# Patient Record
Sex: Female | Born: 1945
Health system: Southern US, Community
[De-identification: ages and names within clinical notes are randomized; demographics above are authoritative.]

## PROBLEM LIST (undated history)

## (undated) DIAGNOSIS — I10 Essential (primary) hypertension: Secondary | ICD-10-CM

## (undated) DIAGNOSIS — E785 Hyperlipidemia, unspecified: Secondary | ICD-10-CM

## (undated) DIAGNOSIS — Z923 Personal history of irradiation: Secondary | ICD-10-CM

## (undated) DIAGNOSIS — T4145XA Adverse effect of unspecified anesthetic, initial encounter: Secondary | ICD-10-CM

## (undated) DIAGNOSIS — T8859XA Other complications of anesthesia, initial encounter: Secondary | ICD-10-CM

## (undated) DIAGNOSIS — R112 Nausea with vomiting, unspecified: Secondary | ICD-10-CM

## (undated) DIAGNOSIS — C419 Malignant neoplasm of bone and articular cartilage, unspecified: Secondary | ICD-10-CM

## (undated) DIAGNOSIS — Z9889 Other specified postprocedural states: Secondary | ICD-10-CM

## (undated) DIAGNOSIS — E039 Hypothyroidism, unspecified: Secondary | ICD-10-CM

## (undated) DIAGNOSIS — C50919 Malignant neoplasm of unspecified site of unspecified female breast: Secondary | ICD-10-CM

## (undated) HISTORY — PX: BREAST BIOPSY: SHX20

## (undated) HISTORY — DX: Malignant neoplasm of bone and articular cartilage, unspecified: C41.9

## (undated) HISTORY — DX: Malignant neoplasm of unspecified site of unspecified female breast: C50.919

## (undated) HISTORY — PX: FOOT SURGERY: SHX648

## (undated) HISTORY — DX: Hyperlipidemia, unspecified: E78.5

## (undated) HISTORY — PX: BACK SURGERY: SHX140

---

## 2004-05-02 ENCOUNTER — Ambulatory Visit: Payer: Self-pay | Admitting: Unknown Physician Specialty

## 2004-05-23 ENCOUNTER — Ambulatory Visit: Payer: Self-pay | Admitting: Unknown Physician Specialty

## 2005-07-11 ENCOUNTER — Ambulatory Visit: Payer: Self-pay | Admitting: Unknown Physician Specialty

## 2005-08-07 ENCOUNTER — Ambulatory Visit: Payer: Self-pay | Admitting: Podiatry

## 2006-01-17 ENCOUNTER — Ambulatory Visit: Payer: Self-pay | Admitting: Podiatry

## 2006-07-15 ENCOUNTER — Ambulatory Visit: Payer: Self-pay | Admitting: Unknown Physician Specialty

## 2007-07-22 ENCOUNTER — Ambulatory Visit: Payer: Self-pay | Admitting: Unknown Physician Specialty

## 2008-08-02 ENCOUNTER — Ambulatory Visit: Payer: Self-pay | Admitting: Unknown Physician Specialty

## 2009-08-23 ENCOUNTER — Ambulatory Visit: Payer: Self-pay | Admitting: Unknown Physician Specialty

## 2010-08-06 ENCOUNTER — Ambulatory Visit: Payer: Self-pay | Admitting: Unknown Physician Specialty

## 2010-08-28 ENCOUNTER — Ambulatory Visit: Payer: Self-pay | Admitting: Unknown Physician Specialty

## 2011-08-29 ENCOUNTER — Ambulatory Visit: Payer: Self-pay | Admitting: Internal Medicine

## 2011-09-18 ENCOUNTER — Ambulatory Visit: Payer: Self-pay | Admitting: Unknown Physician Specialty

## 2011-09-20 LAB — PATHOLOGY REPORT

## 2012-08-31 ENCOUNTER — Ambulatory Visit: Payer: Self-pay | Admitting: Internal Medicine

## 2013-09-01 ENCOUNTER — Ambulatory Visit: Payer: Self-pay | Admitting: Internal Medicine

## 2013-10-06 ENCOUNTER — Ambulatory Visit: Payer: Self-pay | Admitting: Unknown Physician Specialty

## 2013-10-08 LAB — PATHOLOGY REPORT

## 2014-01-24 DIAGNOSIS — Z8739 Personal history of other diseases of the musculoskeletal system and connective tissue: Secondary | ICD-10-CM | POA: Insufficient documentation

## 2014-01-24 DIAGNOSIS — R1013 Epigastric pain: Secondary | ICD-10-CM | POA: Insufficient documentation

## 2014-07-29 ENCOUNTER — Other Ambulatory Visit: Payer: Self-pay | Admitting: Internal Medicine

## 2014-07-29 DIAGNOSIS — Z1231 Encounter for screening mammogram for malignant neoplasm of breast: Secondary | ICD-10-CM

## 2014-09-05 ENCOUNTER — Ambulatory Visit
Admission: RE | Admit: 2014-09-05 | Discharge: 2014-09-05 | Disposition: A | Discharge status: Home / Self Care | Payer: PPO | Source: Ambulatory Visit | Attending: Internal Medicine | Admitting: Internal Medicine

## 2014-09-05 DIAGNOSIS — Z1231 Encounter for screening mammogram for malignant neoplasm of breast: Secondary | ICD-10-CM | POA: Insufficient documentation

## 2015-07-27 ENCOUNTER — Other Ambulatory Visit: Payer: Self-pay | Admitting: Internal Medicine

## 2015-07-27 DIAGNOSIS — Z1231 Encounter for screening mammogram for malignant neoplasm of breast: Secondary | ICD-10-CM

## 2015-07-31 DIAGNOSIS — Z7982 Long term (current) use of aspirin: Secondary | ICD-10-CM | POA: Diagnosis not present

## 2015-07-31 DIAGNOSIS — E559 Vitamin D deficiency, unspecified: Secondary | ICD-10-CM | POA: Diagnosis not present

## 2015-07-31 DIAGNOSIS — R1013 Epigastric pain: Secondary | ICD-10-CM | POA: Diagnosis not present

## 2015-07-31 DIAGNOSIS — I10 Essential (primary) hypertension: Secondary | ICD-10-CM | POA: Diagnosis not present

## 2015-07-31 DIAGNOSIS — Z8739 Personal history of other diseases of the musculoskeletal system and connective tissue: Secondary | ICD-10-CM | POA: Diagnosis not present

## 2015-08-07 DIAGNOSIS — Z780 Asymptomatic menopausal state: Secondary | ICD-10-CM | POA: Diagnosis not present

## 2015-08-07 DIAGNOSIS — R946 Abnormal results of thyroid function studies: Secondary | ICD-10-CM | POA: Diagnosis not present

## 2015-08-07 DIAGNOSIS — E784 Other hyperlipidemia: Secondary | ICD-10-CM | POA: Diagnosis not present

## 2015-08-07 DIAGNOSIS — E559 Vitamin D deficiency, unspecified: Secondary | ICD-10-CM | POA: Diagnosis not present

## 2015-08-07 DIAGNOSIS — J3089 Other allergic rhinitis: Secondary | ICD-10-CM | POA: Diagnosis not present

## 2015-08-07 DIAGNOSIS — R1013 Epigastric pain: Secondary | ICD-10-CM | POA: Diagnosis not present

## 2015-08-07 DIAGNOSIS — Z7982 Long term (current) use of aspirin: Secondary | ICD-10-CM | POA: Diagnosis not present

## 2015-08-07 DIAGNOSIS — I10 Essential (primary) hypertension: Secondary | ICD-10-CM | POA: Diagnosis not present

## 2015-08-28 DIAGNOSIS — R946 Abnormal results of thyroid function studies: Secondary | ICD-10-CM | POA: Diagnosis not present

## 2015-08-28 DIAGNOSIS — Z780 Asymptomatic menopausal state: Secondary | ICD-10-CM | POA: Diagnosis not present

## 2015-08-28 DIAGNOSIS — I10 Essential (primary) hypertension: Secondary | ICD-10-CM | POA: Diagnosis not present

## 2015-09-11 ENCOUNTER — Other Ambulatory Visit: Payer: Self-pay | Admitting: Internal Medicine

## 2015-09-11 ENCOUNTER — Ambulatory Visit
Admission: RE | Admit: 2015-09-11 | Discharge: 2015-09-11 | Disposition: A | Discharge status: Home / Self Care | Payer: PPO | Source: Ambulatory Visit | Attending: Internal Medicine | Admitting: Internal Medicine

## 2015-09-11 DIAGNOSIS — Z1231 Encounter for screening mammogram for malignant neoplasm of breast: Secondary | ICD-10-CM

## 2015-09-26 DIAGNOSIS — H40153 Residual stage of open-angle glaucoma, bilateral: Secondary | ICD-10-CM | POA: Diagnosis not present

## 2016-01-26 DIAGNOSIS — D2271 Melanocytic nevi of right lower limb, including hip: Secondary | ICD-10-CM | POA: Diagnosis not present

## 2016-01-29 DIAGNOSIS — I10 Essential (primary) hypertension: Secondary | ICD-10-CM | POA: Diagnosis not present

## 2016-01-29 DIAGNOSIS — Z7982 Long term (current) use of aspirin: Secondary | ICD-10-CM | POA: Diagnosis not present

## 2016-02-05 DIAGNOSIS — E559 Vitamin D deficiency, unspecified: Secondary | ICD-10-CM | POA: Diagnosis not present

## 2016-02-05 DIAGNOSIS — Z0000 Encounter for general adult medical examination without abnormal findings: Secondary | ICD-10-CM | POA: Diagnosis not present

## 2016-02-05 DIAGNOSIS — R946 Abnormal results of thyroid function studies: Secondary | ICD-10-CM | POA: Diagnosis not present

## 2016-02-05 DIAGNOSIS — R1013 Epigastric pain: Secondary | ICD-10-CM | POA: Diagnosis not present

## 2016-02-05 DIAGNOSIS — E784 Other hyperlipidemia: Secondary | ICD-10-CM | POA: Diagnosis not present

## 2016-02-05 DIAGNOSIS — Z7982 Long term (current) use of aspirin: Secondary | ICD-10-CM | POA: Diagnosis not present

## 2016-02-05 DIAGNOSIS — I10 Essential (primary) hypertension: Secondary | ICD-10-CM | POA: Diagnosis not present

## 2016-02-12 DIAGNOSIS — H40153 Residual stage of open-angle glaucoma, bilateral: Secondary | ICD-10-CM | POA: Diagnosis not present

## 2016-03-05 DIAGNOSIS — R946 Abnormal results of thyroid function studies: Secondary | ICD-10-CM | POA: Diagnosis not present

## 2016-03-06 DIAGNOSIS — E034 Atrophy of thyroid (acquired): Secondary | ICD-10-CM | POA: Insufficient documentation

## 2016-06-10 DIAGNOSIS — H40153 Residual stage of open-angle glaucoma, bilateral: Secondary | ICD-10-CM | POA: Diagnosis not present

## 2016-06-10 DIAGNOSIS — H25813 Combined forms of age-related cataract, bilateral: Secondary | ICD-10-CM | POA: Diagnosis not present

## 2016-08-05 DIAGNOSIS — R1013 Epigastric pain: Secondary | ICD-10-CM | POA: Diagnosis not present

## 2016-08-05 DIAGNOSIS — Z7982 Long term (current) use of aspirin: Secondary | ICD-10-CM | POA: Diagnosis not present

## 2016-08-05 DIAGNOSIS — I10 Essential (primary) hypertension: Secondary | ICD-10-CM | POA: Diagnosis not present

## 2016-08-05 DIAGNOSIS — E559 Vitamin D deficiency, unspecified: Secondary | ICD-10-CM | POA: Diagnosis not present

## 2016-08-12 ENCOUNTER — Other Ambulatory Visit: Payer: Self-pay | Admitting: Internal Medicine

## 2016-08-12 DIAGNOSIS — Z23 Encounter for immunization: Secondary | ICD-10-CM | POA: Diagnosis not present

## 2016-08-12 DIAGNOSIS — E559 Vitamin D deficiency, unspecified: Secondary | ICD-10-CM | POA: Diagnosis not present

## 2016-08-12 DIAGNOSIS — E784 Other hyperlipidemia: Secondary | ICD-10-CM | POA: Diagnosis not present

## 2016-08-12 DIAGNOSIS — Z0000 Encounter for general adult medical examination without abnormal findings: Secondary | ICD-10-CM | POA: Diagnosis not present

## 2016-08-12 DIAGNOSIS — E034 Atrophy of thyroid (acquired): Secondary | ICD-10-CM | POA: Diagnosis not present

## 2016-08-12 DIAGNOSIS — I10 Essential (primary) hypertension: Secondary | ICD-10-CM | POA: Diagnosis not present

## 2016-08-12 DIAGNOSIS — Z7982 Long term (current) use of aspirin: Secondary | ICD-10-CM | POA: Diagnosis not present

## 2016-08-12 DIAGNOSIS — R1013 Epigastric pain: Secondary | ICD-10-CM | POA: Diagnosis not present

## 2016-08-12 DIAGNOSIS — Z1231 Encounter for screening mammogram for malignant neoplasm of breast: Secondary | ICD-10-CM | POA: Diagnosis not present

## 2016-09-11 ENCOUNTER — Ambulatory Visit
Admission: RE | Admit: 2016-09-11 | Discharge: 2016-09-11 | Disposition: A | Discharge status: Home / Self Care | Payer: PPO | Source: Ambulatory Visit | Attending: Internal Medicine | Admitting: Internal Medicine

## 2016-09-11 DIAGNOSIS — Z1231 Encounter for screening mammogram for malignant neoplasm of breast: Secondary | ICD-10-CM | POA: Diagnosis not present

## 2016-09-12 ENCOUNTER — Ambulatory Visit: Payer: PPO

## 2016-10-21 DIAGNOSIS — H40153 Residual stage of open-angle glaucoma, bilateral: Secondary | ICD-10-CM | POA: Diagnosis not present

## 2017-01-31 DIAGNOSIS — D2271 Melanocytic nevi of right lower limb, including hip: Secondary | ICD-10-CM | POA: Diagnosis not present

## 2017-01-31 DIAGNOSIS — D225 Melanocytic nevi of trunk: Secondary | ICD-10-CM | POA: Diagnosis not present

## 2017-01-31 DIAGNOSIS — D2261 Melanocytic nevi of right upper limb, including shoulder: Secondary | ICD-10-CM | POA: Diagnosis not present

## 2017-01-31 DIAGNOSIS — L821 Other seborrheic keratosis: Secondary | ICD-10-CM | POA: Diagnosis not present

## 2017-02-11 DIAGNOSIS — I10 Essential (primary) hypertension: Secondary | ICD-10-CM | POA: Diagnosis not present

## 2017-02-11 DIAGNOSIS — E034 Atrophy of thyroid (acquired): Secondary | ICD-10-CM | POA: Diagnosis not present

## 2017-02-11 DIAGNOSIS — E7849 Other hyperlipidemia: Secondary | ICD-10-CM | POA: Diagnosis not present

## 2017-02-11 DIAGNOSIS — Z0000 Encounter for general adult medical examination without abnormal findings: Secondary | ICD-10-CM | POA: Diagnosis not present

## 2017-02-11 DIAGNOSIS — R1013 Epigastric pain: Secondary | ICD-10-CM | POA: Diagnosis not present

## 2017-02-11 DIAGNOSIS — Z7982 Long term (current) use of aspirin: Secondary | ICD-10-CM | POA: Diagnosis not present

## 2017-02-18 DIAGNOSIS — E034 Atrophy of thyroid (acquired): Secondary | ICD-10-CM | POA: Diagnosis not present

## 2017-02-18 DIAGNOSIS — Z8739 Personal history of other diseases of the musculoskeletal system and connective tissue: Secondary | ICD-10-CM | POA: Diagnosis not present

## 2017-02-18 DIAGNOSIS — E7849 Other hyperlipidemia: Secondary | ICD-10-CM | POA: Diagnosis not present

## 2017-02-18 DIAGNOSIS — E559 Vitamin D deficiency, unspecified: Secondary | ICD-10-CM | POA: Diagnosis not present

## 2017-02-18 DIAGNOSIS — R1013 Epigastric pain: Secondary | ICD-10-CM | POA: Diagnosis not present

## 2017-02-18 DIAGNOSIS — I10 Essential (primary) hypertension: Secondary | ICD-10-CM | POA: Diagnosis not present

## 2017-04-15 DIAGNOSIS — H40153 Residual stage of open-angle glaucoma, bilateral: Secondary | ICD-10-CM | POA: Diagnosis not present

## 2017-07-22 DIAGNOSIS — H40153 Residual stage of open-angle glaucoma, bilateral: Secondary | ICD-10-CM | POA: Diagnosis not present

## 2017-08-19 ENCOUNTER — Other Ambulatory Visit: Payer: Self-pay | Admitting: Internal Medicine

## 2017-08-19 DIAGNOSIS — I10 Essential (primary) hypertension: Secondary | ICD-10-CM | POA: Diagnosis not present

## 2017-08-19 DIAGNOSIS — E7849 Other hyperlipidemia: Secondary | ICD-10-CM | POA: Diagnosis not present

## 2017-08-19 DIAGNOSIS — Z1231 Encounter for screening mammogram for malignant neoplasm of breast: Secondary | ICD-10-CM

## 2017-08-19 DIAGNOSIS — E034 Atrophy of thyroid (acquired): Secondary | ICD-10-CM | POA: Diagnosis not present

## 2017-08-19 DIAGNOSIS — Z8739 Personal history of other diseases of the musculoskeletal system and connective tissue: Secondary | ICD-10-CM | POA: Diagnosis not present

## 2017-08-19 DIAGNOSIS — R1013 Epigastric pain: Secondary | ICD-10-CM | POA: Diagnosis not present

## 2017-08-19 DIAGNOSIS — E559 Vitamin D deficiency, unspecified: Secondary | ICD-10-CM | POA: Diagnosis not present

## 2017-08-26 DIAGNOSIS — I10 Essential (primary) hypertension: Secondary | ICD-10-CM | POA: Diagnosis not present

## 2017-08-26 DIAGNOSIS — Z8739 Personal history of other diseases of the musculoskeletal system and connective tissue: Secondary | ICD-10-CM | POA: Diagnosis not present

## 2017-08-26 DIAGNOSIS — E7849 Other hyperlipidemia: Secondary | ICD-10-CM | POA: Diagnosis not present

## 2017-08-26 DIAGNOSIS — E034 Atrophy of thyroid (acquired): Secondary | ICD-10-CM | POA: Diagnosis not present

## 2017-08-26 DIAGNOSIS — F3342 Major depressive disorder, recurrent, in full remission: Secondary | ICD-10-CM | POA: Insufficient documentation

## 2017-08-26 DIAGNOSIS — Z0000 Encounter for general adult medical examination without abnormal findings: Secondary | ICD-10-CM | POA: Diagnosis not present

## 2017-08-26 DIAGNOSIS — Z780 Asymptomatic menopausal state: Secondary | ICD-10-CM | POA: Diagnosis not present

## 2017-08-26 DIAGNOSIS — R1013 Epigastric pain: Secondary | ICD-10-CM | POA: Diagnosis not present

## 2017-08-26 DIAGNOSIS — R748 Abnormal levels of other serum enzymes: Secondary | ICD-10-CM | POA: Diagnosis not present

## 2017-08-26 DIAGNOSIS — E559 Vitamin D deficiency, unspecified: Secondary | ICD-10-CM | POA: Diagnosis not present

## 2017-09-05 DIAGNOSIS — E559 Vitamin D deficiency, unspecified: Secondary | ICD-10-CM | POA: Diagnosis not present

## 2017-09-23 DIAGNOSIS — R748 Abnormal levels of other serum enzymes: Secondary | ICD-10-CM | POA: Diagnosis not present

## 2017-09-24 ENCOUNTER — Other Ambulatory Visit: Payer: Self-pay | Admitting: Internal Medicine

## 2017-09-24 DIAGNOSIS — R748 Abnormal levels of other serum enzymes: Secondary | ICD-10-CM

## 2017-10-03 ENCOUNTER — Ambulatory Visit: Payer: PPO

## 2017-10-06 ENCOUNTER — Ambulatory Visit: Payer: PPO

## 2017-10-06 ENCOUNTER — Ambulatory Visit
Admission: RE | Admit: 2017-10-06 | Discharge: 2017-10-06 | Disposition: A | Discharge status: Home / Self Care | Payer: PPO | Source: Ambulatory Visit | Attending: Internal Medicine | Admitting: Internal Medicine

## 2017-10-06 DIAGNOSIS — R748 Abnormal levels of other serum enzymes: Secondary | ICD-10-CM | POA: Diagnosis not present

## 2017-10-06 DIAGNOSIS — K760 Fatty (change of) liver, not elsewhere classified: Secondary | ICD-10-CM | POA: Insufficient documentation

## 2017-10-07 DIAGNOSIS — K760 Fatty (change of) liver, not elsewhere classified: Secondary | ICD-10-CM | POA: Insufficient documentation

## 2017-10-28 DIAGNOSIS — R748 Abnormal levels of other serum enzymes: Secondary | ICD-10-CM | POA: Diagnosis not present

## 2017-11-03 ENCOUNTER — Ambulatory Visit
Admission: RE | Admit: 2017-11-03 | Discharge: 2017-11-03 | Disposition: A | Discharge status: Home / Self Care | Payer: PPO | Source: Ambulatory Visit | Attending: Internal Medicine | Admitting: Internal Medicine

## 2017-11-03 DIAGNOSIS — Z1231 Encounter for screening mammogram for malignant neoplasm of breast: Secondary | ICD-10-CM | POA: Diagnosis not present

## 2017-11-06 ENCOUNTER — Other Ambulatory Visit: Payer: Self-pay | Admitting: Internal Medicine

## 2017-11-06 DIAGNOSIS — N6320 Unspecified lump in the left breast, unspecified quadrant: Secondary | ICD-10-CM

## 2017-11-06 DIAGNOSIS — R928 Other abnormal and inconclusive findings on diagnostic imaging of breast: Secondary | ICD-10-CM

## 2017-11-17 ENCOUNTER — Ambulatory Visit
Admission: RE | Admit: 2017-11-17 | Discharge: 2017-11-17 | Disposition: A | Discharge status: Home / Self Care | Payer: PPO | Source: Ambulatory Visit | Attending: Internal Medicine | Admitting: Internal Medicine

## 2017-11-17 DIAGNOSIS — R928 Other abnormal and inconclusive findings on diagnostic imaging of breast: Secondary | ICD-10-CM | POA: Insufficient documentation

## 2017-11-17 DIAGNOSIS — N6489 Other specified disorders of breast: Secondary | ICD-10-CM | POA: Diagnosis not present

## 2017-11-17 DIAGNOSIS — N6320 Unspecified lump in the left breast, unspecified quadrant: Secondary | ICD-10-CM | POA: Diagnosis not present

## 2017-11-19 ENCOUNTER — Other Ambulatory Visit: Payer: Self-pay | Admitting: Internal Medicine

## 2017-11-19 DIAGNOSIS — N6320 Unspecified lump in the left breast, unspecified quadrant: Secondary | ICD-10-CM

## 2017-11-19 DIAGNOSIS — R928 Other abnormal and inconclusive findings on diagnostic imaging of breast: Secondary | ICD-10-CM

## 2017-11-25 DIAGNOSIS — H40153 Residual stage of open-angle glaucoma, bilateral: Secondary | ICD-10-CM | POA: Diagnosis not present

## 2017-11-27 ENCOUNTER — Ambulatory Visit
Admission: RE | Admit: 2017-11-27 | Discharge: 2017-11-27 | Disposition: A | Discharge status: Home / Self Care | Payer: PPO | Source: Ambulatory Visit | Attending: Internal Medicine | Admitting: Internal Medicine

## 2017-11-27 DIAGNOSIS — N6321 Unspecified lump in the left breast, upper outer quadrant: Secondary | ICD-10-CM | POA: Diagnosis not present

## 2017-11-27 DIAGNOSIS — R928 Other abnormal and inconclusive findings on diagnostic imaging of breast: Secondary | ICD-10-CM

## 2017-11-27 DIAGNOSIS — N6320 Unspecified lump in the left breast, unspecified quadrant: Secondary | ICD-10-CM | POA: Diagnosis not present

## 2017-11-27 DIAGNOSIS — C50412 Malignant neoplasm of upper-outer quadrant of left female breast: Secondary | ICD-10-CM | POA: Diagnosis not present

## 2017-12-02 ENCOUNTER — Other Ambulatory Visit: Payer: Self-pay

## 2017-12-02 ENCOUNTER — Other Ambulatory Visit: Payer: Self-pay | Admitting: Anatomic Pathology & Clinical Pathology

## 2017-12-02 DIAGNOSIS — C50412 Malignant neoplasm of upper-outer quadrant of left female breast: Secondary | ICD-10-CM | POA: Diagnosis not present

## 2017-12-02 DIAGNOSIS — C50919 Malignant neoplasm of unspecified site of unspecified female breast: Secondary | ICD-10-CM

## 2017-12-02 LAB — SURGICAL PATHOLOGY

## 2017-12-02 NOTE — Progress Notes (Signed)
  Oncology Nurse Navigator Documentation  Navigator Location: [facility] (12/02/17 1100)   )Navigator Encounter Type: Introductory phone call (12/02/17 1100)   Abnormal Finding Date: 11/17/17 (12/02/17 1100) Confirmed Diagnosis Date: 11/27/17 (12/02/17 1100)               Patient Visit Type: Initial (12/02/17 1100) Treatment Phase: Pre-Tx/Tx Discussion (12/02/17 1100) Barriers/Navigation Needs: Coordination of Care;Education (12/02/17 1100) Education: Accessing Care/ Finding Providers;Coping with Diagnosis/ Prognosis;Newly Diagnosed Cancer Education (12/02/17 1100) Interventions: Coordination of Care;Education (12/02/17 1100)   Coordination of Care: Appts (12/02/17 1100) Education Method: Written;Verbal;Teach-back (12/02/17 1100)                Time Spent with Patient: 60 (12/02/17 1100)  Phoned patient to introduce Navigation Service.  Patient states she has appointment this afternoon with Dr. [name].  Took Breast Cancer Treatment Handbook/folder with hospital services to [facility].  Scheduled with Dr. Janese Banks on 12/05/17 at 1:00.  PAtient ,and Dr. Caryl Comes notified.

## 2017-12-03 ENCOUNTER — Other Ambulatory Visit: Payer: Self-pay | Admitting: Surgery

## 2017-12-03 ENCOUNTER — Ambulatory Visit: Payer: Self-pay | Admitting: Surgery

## 2017-12-03 DIAGNOSIS — C50412 Malignant neoplasm of upper-outer quadrant of left female breast: Secondary | ICD-10-CM

## 2017-12-03 NOTE — H&P (Signed)
Subjective:   CC: Malignant neoplasm of upper-outer quadrant of left female breast, unspecified estrogen receptor status (CMS-HCC) [C50.412] HPI:  Christy Braun is a 72 y.o. female who was referred by Cheryll Cockayne, MD for evaluation of breast mass. Change was noted on previous screening mammogram. Patient does routinely do self breast exams.  Patient has not noted a change on breast exam. Age of menarche was 70. Age of menopause was year ago. Patient reports 2-58motrial of hormonal therapy. Patient is G2P2. Patient did not breast feed. Patient denies nipple discharge. Patient denies previous breast biopsy. Patient denies a personal history of breast cancer.   Past Medical History:  has a past medical history of Adenomatous colon polyp, unspecified (2013), Allergic rhinitis, Dyspepsia (01/24/2014), Elevated liver enzymes (01/24/2014), Fibrocystic breast disease, H/O mammogram (08/28/2010), Hepatic steatosis (10/07/2017), History of bone density study (07/17/2011), Hyperlipidemia, Hypertension, Hypothyroidism due to acquired atrophy of thyroid (03/06/2016), and Vitamin D deficiency.  Past Surgical History:  has a past surgical history that includes Colonoscopy (05/23/2004); Colonoscopy (09/18/2011); egd (09/18/2011); Colonoscopy (10/06/2013); Appendectomy; Tubal ligation; Lumbar laminectomy and right radial keratotomy; and Multiple foot surgeries.  Family History: family history includes Coronary Artery Disease (Blocked arteries around heart) in her father; Hyperlipidemia (Elevated cholesterol) in her mother. PRIMARY OVARIAN CANCER-MOTHER  Social History:  reports that she has quit smoking. She has never used smokeless tobacco. She reports that she drinks alcohol. She reports that she does not use drugs.  Current Medications: has a current medication list which includes the following prescription(s): aspirin, atorvastatin, calcium carbonate-vitamin d3, cholecalciferol, levothyroxine,  losartan-hydrochlorothiazide, azithromycin, citalopram, and latanoprost.  Allergies:       Allergies as of 12/02/2017 - Reviewed 12/02/2017  Allergen Reaction Noted  . Penicillins Swelling and Other (See Comments) 01/18/2014  . Percocet [oxycodone-acetaminophen] Other (See Comments) 01/18/2014    ROS:  A 15 point review of systems was performed and was negative except as noted in HPI   Objective:   BP 134/81   Pulse 104   Temp 37.1 C (98.8 F) (Oral)   Ht 162.6 cm (5' 4")   Wt 63.5 kg (140 lb)   BMI 24.03 kg/m   Constitutional :  alert, appears stated age, cooperative and no distress  Lymphatics/Throat:  no asymmetry, masses, or scars  Respiratory:  clear to auscultation bilaterally  Cardiovascular:  regular rate and rhythm  Gastrointestinal: soft, non-tender; bowel sounds normal; no masses,  no organomegaly.   Musculoskeletal: Steady gait and movement  Skin: Cool and moist, no surgical scars.  Small non-tender nodule noted on back of left thigh, slightly proximal to knee joint, smooth and firm, superficial.  Similar less, firm noudles noted along varicose veins in the lower extremity.  Psychiatric: Normal affect, non-agitated, not confused  Breast:  Chaperone present for exam.  breasts appear normal, no suspicious masses, no skin or nipple changes or axillary nodes.    LABS:  Surgical Pathology CASE: [id number]: MDomenic SchwabSurgical Pathology Report     SPECIMEN SUBMITTED: A. Breast, left, 2 o'clock; biopsy  CLINICAL HISTORY: Screening detected mass left breast at 2 o'clock, 4 mm by ultrasound  PRE-OPERATIVE DIAGNOSIS: None provided  POST-OPERATIVE DIAGNOSIS: None provided.     DIAGNOSIS: A. BREAST, LEFT, 2 O'CLOCK 8 CM FROM NIPPLE; ULTRASOUND-GUIDED CORE BIOPSY: - INVASIVE MAMMARY CARCINOMA WITH FEATURES OF MUCINOUS CARCINOMA.  Size of invasive carcinoma: 4 mm in this sample Histologic grade of invasive carcinoma: Grade  1  Glandular/tubular differentiation score: 3  Nuclear pleomorphism  score: 1  Mitotic rate score: 1  Total score: 5 Ductal carcinoma in situ: Present, low-grade without necrosis Lymphovascular invasion: Not identified  ER/PR/HER2: Immunohistochemistry will be performed on block A1, with reflex to FISH for HER2 2+. The results will be reported in an addendum.  Comment: The definitive tumor classification and grade will be assigned on the excisional specimen. These findings were communicated to Christy Braun in Dr. Dimitrova's office on 11/28/2017 at 1:28 PM. Read back procedure was performed.   GROSS DESCRIPTION: A. Labeled: Left breast 2:00, 8 cm from nipple Received: In formalin Time/date in fixative: 8:58 AM on 11/27/2017 Cold ischemic time: Less than 1 minute Total fixation time: 8 hours Core pieces: Multiple Size: Aggregate, 1.1 x 0.6 x 0.1 cm Description: Yellow to tan fibrous and fatty fragments Ink color: Black Entirely submitted in one cassette.     Final Diagnosis performed by Liannah Olney, MD. Electronically signed 11/28/2017 3:23:29PM The electronic signature indicates that the named Attending Pathologist has evaluated the specimen  Technical component performed at [facility], [address], [place], [place] [address] Lab: [phone] Dir: Sanjai Nagendra, MD, MMMProfessional component performed at [facility], [facility], [address], [place], [place] [address] Lab: [phone] Dir: Tara C. Rubinas, MD  RADS: CLINICAL DATA:Recall from screening mammography with  tomosynthesis, possible mass involving the OUTER LEFT breast at  POSTERIOR depth.    EXAM:  DIGITAL DIAGNOSTIC LEFT MAMMOGRAM WITH TOMO    ULTRASOUND LEFT BREAST    COMPARISON:Mammography 11/03/2017, 09/11/2016 and earlier. No  prior ultrasound.    ACR Breast Density Category b:  There are scattered areas of  fibroglandular density.    FINDINGS:  Tomosynthesis and synthesized spot-compression CC and MLO views of  the area of concern in the LEFT breast were obtained.    Spot compression images confirm a mass measuring approximately 5 mm  in the OUTER breast at POSTERIOR depth, associated with vague  spiculation/architectural distortion. There are no associated  suspicious calcifications.    On physical exam, there is no palpable abnormality in the UPPER  OUTER LEFT breast.    Targeted LEFT breast ultrasound is performed, showing a hypoechoic  mass with irregular margins the 2 o'clock position approximately 8  cm nipple at POSTERIOR depth measuring approximately 3 x 4 x 3 mm,  demonstrating no posterior characteristics and no internal power  Doppler flow, corresponding to the screening mammographic finding.    Sonographic evaluation of the LEFT axilla demonstrates no pathologic  lymphadenopathy.    IMPRESSION:  1. Suspicious 4 mm mass involving the UPPER OUTER QUADRANT of the  LEFT breast at POSTERIOR depth.  2. No pathologic LEFT axillary lymphadenopathy.    RECOMMENDATION:  Ultrasound-guided core needle biopsy of the suspicious LEFT breast  mass.    The ultrasound biopsy procedure was discussed with the patient and  her questions were answered. She has agreed to proceed and the  biopsy will be scheduled at her convenience.    I have discussed the findings and recommendations with the patient.  Results were also provided in writing at the conclusion of the  visit.    BI-RADS CATEGORY4: Suspicious.      Electronically Signed  By: ThomasLawrence M.D.  On: 11/17/2017 16:10   Assessment:   Malignant neoplasm of upper-outer quadrant of left female breast, unspecified estrogen receptor status (CMS-HCC) [C50.412]  Plan:   1. Malignant neoplasm of upper-outer quadrant of left female  breast, unspecified estrogen receptor status (CMS-HCC) [C50.412]  Discussed the risk of   surgery including recurrence, chronic pain, post-op infxn, poor/delayed wound healing, poor cosmesis, seroma, hematoma formation, and possible re-operation to address said risks. The risks of general anesthetic, if used, includes MI, CVA, sudden death or even reaction to anesthetic medications also discussed.  Typical post-op recovery time and possbility of activity restrictions were also discussed.  Alternatives include continued observation.  Benefits include possible symptom relief, pathologic evaluation, and/or curative excision.   The patient verbalized understanding and all questions were answered to the patient's satisfaction.  2. Patient has elected to proceed with surgical treatment. Procedure will be scheduled.  Will await oncology recs for any additional testing, needed prior to surgery.  May need to consider genetic testing due mother's history of ovarian cancer.  Written consent was obtained.     Electronically signed by Benjamine Sprague, DO on 12/02/2017 5:17 PM

## 2017-12-03 NOTE — H&P (View-Only) (Signed)
Subjective:   CC: Malignant neoplasm of upper-outer quadrant of left female breast, unspecified estrogen receptor status (CMS-HCC) [C50.412] HPI:  Christy Braun is a 72 y.o. female who was referred by Christy Cockayne, MD for evaluation of breast mass. Change was noted on previous screening mammogram. Patient does routinely do self breast exams.  Patient has not noted a change on breast exam. Age of menarche was 18. Age of menopause was year ago. Patient reports 2-51motrial of hormonal therapy. Patient is G2P2. Patient did not breast feed. Patient denies nipple discharge. Patient denies previous breast biopsy. Patient denies a personal history of breast cancer.   Past Medical History:  has a past medical history of Adenomatous colon polyp, unspecified (2013), Allergic rhinitis, Dyspepsia (01/24/2014), Elevated liver enzymes (01/24/2014), Fibrocystic breast disease, H/O mammogram (08/28/2010), Hepatic steatosis (10/07/2017), History of bone density study (07/17/2011), Hyperlipidemia, Hypertension, Hypothyroidism due to acquired atrophy of thyroid (03/06/2016), and Vitamin D deficiency.  Past Surgical History:  has a past surgical history that includes Colonoscopy (05/23/2004); Colonoscopy (09/18/2011); egd (09/18/2011); Colonoscopy (10/06/2013); Appendectomy; Tubal ligation; Lumbar laminectomy and right radial keratotomy; and Multiple foot surgeries.  Family History: family history includes Coronary Artery Disease (Blocked arteries around heart) in her father; Hyperlipidemia (Elevated cholesterol) in her mother. PRIMARY OVARIAN CANCER-MOTHER  Social History:  reports that she has quit smoking. She has never used smokeless tobacco. She reports that she drinks alcohol. She reports that she does not use drugs.  Current Medications: has a current medication list which includes the following prescription(s): aspirin, atorvastatin, calcium carbonate-vitamin d3, cholecalciferol, levothyroxine,  losartan-hydrochlorothiazide, azithromycin, citalopram, and latanoprost.  Allergies:       Allergies as of 12/02/2017 - Reviewed 12/02/2017  Allergen Reaction Noted  . Penicillins Swelling and Other (See Comments) 01/18/2014  . Percocet [oxycodone-acetaminophen] Other (See Comments) 01/18/2014    ROS:  A 15 point review of systems was performed and was negative except as noted in HPI   Objective:   BP 134/81   Pulse 104   Temp 37.1 C (98.8 F) (Oral)   Ht 162.6 cm (5' 4")   Wt 63.5 kg (140 lb)   BMI 24.03 kg/m   Constitutional :  alert, appears stated age, cooperative and no distress  Lymphatics/Throat:  no asymmetry, masses, or scars  Respiratory:  clear to auscultation bilaterally  Cardiovascular:  regular rate and rhythm  Gastrointestinal: soft, non-tender; bowel sounds normal; no masses,  no organomegaly.   Musculoskeletal: Steady gait and movement  Skin: Cool and moist, no surgical scars.  Small non-tender nodule noted on back of left thigh, slightly proximal to knee joint, smooth and firm, superficial.  Similar less, firm noudles noted along varicose veins in the lower extremity.  Psychiatric: Normal affect, non-agitated, not confused  Breast:  Chaperone present for exam.  breasts appear normal, no suspicious masses, no skin or nipple changes or axillary nodes.    LABS:  Surgical Pathology CASE: [id number]: Christy SchwabSurgical Pathology Report     SPECIMEN SUBMITTED: A. Breast, left, 2 o'clock; biopsy  CLINICAL HISTORY: Screening detected mass left breast at 2 o'clock, 4 mm by ultrasound  PRE-OPERATIVE DIAGNOSIS: None provided  POST-OPERATIVE DIAGNOSIS: None provided.     DIAGNOSIS: A. BREAST, LEFT, 2 O'CLOCK 8 CM FROM NIPPLE; ULTRASOUND-GUIDED CORE BIOPSY: - INVASIVE MAMMARY CARCINOMA WITH FEATURES OF MUCINOUS CARCINOMA.  Size of invasive carcinoma: 4 mm in this sample Histologic grade of invasive carcinoma: Grade  1  Glandular/tubular differentiation score: 3  Nuclear pleomorphism  score: 1  Mitotic rate score: 1  Total score: 5 Ductal carcinoma in situ: Present, low-grade without necrosis Lymphovascular invasion: Not identified  ER/PR/Braun: Immunohistochemistry will be performed on block A1, with reflex to Christy Stanwood for Braun 2+. The results will be reported in an addendum.  Comment: The definitive tumor classification and grade will be assigned on the excisional specimen. These findings were communicated to Christy Braun in Christy Braun office on 11/28/2017 at 1:28 PM. Read back procedure was performed.   GROSS DESCRIPTION: A. Labeled: Left breast 2:00, 8 cm from nipple Received: In formalin Time/date in fixative: 8:58 AM on 11/27/2017 Cold ischemic time: Less than 1 minute Total fixation time: 8 hours Core pieces: Multiple Size: Aggregate, 1.1 x 0.6 x 0.1 cm Description: Yellow to tan fibrous and fatty fragments Ink color: Black Entirely submitted in one cassette.     Final Diagnosis performed by Christy Lemma, MD. Electronically signed 11/28/2017 3:23:29PM The electronic signature indicates that the named Christy Braun has evaluated the specimen  Technical component performed at [facility], [address] [place], [place] [address] Lab: [phone] Dir: Rush Farmer, MD, MMMProfessional component performed at [facility], [facility], [place], [place], [place] [address] Lab: [phone] Dir: Dellia Nims. Rubinas, MD  RADS: CLINICAL DATA:Recall from screening mammography with  tomosynthesis, possible mass involving the OUTER LEFT breast at  POSTERIOR depth.    EXAM:  DIGITAL DIAGNOSTIC LEFT MAMMOGRAM WITH TOMO    ULTRASOUND LEFT BREAST    COMPARISON:Mammography 11/03/2017, 09/11/2016 and earlier. No  prior ultrasound.    ACR Breast Density Category b:  There are scattered areas of  fibroglandular density.    FINDINGS:  Tomosynthesis and synthesized spot-compression CC and MLO views of  the area of concern in the LEFT breast were obtained.    Spot compression images confirm a mass measuring approximately 5 mm  in the OUTER breast at POSTERIOR depth, associated with vague  spiculation/architectural distortion. There are no associated  suspicious calcifications.    On physical exam, there is no palpable abnormality in the UPPER  OUTER LEFT breast.    Targeted LEFT breast ultrasound is performed, showing a hypoechoic  mass with irregular margins the 2 o'clock position approximately 8  cm nipple at POSTERIOR depth measuring approximately 3 x 4 x 3 mm,  demonstrating no posterior characteristics and no internal power  Doppler flow, corresponding to the screening mammographic finding.    Sonographic evaluation of the LEFT axilla demonstrates no pathologic  lymphadenopathy.    IMPRESSION:  1. Suspicious 4 mm mass involving the UPPER OUTER QUADRANT of the  LEFT breast at POSTERIOR depth.  2. No pathologic LEFT axillary lymphadenopathy.    RECOMMENDATION:  Ultrasound-guided core needle biopsy of the suspicious LEFT breast  mass.    The ultrasound biopsy procedure was discussed with the patient and  her questions were answered. She has agreed to proceed and the  biopsy will be scheduled at her convenience.    I have discussed the findings and recommendations with the patient.  Results were also provided in writing at the conclusion of the  visit.    BI-RADS CATEGORY4: Suspicious.      Electronically Signed  ByHaydee Monica M.D.  On: 11/17/2017 16:10   Assessment:   Malignant neoplasm of upper-outer quadrant of left female breast, unspecified estrogen receptor status (CMS-HCC) [C50.412]  Plan:   1. Malignant neoplasm of upper-outer quadrant of left female  breast, unspecified estrogen receptor status (CMS-HCC) [C50.412]  Discussed the risk of  surgery including recurrence, chronic pain, post-op infxn, poor/delayed wound healing, poor cosmesis, seroma, hematoma formation, and possible re-operation to address said risks. The risks of general anesthetic, if used, includes MI, CVA, sudden death or even reaction to anesthetic medications also discussed.  Typical post-op recovery time and possbility of activity restrictions were also discussed.  Alternatives include continued observation.  Benefits include possible symptom relief, pathologic evaluation, and/or curative excision.   The patient verbalized understanding and all questions were answered to the patient's satisfaction.  2. Patient has elected to proceed with surgical treatment. Procedure will be scheduled.  Will await oncology recs for any additional testing, needed prior to surgery.  May need to consider genetic testing due mother's history of ovarian cancer.  Written consent was obtained.     Electronically signed by Benjamine Sprague, DO on 12/02/2017 5:17 PM

## 2017-12-04 ENCOUNTER — Other Ambulatory Visit: Payer: Self-pay | Admitting: Surgery

## 2017-12-04 DIAGNOSIS — C50412 Malignant neoplasm of upper-outer quadrant of left female breast: Secondary | ICD-10-CM

## 2017-12-05 ENCOUNTER — Encounter: Payer: Self-pay | Admitting: Oncology

## 2017-12-05 ENCOUNTER — Inpatient Hospital Stay: Payer: PPO | Attending: Oncology | Admitting: Oncology

## 2017-12-05 ENCOUNTER — Encounter (INDEPENDENT_AMBULATORY_CARE_PROVIDER_SITE_OTHER): Payer: Self-pay

## 2017-12-05 VITALS — BP 92/48 | HR 101 | Temp 100.3°F | Resp 18 | Ht 65.0 in | Wt 140.5 lb

## 2017-12-05 DIAGNOSIS — Z7189 Other specified counseling: Secondary | ICD-10-CM

## 2017-12-05 DIAGNOSIS — E785 Hyperlipidemia, unspecified: Secondary | ICD-10-CM | POA: Insufficient documentation

## 2017-12-05 DIAGNOSIS — J309 Allergic rhinitis, unspecified: Secondary | ICD-10-CM | POA: Insufficient documentation

## 2017-12-05 DIAGNOSIS — C50919 Malignant neoplasm of unspecified site of unspecified female breast: Secondary | ICD-10-CM

## 2017-12-05 DIAGNOSIS — Z170 Estrogen receptor positive status [ER+]: Secondary | ICD-10-CM | POA: Diagnosis not present

## 2017-12-05 DIAGNOSIS — Z7982 Long term (current) use of aspirin: Secondary | ICD-10-CM

## 2017-12-05 DIAGNOSIS — N6019 Diffuse cystic mastopathy of unspecified breast: Secondary | ICD-10-CM | POA: Insufficient documentation

## 2017-12-05 DIAGNOSIS — C50911 Malignant neoplasm of unspecified site of right female breast: Secondary | ICD-10-CM | POA: Diagnosis not present

## 2017-12-05 DIAGNOSIS — I10 Essential (primary) hypertension: Secondary | ICD-10-CM | POA: Insufficient documentation

## 2017-12-05 DIAGNOSIS — E559 Vitamin D deficiency, unspecified: Secondary | ICD-10-CM | POA: Insufficient documentation

## 2017-12-05 NOTE — Progress Notes (Signed)
Hematology/Oncology Consult note [facility] [phone] [phone]  Patient Care Team: Adin Hector, MD as PCP - General (Internal Medicine)   Name of the patient: Christy Braun  [id number]  [birth year]    Reason for referral- new diagnosis of breast cancer   Referring physician- Dr. Caryl Comes  Date of visit: 12/05/17   History of presenting illness-patient is a 72 year old female who underwent Bilateral screening mammogram in August 2019 which showed a possible mass in the left breast.  Diagnostic mammogram and ultrasound of the left breast showed 3 x 4 x 3 mm hypoechoic mass at the 2 o'clock position.  No pathologic left axillary adenopathy.  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  Patient has seen Dr. [name] and will be undergoing lumpectomy soon.  No prior history of breast cancer or breast biopsies.  Menarche at the age of 64.  Menopause was close to 10 years ago.  She is G2, P2.  She did not breast-feed.  No family history of breast, colon, pancreatic cancer.  States that her mother had some cancer and she is not sure if it was ovarian cancer   ECOG PS- 1  Pain scale- 0   Review of systems- Review of Systems  Constitutional: Negative for chills, fever, malaise/fatigue and weight loss.  HENT: Negative for congestion, ear discharge and nosebleeds.   Eyes: Negative for blurred vision.  Respiratory: Negative for cough, hemoptysis, sputum production, shortness of breath and wheezing.   Cardiovascular: Negative for chest pain, palpitations, orthopnea and claudication.  Gastrointestinal: Negative for abdominal pain, blood in stool, constipation, diarrhea, heartburn, melena, nausea and vomiting.  Genitourinary: Negative for dysuria, flank pain, frequency, hematuria and urgency.  Musculoskeletal: Negative for back pain, joint  pain and myalgias.  Skin: Negative for rash.  Neurological: Negative for dizziness, tingling, focal weakness, seizures, weakness and headaches.  Endo/Heme/Allergies: Does not bruise/bleed easily.  Psychiatric/Behavioral: Negative for depression and suicidal ideas. The patient does not have insomnia.     Allergies  Allergen Reactions  . Penicillins Hives, Swelling and Other (See Comments)    "knots" on legs Has patient had a PCN reaction causing immediate rash, facial/tongue/throat swelling, SOB or lightheadedness with hypotension: No Has patient had a PCN reaction causing severe rash involving mucus membranes or skin necrosis: No Has patient had a PCN reaction that required hospitalization: No Has patient had a PCN reaction occurring within the last 10 years: No If all of the above answers are "NO", then may proceed with Cephalosporin use.    [name] Oxycodone-Acetaminophen Anxiety and Other (See Comments)    confusion    There are no active problems to display for this patient.    No past medical history on file.   Past Surgical History:  Procedure Laterality Date  . BREAST BIOPSY Left 9/5/20019   u/s core bc path pending    Social History   Socioeconomic History  . Marital status: Married    Spouse name: Not on file  . Number of children: Not on file  . Years of education: Not on file  . Highest education level: Not on file  Occupational History  . Not on file  Social Needs  . Financial resource strain: Not on file  . Food insecurity:    Worry: Not on file    Inability: Not on file  . Transportation needs:    Medical:  Not on file    Non-medical: Not on file  Tobacco Use  . Smoking status: Not on file  Substance and Sexual Activity  . Alcohol use: Not on file  . Drug use: Not on file  . Sexual activity: Not on file  Lifestyle  . Physical activity:    Days per week: Not on file    Minutes per session: Not on file  . Stress: Not on file  Relationships  .  Social connections:    Talks on phone: Not on file    Gets together: Not on file    Attends religious service: Not on file    Active member of club or organization: Not on file    Attends meetings of clubs or organizations: Not on file    Relationship status: Not on file  . Intimate partner violence:    Fear of current or ex partner: Not on file    Emotionally abused: Not on file    Physically abused: Not on file    Forced sexual activity: Not on file  Other Topics Concern  . Not on file  Social History Narrative  . Not on file     Family History  Problem Relation Age of Onset  . Breast cancer Neg Hx      Current Outpatient Medications:  .  aspirin EC 81 MG tablet, Take 81 mg by mouth daily., Disp: , Rfl:  .  atorvastatin (LIPITOR) 80 MG tablet, Take 80 mg by mouth daily., Disp: , Rfl:  .  Cholecalciferol (VITAMIN D3) 1000 units CAPS, Take 1,000 Units by mouth 2 (two) times daily., Disp: , Rfl:  .  citalopram (CELEXA) 20 MG tablet, Take 20 mg by mouth daily., Disp: , Rfl:  .  latanoprost (XALATAN) 0.005 % ophthalmic solution, Place 1 drop into both eyes at bedtime., Disp: , Rfl:  .  levothyroxine (SYNTHROID, LEVOTHROID) 50 MCG tablet, Take 50 mcg by mouth daily before breakfast., Disp: , Rfl:  .  losartan-hydrochlorothiazide (HYZAAR) 50-12.5 MG tablet, Take 1 tablet by mouth daily., Disp: , Rfl:  .  Multiple Vitamin (MULTIVITAMIN WITH MINERALS) TABS tablet, Take 1 tablet by mouth daily., Disp: , Rfl:    Physical exam:  Vitals:   12/05/17 1316  BP: (!) 92/48  Pulse: (!) 101  Resp: 18  Temp: 100.3 F (37.9 C)  TempSrc: Tympanic  Weight: 140 lb 8 oz (63.7 kg)  Height: 5' 5"  (1.651 m)   Physical Exam  Constitutional: She is oriented to person, place, and time. She appears well-developed and well-nourished.  HENT:  Head: Normocephalic and atraumatic.  Eyes: Pupils are equal, round, and reactive to light. EOM are normal.  Neck: Normal range of motion.  Cardiovascular:  Normal rate, regular rhythm and normal heart sounds.  Pulmonary/Chest: Effort normal and breath sounds normal.  Abdominal: Soft. Bowel sounds are normal.  Neurological: She is alert and oriented to person, place, and time.  Skin: Skin is warm and dry.  No palpable breast masses in either breast.  No palpable bilateral axillary adenopathy     No flowsheet data found. No flowsheet data found.  No images are attached to the encounter.  US Breast Ltd Uni Left Inc Axilla  Result Date: 11/17/2017 CLINICAL DATA:  Recall from screening mammography with tomosynthesis, possible mass involving the OUTER LEFT breast at POSTERIOR depth. EXAM: DIGITAL DIAGNOSTIC LEFT MAMMOGRAM WITH TOMO ULTRASOUND LEFT BREAST COMPARISON:  Mammography 11/03/2017, 09/11/2016 and earlier. No prior ultrasound. ACR Breast Density Category b:  There are scattered areas of fibroglandular density. FINDINGS: Tomosynthesis and synthesized spot-compression CC and MLO views of the area of concern in the LEFT breast were obtained. Spot compression images confirm a mass measuring approximately 5 mm in the OUTER breast at POSTERIOR depth, associated with vague spiculation/architectural distortion. There are no associated suspicious calcifications. On physical exam, there is no palpable abnormality in the UPPER OUTER LEFT breast. Targeted LEFT breast ultrasound is performed, showing a hypoechoic mass with irregular margins the 2 o'clock position approximately 8 cm nipple at POSTERIOR depth measuring approximately 3 x 4 x 3 mm, demonstrating no posterior characteristics and no internal power Doppler flow, corresponding to the screening mammographic finding. Sonographic evaluation of the LEFT axilla demonstrates no pathologic lymphadenopathy. IMPRESSION: 1. Suspicious 4 mm mass involving the UPPER OUTER QUADRANT of the LEFT breast at POSTERIOR depth. 2. No pathologic LEFT axillary lymphadenopathy. RECOMMENDATION: Ultrasound-guided core needle biopsy  of the suspicious LEFT breast mass. The ultrasound biopsy procedure was discussed with the patient and her questions were answered. She has agreed to proceed and the biopsy will be scheduled at her convenience. I have discussed the findings and recommendations with the patient. Results were also provided in writing at the conclusion of the visit. BI-RADS CATEGORY  4: Suspicious. Electronically Signed   By: Evangeline Dakin M.D.   On: 11/17/2017 16:10   Mm Diag Breast Tomo Uni Left  Result Date: 11/17/2017 CLINICAL DATA:  Recall from screening mammography with tomosynthesis, possible mass involving the OUTER LEFT breast at POSTERIOR depth. EXAM: DIGITAL DIAGNOSTIC LEFT MAMMOGRAM WITH TOMO ULTRASOUND LEFT BREAST COMPARISON:  Mammography 11/03/2017, 09/11/2016 and earlier. No prior ultrasound. ACR Breast Density Category b: There are scattered areas of fibroglandular density. FINDINGS: Tomosynthesis and synthesized spot-compression CC and MLO views of the area of concern in the LEFT breast were obtained. Spot compression images confirm a mass measuring approximately 5 mm in the OUTER breast at POSTERIOR depth, associated with vague spiculation/architectural distortion. There are no associated suspicious calcifications. On physical exam, there is no palpable abnormality in the UPPER OUTER LEFT breast. Targeted LEFT breast ultrasound is performed, showing a hypoechoic mass with irregular margins the 2 o'clock position approximately 8 cm nipple at POSTERIOR depth measuring approximately 3 x 4 x 3 mm, demonstrating no posterior characteristics and no internal power Doppler flow, corresponding to the screening mammographic finding. Sonographic evaluation of the LEFT axilla demonstrates no pathologic lymphadenopathy. IMPRESSION: 1. Suspicious 4 mm mass involving the UPPER OUTER QUADRANT of the LEFT breast at POSTERIOR depth. 2. No pathologic LEFT axillary lymphadenopathy. RECOMMENDATION: Ultrasound-guided core needle  biopsy of the suspicious LEFT breast mass. The ultrasound biopsy procedure was discussed with the patient and her questions were answered. She has agreed to proceed and the biopsy will be scheduled at her convenience. I have discussed the findings and recommendations with the patient. Results were also provided in writing at the conclusion of the visit. BI-RADS CATEGORY  4: Suspicious. Electronically Signed   By: Evangeline Dakin M.D.   On: 11/17/2017 16:10   Mm Clip Placement Left  Result Date: 11/27/2017 CLINICAL DATA:  Post ultrasound-guided core needle biopsy of left breast 2 o'clock nodule. EXAM: DIAGNOSTIC LEFT MAMMOGRAM POST ULTRASOUND BIOPSY COMPARISON:  Previous exam(s). FINDINGS: Mammographic images were obtained following ultrasound guided biopsy of left breast 2 o'clock nodule. Two-view mammography demonstrates presence of ribbon shaped marker within the biopsy site in appropriate mammographic position. IMPRESSION: Successful placement of ribbon shaped marker post ultrasound-guided core needle biopsy of left  breast 2 o'clock nodule. Final Assessment: Post Procedure Mammograms for Marker Placement Electronically Signed   By: Fidela Salisbury M.D.   On: 11/27/2017 12:21   [place] Lt Breast Bx W Loc Dev 1st Lesion Img Bx Spec US Guide  Addendum Date: 12/01/2017   ADDENDUM REPORT: 12/01/2017 14:51 ADDENDUM: Pathology of the left breast biopsy revealed A. BREAST, LEFT, 2 O'CLOCK 8 CM FROM NIPPLE; ULTRASOUND-GUIDED CORE BIOPSY: INVASIVE MAMMARY CARCINOMA WITH FEATURES OF MUCINOUS CARCINOMA. Size of invasive carcinoma: 4 mm in this sample. Histologic grade of invasive carcinoma: Grade 1. Ductal carcinoma in situ: Present, low-grade without necrosis. Lymphovascular invasion: Not identified. ER/PR/HER2: Immunohistochemistry will be performed on block A1, with reflex to Pratt for HER2 2+. The results will be reported in an addendum. Comment: The definitive tumor classification and grade will be assigned on the  excisional specimen. These findings were communicated to [facility] in Dr. Kateri Plummer office on 11/28/2017 at 1:28 PM. Read back procedure was performed. This was found to be concordant with Dr. Kateri Plummer impression and notes and confirmed by Dr. Jeanmarie Plant. Recommendation: Surgical and oncology referrals. At the patient's request, results and recommendations were relayed to the patient by phone by Dr. Jeanmarie Plant on 11/28/17. The patient stated she did well following the biopsy. Post biopsy instructions were reviewed with the patient and all of her questions were answered. She will be contacted by the nurse navigators for [facility] to arrange referrals. She was encouraged to contact the [facility] with any further questions or concerns. A request for referrals was relayed to the nurse navigators for [facility] by [place], [place] on 11/28/17. Addendum by Jetta Lout, RRA on 12/01/17. Electronically Signed   By: Fidela Salisbury M.D.   On: 12/01/2017 14:51   Result Date: 12/01/2017 CLINICAL DATA:  Left breast 2 o'clock 4 mm nodule. EXAM: ULTRASOUND GUIDED LEFT BREAST CORE NEEDLE BIOPSY COMPARISON:  Previous exam(s). FINDINGS: I met with the patient and we discussed the procedure of ultrasound-guided biopsy, including benefits and alternatives. We discussed the high likelihood of a successful procedure. We discussed the risks of the procedure, including infection, bleeding, tissue injury, clip migration, and inadequate sampling. Informed written consent was given. The usual time-out protocol was performed immediately prior to the procedure. Lesion quadrant: Upper outer quadrant Using sterile technique and 1% Lidocaine as local anesthetic, under direct ultrasound visualization, a 14 gauge spring-loaded device was used to perform biopsy of left breast 2 o'clock nodule using a lateral approach. At the conclusion of the procedure a ribbon shaped tissue marker clip was  deployed into the biopsy cavity. Follow up 2 view mammogram was performed and dictated separately. IMPRESSION: Ultrasound guided biopsy of left breast.  No apparent complications. Electronically Signed: By: Fidela Salisbury M.D. On: 11/27/2017 12:20    Assessment and plan- Patient is a 72 y.o. female with newly diagnosed invasive mammary carcinoma of the right breast stage Ia cT1 acN0 cM0 ER greater than 90% positive PR greater than 90% positive and HER-2/neu negative  Patient is already seen Dr. [name] and will be undergoing lumpectomy soon.  I will tentatively see her1 week after her surgery to discuss adjuvant treatment options.  Given the small size of the tumor on mammogram as well as grade 1 histology I do not think that patient will need adjuvant chemotherapy.  Patient will also see Dr. Donella Stade the same day she sees me to discuss adjuvant radiation treatment  Given that patient's tumor was ER PR  positive there would be a role for hormone therapy for 5 years which I will discuss with her in greater detail at next visit.  Treatment will be given with a curative intent.  Patient did have a bone density scan in June 2019 which showed a score of -1.6 at the left hip.  2.1% chance of 10-year risk of hip fracture and 11% 10-year risk of a major fracture.  I will factor this into decision making for hormone therapy at next visit.  Patient is unsure about her mother's history and thinks it may have been ovarian cancer.  I will discuss genetic counseling at next visit  Cancer Staging Invasive carcinoma of breast [facility] [address] [address]) Staging form: Breast, AJCC 8th Edition - Clinical stage from 12/05/2017: Stage IA (cT1a, cN0, cM0, G1, ER+, PR+, HER2-) - Signed by Sindy Guadeloupe, MD on 12/08/2017   Thank you for this kind referral and the opportunity to participate in the care of this patient   Visit Diagnosis 1. Invasive carcinoma of breast ([place])   2. Goals of care, counseling/discussion     Dr. Randa Evens,  MD, MPH [facility] at [facility] [phone] 12/05/2017  12:01 PM

## 2017-12-08 ENCOUNTER — Encounter
Admission: RE | Admit: 2017-12-08 | Discharge: 2017-12-08 | Disposition: A | Discharge status: Home / Self Care | Payer: PPO | Source: Ambulatory Visit | Attending: Surgery | Admitting: Surgery

## 2017-12-08 ENCOUNTER — Other Ambulatory Visit: Payer: Self-pay

## 2017-12-08 ENCOUNTER — Encounter: Payer: Self-pay | Admitting: Oncology

## 2017-12-08 ENCOUNTER — Telehealth: Payer: Self-pay | Admitting: Surgery

## 2017-12-08 DIAGNOSIS — Z87891 Personal history of nicotine dependence: Secondary | ICD-10-CM | POA: Diagnosis not present

## 2017-12-08 DIAGNOSIS — Z7982 Long term (current) use of aspirin: Secondary | ICD-10-CM | POA: Diagnosis not present

## 2017-12-08 DIAGNOSIS — C50919 Malignant neoplasm of unspecified site of unspecified female breast: Secondary | ICD-10-CM | POA: Insufficient documentation

## 2017-12-08 DIAGNOSIS — Z880 Allergy status to penicillin: Secondary | ICD-10-CM | POA: Diagnosis not present

## 2017-12-08 DIAGNOSIS — C50412 Malignant neoplasm of upper-outer quadrant of left female breast: Secondary | ICD-10-CM | POA: Diagnosis not present

## 2017-12-08 DIAGNOSIS — E785 Hyperlipidemia, unspecified: Secondary | ICD-10-CM | POA: Diagnosis not present

## 2017-12-08 DIAGNOSIS — Z170 Estrogen receptor positive status [ER+]: Secondary | ICD-10-CM | POA: Diagnosis not present

## 2017-12-08 DIAGNOSIS — E559 Vitamin D deficiency, unspecified: Secondary | ICD-10-CM | POA: Diagnosis not present

## 2017-12-08 DIAGNOSIS — Z86010 Personal history of colonic polyps: Secondary | ICD-10-CM | POA: Diagnosis not present

## 2017-12-08 DIAGNOSIS — Z79899 Other long term (current) drug therapy: Secondary | ICD-10-CM | POA: Diagnosis not present

## 2017-12-08 DIAGNOSIS — Z885 Allergy status to narcotic agent status: Secondary | ICD-10-CM | POA: Diagnosis not present

## 2017-12-08 DIAGNOSIS — Z8041 Family history of malignant neoplasm of ovary: Secondary | ICD-10-CM | POA: Diagnosis not present

## 2017-12-08 DIAGNOSIS — I10 Essential (primary) hypertension: Secondary | ICD-10-CM | POA: Diagnosis not present

## 2017-12-08 DIAGNOSIS — Z8249 Family history of ischemic heart disease and other diseases of the circulatory system: Secondary | ICD-10-CM | POA: Diagnosis not present

## 2017-12-08 HISTORY — DX: Hypothyroidism, unspecified: E03.9

## 2017-12-08 HISTORY — DX: Essential (primary) hypertension: I10

## 2017-12-08 HISTORY — DX: Nausea with vomiting, unspecified: R11.2

## 2017-12-08 HISTORY — DX: Other specified postprocedural states: Z98.890

## 2017-12-08 HISTORY — DX: Adverse effect of unspecified anesthetic, initial encounter: T41.45XA

## 2017-12-08 HISTORY — DX: Other complications of anesthesia, initial encounter: T88.59XA

## 2017-12-08 LAB — CBC
HEMATOCRIT: 35.9 % (ref 35.0–47.0)
Hemoglobin: 12.5 g/dL (ref 12.0–16.0)
MCH: 32.6 pg (ref 26.0–34.0)
MCHC: 34.9 g/dL (ref 32.0–36.0)
MCV: 93.6 fL (ref 80.0–100.0)
PLATELETS: 348 10*3/uL (ref 150–440)
RBC: 3.83 MIL/uL (ref 3.80–5.20)
RDW: 13.2 % (ref 11.5–14.5)
WBC: 8.4 10*3/uL (ref 3.6–11.0)

## 2017-12-08 LAB — COMPREHENSIVE METABOLIC PANEL
ALT: 29 U/L (ref 0–44)
AST: 27 U/L (ref 15–41)
Albumin: 4.3 g/dL (ref 3.5–5.0)
Alkaline Phosphatase: 88 U/L (ref 38–126)
Anion gap: 9 (ref 5–15)
BILIRUBIN TOTAL: 0.5 mg/dL (ref 0.3–1.2)
BUN: 8 mg/dL (ref 8–23)
CO2: 27 mmol/L (ref 22–32)
Calcium: 9.4 mg/dL (ref 8.9–10.3)
Chloride: 96 mmol/L — ABNORMAL LOW (ref 98–111)
Creatinine, Ser: 0.55 mg/dL (ref 0.44–1.00)
GFR calc Af Amer: >60 mL/min (ref >60)
GFR calc non Af Amer: >60 mL/min (ref >60)
Glucose, Bld: 103 mg/dL — ABNORMAL HIGH (ref 70–99)
POTASSIUM: 2.5 mmol/L — AB (ref 3.5–5.1)
Sodium: 132 mmol/L — ABNORMAL LOW (ref 135–145)
TOTAL PROTEIN: 7.2 g/dL (ref 6.5–8.1)

## 2017-12-08 MED ORDER — POTASSIUM CHLORIDE ER 10 MEQ PO TBCR: 40.0000 meq | EXTENDED_RELEASE_TABLET | ORAL | 0 refills | Status: DC

## 2017-12-08 MED ORDER — VANCOMYCIN HCL IN DEXTROSE 1-5 GM/200ML-% IV SOLN
1000.0000 mg | INTRAVENOUS | Status: AC
  Administered 2017-12-09: 1000 mg via INTRAVENOUS

## 2017-12-08 NOTE — Addendum Note (Signed)
Addended by: Riki Sheer on: 12/08/2017 06:23 PM   Modules accepted: Orders

## 2017-12-08 NOTE — Telephone Encounter (Addendum)
12/08/17  Received call from [facility] lab regarding pre-op labwork that had been done today in preparation for surgery tomorrow 9/17 with Dr. [name].  Had a critical low K of 2.5.  Discussed with Dr. [name] and tried calling patient to inform her of the lab result and to give her a prescription for K supplement.  There was no answer, but was able to leave voicemail to call me back.  Will try calling back later as well.  Olean Ree, MD  After calling back a second time, there was no answer.  Left another voicemail and have also sent a prescription for K-Dur 40 mEq x 2 doses for her low potassium to her pharmacy listed in the chart.  Olean Ree, MD

## 2017-12-08 NOTE — Patient Instructions (Signed)
Your procedure is scheduled on: 12/09/17 Report to Mammography. [place] AT 0820   Remember: Instructions that are not followed completely may result in serious medical risk,  up to and including death, or upon the discretion of your surgeon and anesthesiologist your  surgery may need to be rescheduled.     _X__ 1. Do not eat food after midnight the night before your procedure.                 No gum chewing or hard candies. You may drink clear liquids up to 2 hours                 before you are scheduled to arrive for your surgery- DO not drink clear                 liquids within 2 hours of the start of your surgery.                 Clear Liquids include:  water, apple juice without pulp, clear carbohydrate                 drink such as Clearfast of Gatorade, Black Coffee or Tea (Do not add                 anything to coffee or tea).  __X__2.  On the morning of surgery brush your teeth with toothpaste and water, you                may rinse your mouth with mouthwash if you wish.  Do not swallow any toothpaste of mouthwash.     _X__ 3.  No Alcohol for 24 hours before or after surgery.   _X__ 4.  Do Not Smoke or use e-cigarettes For 24 Hours Prior to Your Surgery.                 Do not use any chewable tobacco products for at least 6 hours prior to                 surgery.  ____  5.  Bring all medications with you on the day of surgery if instructed.   _X___  6.  Notify your doctor if there is any change in your medical condition      (cold, fever, infections).     Do not wear jewelry, make-up, hairpins, clips or nail polish. Do not wear lotions, powders, or perfumes. You may wear deodorant. Do not shave 48 hours prior to surgery. Men may shave face and neck. Do not bring valuables to the hospital.    [facility] is not responsible for any belongings or valuables.  Contacts, dentures or bridgework may not be worn into surgery. Leave your suitcase in the  car. After surgery it may be brought to your room. For patients admitted to the hospital, discharge time is determined by your treatment team.   Patients discharged the day of surgery will not be allowed to drive home.   Please read over the following fact sheets that you were given:   Surgical Site Infection Prevention    __X__ Take these medicines the morning of surgery with A SIP OF WATER:    1.CITALOPRAM  2.LEVOTHYROXINE  3.   4.  5.  6.  ____ Fleet Enema (as directed)   __X__ Use CHG Soap as directed  ____ Use inhalers on the day of surgery  ____ Stop metformin 2 days prior to surgery  ____ Take 1/2 of usual insulin dose the night before surgery. No insulin the morning          of surgery.   _X___ Stop Coumadin/Plavix/aspirin on     ALREADY STOPPED ____ Stop Anti-inflammatories on    ____ Stop supplements until after surgery.    ____ Bring C-Pap to the hospital.

## 2017-12-08 NOTE — Pre-Procedure Instructions (Signed)
EKG OK BY DR Ola Spurr TO PROCEED WITH SURGERY 12/09/17. PATIENT KNOWS DR PARASCHOS AND IS TEXTING HIM. SPOKE WITH BETSY AND EXPLAINED EKG FAXED FOR DR PARASCHOS TO REVIEW.

## 2017-12-09 ENCOUNTER — Encounter: Payer: Self-pay | Admitting: *Deleted

## 2017-12-09 ENCOUNTER — Encounter
Admission: RE | Admit: 2017-12-09 | Discharge: 2017-12-09 | Disposition: A | Discharge status: Home / Self Care | Payer: PPO | Source: Ambulatory Visit | Attending: Surgery | Admitting: Surgery

## 2017-12-09 ENCOUNTER — Ambulatory Visit
Admission: RE | Admit: 2017-12-09 | Discharge: 2017-12-09 | Disposition: A | Discharge status: Home / Self Care | Payer: PPO | Source: Ambulatory Visit | Attending: Surgery | Admitting: Surgery

## 2017-12-09 ENCOUNTER — Other Ambulatory Visit: Payer: Self-pay

## 2017-12-09 ENCOUNTER — Encounter
Admission: RE | Disposition: A | Discharge status: Home / Self Care | Payer: Self-pay | Source: Ambulatory Visit | Attending: Surgery

## 2017-12-09 ENCOUNTER — Ambulatory Visit: Payer: PPO | Admitting: Anesthesiology

## 2017-12-09 DIAGNOSIS — I10 Essential (primary) hypertension: Secondary | ICD-10-CM | POA: Insufficient documentation

## 2017-12-09 DIAGNOSIS — E785 Hyperlipidemia, unspecified: Secondary | ICD-10-CM | POA: Insufficient documentation

## 2017-12-09 DIAGNOSIS — Z8041 Family history of malignant neoplasm of ovary: Secondary | ICD-10-CM | POA: Insufficient documentation

## 2017-12-09 DIAGNOSIS — C50412 Malignant neoplasm of upper-outer quadrant of left female breast: Secondary | ICD-10-CM | POA: Insufficient documentation

## 2017-12-09 DIAGNOSIS — Z7982 Long term (current) use of aspirin: Secondary | ICD-10-CM | POA: Insufficient documentation

## 2017-12-09 DIAGNOSIS — Z79899 Other long term (current) drug therapy: Secondary | ICD-10-CM | POA: Insufficient documentation

## 2017-12-09 DIAGNOSIS — Z880 Allergy status to penicillin: Secondary | ICD-10-CM | POA: Insufficient documentation

## 2017-12-09 DIAGNOSIS — Z87891 Personal history of nicotine dependence: Secondary | ICD-10-CM | POA: Insufficient documentation

## 2017-12-09 DIAGNOSIS — Z8249 Family history of ischemic heart disease and other diseases of the circulatory system: Secondary | ICD-10-CM | POA: Insufficient documentation

## 2017-12-09 DIAGNOSIS — C50919 Malignant neoplasm of unspecified site of unspecified female breast: Secondary | ICD-10-CM

## 2017-12-09 DIAGNOSIS — E559 Vitamin D deficiency, unspecified: Secondary | ICD-10-CM | POA: Insufficient documentation

## 2017-12-09 DIAGNOSIS — Z86010 Personal history of colonic polyps: Secondary | ICD-10-CM | POA: Insufficient documentation

## 2017-12-09 DIAGNOSIS — Z170 Estrogen receptor positive status [ER+]: Secondary | ICD-10-CM | POA: Insufficient documentation

## 2017-12-09 DIAGNOSIS — Z885 Allergy status to narcotic agent status: Secondary | ICD-10-CM | POA: Insufficient documentation

## 2017-12-09 DIAGNOSIS — C50912 Malignant neoplasm of unspecified site of left female breast: Secondary | ICD-10-CM | POA: Diagnosis not present

## 2017-12-09 HISTORY — PX: BREAST LUMPECTOMY: SHX2

## 2017-12-09 HISTORY — PX: PARTIAL MASTECTOMY WITH NEEDLE LOCALIZATION AND AXILLARY SENTINEL LYMPH NODE BX: SHX6009

## 2017-12-09 LAB — POCT I-STAT 4, (NA,K, GLUC, HGB,HCT)
Glucose, Bld: 103 mg/dL — ABNORMAL HIGH (ref 70–99)
HCT: 35 % — ABNORMAL LOW (ref 36.0–46.0)
Hemoglobin: 11.9 g/dL — ABNORMAL LOW (ref 12.0–15.0)
Potassium: 3.6 mmol/L (ref 3.5–5.1)
Sodium: 140 mmol/L (ref 135–145)

## 2017-12-09 SURGERY — PARTIAL MASTECTOMY WITH NEEDLE LOCALIZATION AND AXILLARY SENTINEL LYMPH NODE BX
Anesthesia: General | Site: Breast | Laterality: Left

## 2017-12-09 MED ORDER — FENTANYL CITRATE (PF) 100 MCG/2ML IJ SOLN: 25.0000 ug | INTRAMUSCULAR | Status: DC | PRN

## 2017-12-09 MED ORDER — GABAPENTIN 300 MG PO CAPS
300.0000 mg | ORAL_CAPSULE | ORAL | Status: AC
  Administered 2017-12-09: 300 mg via ORAL

## 2017-12-09 MED ORDER — MIDAZOLAM HCL 5 MG/5ML IJ SOLN
INTRAMUSCULAR | Status: DC | PRN
  Administered 2017-12-09 (×2): 1 mg via INTRAVENOUS

## 2017-12-09 MED ORDER — CHLORHEXIDINE GLUCONATE CLOTH 2 % EX PADS
6.0000 | MEDICATED_PAD | Freq: Once | CUTANEOUS | Status: AC
  Administered 2017-12-09: 6 via TOPICAL

## 2017-12-09 MED ORDER — MIDAZOLAM HCL 2 MG/2ML IJ SOLN
INTRAMUSCULAR | Status: AC
  Filled 2017-12-09: qty 2

## 2017-12-09 MED ORDER — LIDOCAINE HCL (CARDIAC) PF 100 MG/5ML IV SOSY
PREFILLED_SYRINGE | INTRAVENOUS | Status: DC | PRN
  Administered 2017-12-09: 40 mg via INTRAVENOUS
  Administered 2017-12-09: 60 mg via INTRAVENOUS

## 2017-12-09 MED ORDER — PHENYLEPHRINE HCL 10 MG/ML IJ SOLN
INTRAMUSCULAR | Status: DC | PRN
  Administered 2017-12-09 (×2): 100 ug via INTRAVENOUS

## 2017-12-09 MED ORDER — LIDOCAINE HCL 1 % IJ SOLN
INTRAMUSCULAR | Status: DC | PRN
  Administered 2017-12-09: 4 mL

## 2017-12-09 MED ORDER — LIDOCAINE HCL (PF) 1 % IJ SOLN
INTRAMUSCULAR | Status: AC
  Filled 2017-12-09: qty 30

## 2017-12-09 MED ORDER — PROPOFOL 10 MG/ML IV BOLUS
INTRAVENOUS | Status: DC | PRN
  Administered 2017-12-09: 170 mg via INTRAVENOUS

## 2017-12-09 MED ORDER — SCOPOLAMINE 1 MG/3DAYS TD PT72
1.0000 | MEDICATED_PATCH | Freq: Once | TRANSDERMAL | Status: DC
  Administered 2017-12-09: 1.5 mg via TRANSDERMAL

## 2017-12-09 MED ORDER — TECHNETIUM TC 99M SULFUR COLLOID FILTERED
0.7400 | Freq: Once | INTRAVENOUS | Status: AC | PRN
  Administered 2017-12-09: 0.74 via INTRADERMAL

## 2017-12-09 MED ORDER — ACETAMINOPHEN 325 MG PO TABS: 650.0000 mg | ORAL_TABLET | Freq: Three times a day (TID) | ORAL | 0 refills | Status: AC | PRN

## 2017-12-09 MED ORDER — ACETAMINOPHEN 500 MG PO TABS
ORAL_TABLET | ORAL | Status: AC
  Filled 2017-12-09: qty 2

## 2017-12-09 MED ORDER — EPHEDRINE SULFATE 50 MG/ML IJ SOLN
INTRAMUSCULAR | Status: DC | PRN
  Administered 2017-12-09: 5 mg via INTRAVENOUS
  Administered 2017-12-09: 10 mg via INTRAVENOUS

## 2017-12-09 MED ORDER — KETOROLAC TROMETHAMINE 30 MG/ML IJ SOLN
INTRAMUSCULAR | Status: DC | PRN
  Administered 2017-12-09: 30 mg via INTRAVENOUS

## 2017-12-09 MED ORDER — ACETAMINOPHEN 500 MG PO TABS
1000.0000 mg | ORAL_TABLET | ORAL | Status: AC
  Administered 2017-12-09: 1000 mg via ORAL

## 2017-12-09 MED ORDER — SCOPOLAMINE 1 MG/3DAYS TD PT72
MEDICATED_PATCH | TRANSDERMAL | Status: AC
  Filled 2017-12-09: qty 1

## 2017-12-09 MED ORDER — TRAMADOL HCL 50 MG PO TABS: 50.0000 mg | ORAL_TABLET | Freq: Four times a day (QID) | ORAL | 0 refills | Status: DC | PRN

## 2017-12-09 MED ORDER — ONDANSETRON HCL 4 MG/2ML IJ SOLN
INTRAMUSCULAR | Status: DC | PRN
  Administered 2017-12-09: 4 mg via INTRAVENOUS

## 2017-12-09 MED ORDER — GABAPENTIN 300 MG PO CAPS
ORAL_CAPSULE | ORAL | Status: AC
  Filled 2017-12-09: qty 1

## 2017-12-09 MED ORDER — FENTANYL CITRATE (PF) 100 MCG/2ML IJ SOLN
INTRAMUSCULAR | Status: DC | PRN
  Administered 2017-12-09 (×3): 25 ug via INTRAVENOUS

## 2017-12-09 MED ORDER — CELECOXIB 200 MG PO CAPS
ORAL_CAPSULE | ORAL | Status: AC
  Filled 2017-12-09: qty 1

## 2017-12-09 MED ORDER — ONDANSETRON HCL 4 MG/2ML IJ SOLN: 4.0000 mg | Freq: Once | INTRAMUSCULAR | Status: DC | PRN

## 2017-12-09 MED ORDER — BUPIVACAINE HCL (PF) 0.5 % IJ SOLN
INTRAMUSCULAR | Status: AC
  Filled 2017-12-09: qty 30

## 2017-12-09 MED ORDER — CELECOXIB 200 MG PO CAPS
200.0000 mg | ORAL_CAPSULE | ORAL | Status: AC
  Administered 2017-12-09: 200 mg via ORAL

## 2017-12-09 MED ORDER — FAMOTIDINE 20 MG PO TABS
ORAL_TABLET | ORAL | Status: AC
  Filled 2017-12-09: qty 1

## 2017-12-09 MED ORDER — DEXAMETHASONE SODIUM PHOSPHATE 10 MG/ML IJ SOLN
INTRAMUSCULAR | Status: DC | PRN
  Administered 2017-12-09: 6 mg via INTRAVENOUS

## 2017-12-09 MED ORDER — FENTANYL CITRATE (PF) 100 MCG/2ML IJ SOLN
INTRAMUSCULAR | Status: AC
  Filled 2017-12-09: qty 2

## 2017-12-09 MED ORDER — SEVOFLURANE IN SOLN
RESPIRATORY_TRACT | Status: AC
  Filled 2017-12-09: qty 250

## 2017-12-09 MED ORDER — IBUPROFEN 800 MG PO TABS: 800.0000 mg | ORAL_TABLET | Freq: Three times a day (TID) | ORAL | 0 refills | Status: AC | PRN

## 2017-12-09 MED ORDER — DOCUSATE SODIUM 100 MG PO CAPS: 100.0000 mg | ORAL_CAPSULE | Freq: Two times a day (BID) | ORAL | 0 refills | Status: DC | PRN

## 2017-12-09 MED ORDER — VANCOMYCIN HCL IN DEXTROSE 1-5 GM/200ML-% IV SOLN
INTRAVENOUS | Status: AC
  Filled 2017-12-09: qty 200

## 2017-12-09 MED ORDER — ONDANSETRON HCL 4 MG/2ML IJ SOLN
INTRAMUSCULAR | Status: AC
  Filled 2017-12-09: qty 2

## 2017-12-09 MED ORDER — FAMOTIDINE 20 MG PO TABS
20.0000 mg | ORAL_TABLET | Freq: Once | ORAL | Status: AC
  Administered 2017-12-09: 20 mg via ORAL

## 2017-12-09 MED ORDER — DEXAMETHASONE SODIUM PHOSPHATE 10 MG/ML IJ SOLN
INTRAMUSCULAR | Status: AC
  Filled 2017-12-09: qty 1

## 2017-12-09 MED ORDER — LACTATED RINGERS IV SOLN
INTRAVENOUS | Status: DC
  Administered 2017-12-09: 11:00:00 via INTRAVENOUS

## 2017-12-09 MED ORDER — PROPOFOL 10 MG/ML IV BOLUS
INTRAVENOUS | Status: AC
  Filled 2017-12-09: qty 20

## 2017-12-09 SURGICAL SUPPLY — 44 items
APPLIER CLIP 11 MED OPEN (CLIP)
BLADE SURG 15 STRL LF DISP TIS (BLADE) ×1 IMPLANT
BLADE SURG 15 STRL SS (BLADE) ×2
CANISTER SUCT 1200ML W/VALVE (MISCELLANEOUS) ×3 IMPLANT
CHLORAPREP W/TINT 26ML (MISCELLANEOUS) ×3 IMPLANT
CLIP APPLIE 11 MED OPEN (CLIP) IMPLANT
CNTNR SPEC 2.5X3XGRAD LEK (MISCELLANEOUS) ×1
CONT SPEC 4OZ STER OR WHT (MISCELLANEOUS) ×2
CONTAINER SPEC 2.5X3XGRAD LEK (MISCELLANEOUS) ×1 IMPLANT
COVER PROBE FLX POLY STRL (MISCELLANEOUS) ×3 IMPLANT
DERMABOND ADVANCED (GAUZE/BANDAGES/DRESSINGS) ×2
DERMABOND ADVANCED .7 DNX12 (GAUZE/BANDAGES/DRESSINGS) ×1 IMPLANT
DEVICE DUBIN SPECIMEN MAMMOGRA (MISCELLANEOUS) ×3 IMPLANT
DRAPE LAPAROTOMY TRNSV 106X77 (MISCELLANEOUS) ×3 IMPLANT
DRAPE SHEET LG 3/4 BI-LAMINATE (DRAPES) ×3 IMPLANT
ELECT CAUTERY BLADE TIP 2.5 (TIP) ×3
ELECT REM PT RETURN 9FT ADLT (ELECTROSURGICAL) ×3
ELECTRODE CAUTERY BLDE TIP 2.5 (TIP) ×1 IMPLANT
ELECTRODE REM PT RTRN 9FT ADLT (ELECTROSURGICAL) ×1 IMPLANT
GAUZE SPONGE 4X4 12PLY STRL (GAUZE/BANDAGES/DRESSINGS) ×3 IMPLANT
GLOVE BIOGEL PI IND STRL 7.0 (GLOVE) ×1 IMPLANT
GLOVE BIOGEL PI INDICATOR 7.0 (GLOVE) ×2
GLOVE SURG SYN 6.5 ES PF (GLOVE) ×6 IMPLANT
GOWN STRL REUS W/ TWL LRG LVL3 (GOWN DISPOSABLE) ×3 IMPLANT
GOWN STRL REUS W/TWL LRG LVL3 (GOWN DISPOSABLE) ×6
JACKSON PRATT 10 (INSTRUMENTS) IMPLANT
KIT TURNOVER KIT A (KITS) ×3 IMPLANT
LABEL OR SOLS (LABEL) ×3 IMPLANT
LIGHT WAVEGUIDE WIDE FLAT (MISCELLANEOUS) ×3 IMPLANT
NEEDLE HYPO 25X1 1.5 SAFETY (NEEDLE) ×6 IMPLANT
PACK BASIN MINOR ARMC (MISCELLANEOUS) ×3 IMPLANT
SUT MNCRL 4-0 (SUTURE) ×4
SUT MNCRL 4-0 27XMFL (SUTURE) ×2
SUT SILK 2 0 (SUTURE)
SUT SILK 2 0 SH (SUTURE) ×3 IMPLANT
SUT SILK 2-0 30XBRD TIE 12 (SUTURE) IMPLANT
SUT SILK 3 0 12 30 (SUTURE) IMPLANT
SUT VIC AB 3-0 SH 27 (SUTURE) ×4
SUT VIC AB 3-0 SH 27X BRD (SUTURE) ×2 IMPLANT
SUTURE MNCRL 4-0 27XMF (SUTURE) ×2 IMPLANT
SYR 10ML LL (SYRINGE) ×3 IMPLANT
SYR 50ML LL SCALE MARK (SYRINGE) IMPLANT
SYR BULB IRRIG 60ML STRL (SYRINGE) ×3 IMPLANT
WATER STERILE IRR 1000ML POUR (IV SOLUTION) ×3 IMPLANT

## 2017-12-09 NOTE — Anesthesia Post-op Follow-up Note (Signed)
Anesthesia QCDR form completed.        

## 2017-12-09 NOTE — Anesthesia Procedure Notes (Addendum)
Procedure Name: LMA Insertion Date/Time: 12/09/2017 12:00 PM Performed by: Dionne Bucy, CRNA Pre-anesthesia Checklist: Patient identified, Patient being monitored, Timeout performed, Emergency Drugs available and Suction available Patient Re-evaluated:Patient Re-evaluated prior to induction Oxygen Delivery Method: Circle system utilized Preoxygenation: Pre-oxygenation with 100% oxygen Induction Type: IV induction Ventilation: Mask ventilation without difficulty LMA: LMA inserted LMA Size: 4.0 Tube type: Oral Number of attempts: 1 Placement Confirmation: positive ETCO2 and breath sounds checked- equal and bilateral Tube secured with: Tape Dental Injury: Teeth and Oropharynx as per pre-operative assessment

## 2017-12-09 NOTE — Progress Notes (Signed)
Ice pack to left breast

## 2017-12-09 NOTE — Interval H&P Note (Signed)
History and Physical Interval Note:  12/09/2017 11:38 AM  Christy Braun  has presented today for surgery, with the diagnosis of MALIGNANT NEOPLASM LEFT BREAST  The various methods of treatment have been discussed with the patient and family. After consideration of risks, benefits and other options for treatment, the patient has consented to  Procedure(s): PARTIAL MASTECTOMY WITH NEEDLE LOCALIZATION AND AXILLARY SENTINEL LYMPH NODE BX (Left) as a surgical intervention .  The patient's history has been reviewed, patient examined, no change in status, stable for surgery.  I have reviewed the patient's chart and labs.  Questions were answered to the patient's satisfaction.     Christy Braun [name]

## 2017-12-09 NOTE — Op Note (Signed)
Preoperative diagnosis:  left breast carcinoma.  Postoperative diagnosis: same.   Procedure: needle-localized breast lumpectomy.                      left  Axillary Sentinel Lymph node biopsy  Anesthesia: GETA  Surgeon: Dr. Benjamine Sprague  Wound Classification: Clean  Indications: Patient is a 72 y.o. female with a nonpalpable left  breast mass noted on mammography with core biopsy demonstrating ductal CA.   requires needle-localized lumpectomy for treatment with sentinel lymph node biopsy.   Specimen: left  Breast mass, Sentinel Lymph nodes x 1  Complications: None  Estimated Blood Loss: 43mL  Findings: 1. Specimen mammography shows marker and wire on specimen 2. Pathology call refers gross examination of margins was <66mm on anterior margin, obtained additional anterior margin which had negative margins 3. No other palpable mass or lymph node identified.   Description of procedure: Preoperative needle localization was performed by radiology. In the nuclear medicine suite, the subareolar region was injected with Tc-99 sulfur colloid. Localization studies were reviewed. The patient was taken to the operating room and placed supine on the operating table, and after general anesthesia the left breast and axilla were prepped and draped in the usual sterile fashion. A time-out was completed verifying correct patient, procedure, site, positioning, and implant(s) and/or special equipment prior to beginning this procedure.  By comparing the localization studies with the direction and skin entry site of the needle, the probable trajectory and location of the mass was visualized. A horitzontal skin incision right above the wire insertion site was planned in such a way as to minimize the amount of dissection to reach the mass.  The skin incision was made after infusion of local. Flaps were raised and the location of the wire confirmed. The wire was delivered into the wound. Sharp and blunt dissection  was then taken down to the mass, measuring 3cm x 2cm x 3cm, taking care to include the entire localizing needle and a margin of grossly normal tissue. The specimen and entire localizing wire were removed. The specimen was oriented with long lateral, short superior, deep double sutures and sent to radiology with the localization studies. Confirmation was received that the clip was within specimen.  Path called with <48mm margin at anterior portion, so that margin within the breast was excised and marked with short superior, long lateral, and double old margin.  Confirmed the margin specimen was clear of the tumor. A hand-held gamma probe was used to identify the location of the hottest spot in the axilla from the same incision made for mass. Sharp and blunt dissection was carried down to subdermal facias. The probe was placed within wound and again, the point of maximal count was found. Dissection continue until nodule was identified. The probe was placed in contact with the node and 3980 sec counts were recorded. The node was excised in its entirety. Ex vivo, the node measured 38540/10sec counts when placed on the probe. The bed of the node on average measured <150 counts.  No additional hot spots were identified. No clinically abnormal nodes were palpated. Wound irrigated, hemostasis was achieved and the wound closed in layers with  interrupted sutures of 3-0 Vicryl in deep dermal layer and a running subcuticular suture of Monocryl 4-0, then dressed with dermabond. The patient tolerated the procedure well and was taken to the postanesthesia care unit in stable condition. Sponge and instrument count correct at end of procedure.

## 2017-12-09 NOTE — Discharge Instructions (Signed)
- tylenol and advil as needed for discomfort.  Please alternate between the two every four hours as needed for pain.   - Use narcotics, if prescribed, only when tylenol and motrin is not enough to control pain. - 500-650mg  every 8hrs to max of 4000mg /24hrs for the tylenol.   - Advil up to 800mg  per dose every 8hrs as needed for pain.    Breast Biopsy, Care After These instructions give you information about caring for yourself after your procedure. Your doctor may also give you more specific instructions. Call your doctor if you have any problems or questions after your procedure. Follow these instructions at home: Medicines  Take over-the-counter and prescription medicines only as told by your doctor.  Do not drive for 24 hours if you received a sedative.  Do not drink alcohol while taking pain medicine.  Do not drive or use heavy machinery while taking prescription pain medicine. Biopsy Site Care   Follow instructions from your doctor about how to take care of your cut from surgery (incision) or puncture area. Make sure you: ? Wash your hands with soap and water before you change your bandage. If you cannot use soap and water, use hand sanitizer. ? Change any bandages (dressings) as told by your doctor. ? Leave any stitches (sutures), skin glue, or skin tape (adhesive) strips in place. They may need to stay in place for 2 weeks or longer. If tape strips get loose and curl up, you may trim the loose edges. Do not remove tape strips completely unless your doctor says it is okay.  If you have stitches, keep them dry when you take a bath or a shower.  Check your cut or puncture area every day for signs of infection. Check for: ? More redness, swelling, or pain. ? More fluid or blood. ? Warmth. ? Pus or a bad smell.  Protect the biopsy area. Do not let the area get bumped. Activity  Avoid activities that could pull the biopsy site open. ? Avoid stretching. ? Avoid  reaching. ? Avoid exercise. ? Avoid sports. ? Avoid lifting anything that is heavier than 3 pounds (1.4 kg).  Return to your normal activities as told by your doctor. Ask your doctor what activities are safe for you. General instructions  Continue your normal diet.  Wear a good support bra for as long as told by your doctor.  Get checked for extra fluid in your body (lymphedema) as often as told by your doctor.  Keep all follow-up visits as told by your doctor. This is important. Contact a health care provider if:  You have more redness, swelling, or pain at the biopsy site.  You have more fluid or blood coming from your biopsy site.  Your biopsy site feels warm to the touch.  You have pus or a bad smell coming from the biopsy site.  Your biopsy site breaks open after the stitches, staples, or skin tape strips have been removed.  You have a rash.  You have a fever. Get help right away if:  You have more bleeding (more than a small spot) from the biopsy site.  You have trouble breathing.  You have red streaks around the biopsy site. This information is not intended to replace advice given to you by your health care provider. Make sure you discuss any questions you have with your health care provider. Document Released: 01/05/2009 Document Revised: 11/16/2015 Document Reviewed: 12/13/2014 Elsevier Interactive Patient Education  2018 [employer].

## 2017-12-09 NOTE — Anesthesia Postprocedure Evaluation (Signed)
Anesthesia Post Note  Patient: Christy Braun  Procedure(s) Performed: PARTIAL MASTECTOMY WITH NEEDLE LOCALIZATION AND AXILLARY SENTINEL LYMPH NODE BX (Left Breast)  Patient location during evaluation: PACU Anesthesia Type: General Level of consciousness: awake and alert Pain management: pain level controlled Vital Signs Assessment: post-procedure vital signs reviewed and stable Respiratory status: spontaneous breathing and respiratory function stable Cardiovascular status: stable Anesthetic complications: no     Last Vitals:  Vitals:   12/09/17 1341 12/09/17 1356  BP: 126/69 124/66  Pulse: 86 89  Resp: 11 15  Temp: (!) 35.9 C   SpO2: 100% 96%    Last Pain:  Vitals:   12/09/17 1341  TempSrc:   PainSc: 0-No pain                 [name] K

## 2017-12-09 NOTE — Anesthesia Preprocedure Evaluation (Signed)
Anesthesia Evaluation  Patient identified by MRN, date of birth, ID band Patient awake    Reviewed: Allergy & Precautions, NPO status , Patient's Chart, lab work & pertinent test results  History of Anesthesia Complications (+) PONV  Airway Mallampati: II       Dental   Pulmonary neg sleep apnea, neg COPD, former smoker,           Cardiovascular hypertension, Pt. on medications (-) Past MI and (-) CHF (-) dysrhythmias (-) Valvular Problems/Murmurs     Neuro/Psych neg Seizures Depression    GI/Hepatic Neg liver ROS, neg GERD  ,  Endo/Other  neg diabetesHypothyroidism   Renal/GU negative Renal ROS     Musculoskeletal   Abdominal   Peds  Hematology   Anesthesia Other Findings   Reproductive/Obstetrics                             Anesthesia Physical Anesthesia Plan  ASA: II  Anesthesia Plan: General   Post-op Pain Management:    Induction: Intravenous  PONV Risk Score and Plan: 4 or greater and Dexamethasone, Ondansetron and Treatment may vary due to age or medical condition  Airway Management Planned: LMA  Additional Equipment:   Intra-op Plan:   Post-operative Plan:   Informed Consent: I have reviewed the patients History and Physical, chart, labs and discussed the procedure including the risks, benefits and alternatives for the proposed anesthesia with the patient or authorized representative who has indicated his/her understanding and acceptance.     Plan Discussed with:   Anesthesia Plan Comments:         Anesthesia Quick Evaluation

## 2017-12-09 NOTE — Transfer of Care (Signed)
Immediate Anesthesia Transfer of Care Note  Patient: Christy Braun  Procedure(s) Performed: PARTIAL MASTECTOMY WITH NEEDLE LOCALIZATION AND AXILLARY SENTINEL LYMPH NODE BX (Left Breast)  Patient Location: PACU  Anesthesia Type:General  Level of Consciousness: sedated  Airway & Oxygen Therapy: Patient Spontanous Breathing and Patient connected to face mask oxygen  Post-op Assessment: Report given to RN and Post -op Vital signs reviewed and stable  Post vital signs: Reviewed and stable  Last Vitals:  Vitals Value Taken Time  BP 126/69   Temp 96.26F   Pulse 87   Resp 11   SpO2 100%     Last Pain:  Vitals:   12/09/17 1019  TempSrc: Temporal  PainSc: 0-No pain         Complications: No apparent anesthesia complications

## 2017-12-10 ENCOUNTER — Encounter: Payer: Self-pay | Admitting: Surgery

## 2017-12-11 ENCOUNTER — Other Ambulatory Visit: Payer: Self-pay

## 2017-12-11 DIAGNOSIS — C50919 Malignant neoplasm of unspecified site of unspecified female breast: Secondary | ICD-10-CM

## 2017-12-11 LAB — SURGICAL PATHOLOGY

## 2017-12-11 NOTE — Progress Notes (Signed)
  Oncology Nurse Navigator Documentation  Navigator Location: [facility] (12/11/17 1600)   )Navigator Encounter Type: Telephone (12/11/17 1600) Telephone: Lahoma Crocker Call;Appt Confirmation/Clarification (12/11/17 1600)                   Patient Visit Type: Follow-up (12/11/17 1600)                              Time Spent with Patient: 15 (12/11/17 1600)   Patient reports she is doing well post-op.  Confirmed follow-up Medical Oncology appointment, and Radiation Oncology consult for 12/18/17.

## 2017-12-12 ENCOUNTER — Other Ambulatory Visit: Payer: Self-pay | Admitting: Pharmacist

## 2017-12-12 NOTE — Patient Outreach (Addendum)
[facility]) Care Management  12/12/2017  Christy Braun 06-07-1945 [id number]   Incoming call from [facility] in response to the [facility] Medication Adherence Campaign. Speak with patient. HIPAA identifiers verified and verbal consent received.  [name] reports that she takes her losartan-hydrochlorothiazide 50-12.5 mg once daily as directed. Reports that she misses a dose at most once a month. Counsel patient on the importance of medication adherence. Patient verbalizes understanding and states that she uses a pillbox to organize her medicine. Reports that she last had this prescription refilled this month.  Denies any medication questions/concerns at this time.   Will close pharmacy episode.  Harlow Asa, PharmD, [place] [facility] [phone]

## 2017-12-15 ENCOUNTER — Encounter: Payer: Self-pay | Admitting: Surgery

## 2017-12-18 ENCOUNTER — Inpatient Hospital Stay (HOSPITAL_BASED_OUTPATIENT_CLINIC_OR_DEPARTMENT_OTHER): Payer: PPO | Admitting: Oncology

## 2017-12-18 ENCOUNTER — Encounter: Payer: Self-pay | Admitting: Oncology

## 2017-12-18 ENCOUNTER — Ambulatory Visit
Admission: RE | Admit: 2017-12-18 | Discharge: 2017-12-18 | Disposition: A | Discharge status: Home / Self Care | Payer: PPO | Source: Ambulatory Visit | Attending: Radiation Oncology | Admitting: Radiation Oncology

## 2017-12-18 VITALS — BP 99/65 | HR 102 | Temp 98.2°F | Resp 18 | Ht 65.0 in | Wt 140.0 lb

## 2017-12-18 DIAGNOSIS — Z79899 Other long term (current) drug therapy: Secondary | ICD-10-CM | POA: Diagnosis not present

## 2017-12-18 DIAGNOSIS — C50412 Malignant neoplasm of upper-outer quadrant of left female breast: Secondary | ICD-10-CM | POA: Insufficient documentation

## 2017-12-18 DIAGNOSIS — Z87891 Personal history of nicotine dependence: Secondary | ICD-10-CM | POA: Diagnosis not present

## 2017-12-18 DIAGNOSIS — Z800 Family history of malignant neoplasm of digestive organs: Secondary | ICD-10-CM | POA: Diagnosis not present

## 2017-12-18 DIAGNOSIS — C50911 Malignant neoplasm of unspecified site of right female breast: Secondary | ICD-10-CM | POA: Diagnosis not present

## 2017-12-18 DIAGNOSIS — Z8041 Family history of malignant neoplasm of ovary: Secondary | ICD-10-CM

## 2017-12-18 DIAGNOSIS — C50919 Malignant neoplasm of unspecified site of unspecified female breast: Secondary | ICD-10-CM

## 2017-12-18 DIAGNOSIS — Z170 Estrogen receptor positive status [ER+]: Secondary | ICD-10-CM | POA: Diagnosis not present

## 2017-12-18 DIAGNOSIS — Z7189 Other specified counseling: Secondary | ICD-10-CM

## 2017-12-18 DIAGNOSIS — I10 Essential (primary) hypertension: Secondary | ICD-10-CM | POA: Insufficient documentation

## 2017-12-18 DIAGNOSIS — E785 Hyperlipidemia, unspecified: Secondary | ICD-10-CM | POA: Insufficient documentation

## 2017-12-18 DIAGNOSIS — Z803 Family history of malignant neoplasm of breast: Secondary | ICD-10-CM | POA: Diagnosis not present

## 2017-12-18 DIAGNOSIS — E039 Hypothyroidism, unspecified: Secondary | ICD-10-CM | POA: Diagnosis not present

## 2017-12-18 NOTE — Progress Notes (Signed)
Pt here to determine treatment plan after surgery on her breast. It is sore to touch but pt states no pain. She is very fatigued ever since she had her surgery. Her appt for f/u with surgeon is next week

## 2017-12-18 NOTE — Consult Note (Signed)
NEW PATIENT EVALUATION  Name: Christy Braun  MRN: [id number]  Date:   12/18/2017     DOB: Nov 24, 1945   This 72 y.o. female patient presents to the clinic for initial evaluation of stage I (T1 BN 0 M0) invasive mammary carcinoma left breast ER/PR positive status post wide local excision and sentinel node biopsy.  REFERRING PHYSICIAN: Adin Hector, MD  CHIEF COMPLAINT:  Chief Complaint  Patient presents with  . Breast Cancer    Pt here for inital consultation of Breast cancer    DIAGNOSIS: The encounter diagnosis was Invasive carcinoma of breast ([place]).   PREVIOUS INVESTIGATIONS:  Mammogram and ultrasound reviewed Pathology reports reviewed Clinical notes reviewed  HPI: patient is a 72 year old female who presented with an abnormal mammogram of her left breast showing a 4 mm mass in the left upper outer quadrant. No evidence of axillary lymphadenopathy was noted. This was confirmed on ultrasound and she underwent ultrasound-guided biopsy positive for invasive mammary carcinoma.she then underwent a wide local excision and sentinel node biopsy. Tumor was 7 mm mucinous type overall grade 1.with the reexcision margins were clear at 1.5 mm.tumor was ER/PR positive HER-2/neu not overexpressed.2 sentinel lymph nodes were negative for metastatic disease. Patient is quite fatigued although doing well postoperatively. She seen today for radiation oncology opinion. Based on the small size of her lesion well differentiated nature she's been declined for chemotherapy she will receive antiestrogen therapy after completion of radiation  PLANNED TREATMENT REGIMEN: left whole breast radiation  PAST MEDICAL HISTORY:  has a past medical history of Bone cancer ([place]), Complication of anesthesia, Hyperlipidemia, Hypertension, Hypothyroidism, and PONV (postoperative nausea and vomiting).    PAST SURGICAL HISTORY:  Past Surgical History:  Procedure Laterality Date  . BACK SURGERY     lumbar ruptured  disc  . BREAST BIOPSY Left 9/5/20019   u/s core bc path pending  . BREAST LUMPECTOMY Left 12/09/2017   NL with SN  . FOOT SURGERY Bilateral    x 3 bunionectomy  . PARTIAL MASTECTOMY WITH NEEDLE LOCALIZATION AND AXILLARY SENTINEL LYMPH NODE BX Left 12/09/2017   Procedure: PARTIAL MASTECTOMY WITH NEEDLE LOCALIZATION AND AXILLARY SENTINEL LYMPH NODE BX;  Surgeon: Benjamine Sprague, DO;  Location: [facility] ORS;  Service: General;  Laterality: Left;    FAMILY HISTORY: family history includes Diabetes in her father; Heart disease in her father; Ovarian cancer in her mother.  SOCIAL HISTORY:  reports that she quit smoking about 49 years ago. Her smoking use included cigarettes. She has never used smokeless tobacco. She reports that she drinks alcohol. She reports that she does not use drugs.  ALLERGIES: Penicillins and Oxycodone-acetaminophen  MEDICATIONS:  Current Outpatient Medications  Medication Sig Dispense Refill  . acetaminophen (TYLENOL) 325 MG tablet Take 2 tablets (650 mg total) by mouth every 8 (eight) hours as needed for mild pain. 40 tablet 0  . atorvastatin (LIPITOR) 80 MG tablet Take 80 mg by mouth daily.    . Cholecalciferol (VITAMIN D3) 1000 units CAPS Take 1,000 Units by mouth 2 (two) times daily.    . citalopram (CELEXA) 20 MG tablet Take 20 mg by mouth daily.    [name] ibuprofen (ADVIL,MOTRIN) 800 MG tablet Take 1 tablet (800 mg total) by mouth every 8 (eight) hours as needed for mild pain or moderate pain. 30 tablet 0  . latanoprost (XALATAN) 0.005 % ophthalmic solution Place 1 drop into both eyes at bedtime.    [name] levothyroxine (SYNTHROID, LEVOTHROID) 50 MCG tablet Take 50  mcg by mouth daily before breakfast.    . losartan-hydrochlorothiazide (HYZAAR) 50-12.5 MG tablet Take 1 tablet by mouth daily.    . Multiple Vitamin (MULTIVITAMIN WITH MINERALS) TABS tablet Take 1 tablet by mouth daily.     No current facility-administered medications for this encounter.     ECOG PERFORMANCE  STATUS:  0 - Asymptomatic  REVIEW OF SYSTEMS:  Patient denies any weight loss, fatigue, weakness, fever, chills or night sweats. Patient denies any loss of vision, blurred vision. Patient denies any ringing  of the ears or hearing loss. No irregular heartbeat. Patient denies heart murmur or history of fainting. Patient denies any chest pain or pain radiating to her upper extremities. Patient denies any shortness of breath, difficulty breathing at night, cough or hemoptysis. Patient denies any swelling in the lower legs. Patient denies any nausea vomiting, vomiting of blood, or coffee ground material in the vomitus. Patient denies any stomach pain. Patient states has had normal bowel movements no significant constipation or diarrhea. Patient denies any dysuria, hematuria or significant nocturia. Patient denies any problems walking, swelling in the joints or loss of balance. Patient denies any skin changes, loss of hair or loss of weight. Patient denies any excessive worrying or anxiety or significant depression. Patient denies any problems with insomnia. Patient denies excessive thirst, polyuria, polydipsia. Patient denies any swollen glands, patient denies easy bruising or easy bleeding. Patient denies any recent infections, allergies or URI. Patient "s visual fields have not changed significantly in recent time.    PHYSICAL EXAM: There were no vitals taken for this visit. Left breast is wide local excision in the upper outer quadrant which is healing well. No dominant mass or nodularity is noted in either breast in 2 positions examined. No axillary or supraclavicular adenopathy is identified.Well-developed well-nourished patient in NAD. HEENT reveals PERLA, EOMI, discs not visualized.  Oral cavity is clear. No oral mucosal lesions are identified. Neck is clear without evidence of cervical or supraclavicular adenopathy. Lungs are clear to A&P. Cardiac examination is essentially unremarkable with regular rate  and rhythm without murmur rub or thrill. Abdomen is benign with no organomegaly or masses noted. Motor sensory and DTR levels are equal and symmetric in the upper and lower extremities. Cranial nerves II through XII are grossly intact. Proprioception is intact. No peripheral adenopathy or edema is identified. No motor or sensory levels are noted. Crude visual fields are within normal range.  LABORATORY DATA: pathology reports reviewed    RADIOLOGY RESULTS:mammogram and ultrasound reviewed   IMPRESSION: tage I invasive mammary carcinoma mucinous type of the left breast status post wide local excision and sentinel node biopsy ER/PR positive HER-2/neu negative in 72 year old female  PLAN: this time like to go ahead with whole breast radiation. I believe her breast is too large for hyperfractionated course of treatment. Would plan on delivering 5040 cGy in 28 fractions based on the close 1.5 mm margin would also boost her scar another 1600 cGy using electron beam. Risks and benefits of treatment including skin reaction fatigue alteration of blood counts possible inclusion of superficial lung all were discussed in detail with the patient. She seems to comprehend my treatment plan well. I personally set up and ordered CT simulation for next week to allow for some more healing.patient will be receiving antiestrogen therapy after completion of radiation.  I would like to take this opportunity to thank you for allowing me to participate in the care of your patient.Noreene Filbert, MD

## 2017-12-19 NOTE — Progress Notes (Signed)
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  Diagnostic mammogram and ultrasound of the left breast showed 3 x 4 x 3 mm hypoechoic mass at the 2 o'clock position.  No pathologic left axillary adenopathy.  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  Patient has seen Dr. [name] and will be undergoing lumpectomy soon.  No prior history of breast cancer or breast biopsies.  Menarche at the age of 40.  Menopause was close to 10 years ago.  She is G2, P2.  She did not breast-feed.  No family history of breast, colon, pancreatic cancer.  States that her mother had some cancer and she is not sure if it was ovarian cancer   Interval history-she reports feeling sore at the site of lumpectomy.  Reports fatigue denies other complaints  ECOG PS- 1 Pain scale- 0 Opioid associated constipation- no  Review of systems- Review of Systems  Constitutional: Positive for malaise/fatigue. Negative for chills, fever and weight loss.  HENT: Negative for congestion, ear discharge and nosebleeds.   Eyes: Negative for blurred vision.  Respiratory: Negative for cough, hemoptysis, sputum production, shortness of breath and wheezing.   Cardiovascular: Negative for chest pain, palpitations,  orthopnea and claudication.  Gastrointestinal: Negative for abdominal pain, blood in stool, constipation, diarrhea, heartburn, melena, nausea and vomiting.  Genitourinary: Negative for dysuria, flank pain, frequency, hematuria and urgency.  Musculoskeletal: Negative for back pain, joint pain and myalgias.  Skin: Negative for rash.  Neurological: Negative for dizziness, tingling, focal weakness, seizures, weakness and headaches.  Endo/Heme/Allergies: Does not bruise/bleed easily.  Psychiatric/Behavioral: Negative for depression and suicidal ideas. The patient does not have insomnia.        Allergies  Allergen Reactions  . Penicillins Hives, Swelling and Other (See Comments)    "knots" on legs Has patient had a PCN reaction causing immediate rash, facial/tongue/throat swelling, SOB or lightheadedness with hypotension: No Has patient had a PCN reaction causing severe rash involving mucus membranes or skin necrosis: No Has patient had a PCN reaction that required hospitalization: No Has patient had a PCN reaction occurring within the last 10 years: No If all of the above answers are "NO", then may proceed with Cephalosporin use.    [name] Oxycodone-Acetaminophen Anxiety and Other (See Comments)    confusion     Past Medical History:  Diagnosis Date  . Bone cancer ([place])   . Complication of anesthesia   . Hyperlipidemia   . Hypertension   . Hypothyroidism   . PONV (postoperative nausea and vomiting)      Past Surgical History:  Procedure Laterality Date  . BACK SURGERY     lumbar ruptured disc  . BREAST BIOPSY Left 9/5/20019   u/s core bc path pending  . BREAST LUMPECTOMY Left 12/09/2017   NL with SN  . FOOT SURGERY  Bilateral    x 3 bunionectomy  . PARTIAL MASTECTOMY WITH NEEDLE LOCALIZATION AND AXILLARY SENTINEL LYMPH NODE BX Left 12/09/2017   Procedure: PARTIAL MASTECTOMY WITH NEEDLE LOCALIZATION AND AXILLARY SENTINEL LYMPH NODE BX;  Surgeon: Benjamine Sprague, DO;  Location: [facility]  ORS;  Service: General;  Laterality: Left;    Social History   Socioeconomic History  . Marital status: Married    Spouse name: Not on file  . Number of children: Not on file  . Years of education: Not on file  . Highest education level: Not on file  Occupational History  . Not on file  Social Needs  . Financial resource strain: Not on file  . Food insecurity:    Worry: Not on file    Inability: Not on file  . Transportation needs:    Medical: Not on file    Non-medical: Not on file  Tobacco Use  . Smoking status: Former Smoker    Types: Cigarettes    Last attempt to quit: 12/08/1968    Years since quitting: 49.0  . Smokeless tobacco: Never Used  . Tobacco comment: quit smoking over 50 yrs ago  Substance and Sexual Activity  . Alcohol use: Yes    Comment: rare  . Drug use: Never  . Sexual activity: Not on file  Lifestyle  . Physical activity:    Days per week: Not on file    Minutes per session: Not on file  . Stress: Not on file  Relationships  . Social connections:    Talks on phone: Not on file    Gets together: Not on file    Attends religious service: Not on file    Active member of club or organization: Not on file    Attends meetings of clubs or organizations: Not on file    Relationship status: Not on file  . Intimate partner violence:    Fear of current or ex partner: Not on file    Emotionally abused: Not on file    Physically abused: Not on file    Forced sexual activity: Not on file  Other Topics Concern  . Not on file  Social History Narrative  . Not on file    Family History  Problem Relation Age of Onset  . Ovarian cancer Mother   . Heart disease Father   . Diabetes Father   . Breast cancer Neg Hx      Current Outpatient Medications:  .  acetaminophen (TYLENOL) 325 MG tablet, Take 2 tablets (650 mg total) by mouth every 8 (eight) hours as needed for mild pain., Disp: 40 tablet, Rfl: 0 .  atorvastatin (LIPITOR) 80 MG tablet, Take 80 mg  by mouth daily., Disp: , Rfl:  .  Cholecalciferol (VITAMIN D3) 1000 units CAPS, Take 1,000 Units by mouth 2 (two) times daily., Disp: , Rfl:  .  citalopram (CELEXA) 20 MG tablet, Take 20 mg by mouth daily., Disp: , Rfl:  .  ibuprofen (ADVIL,MOTRIN) 800 MG tablet, Take 1 tablet (800 mg total) by mouth every 8 (eight) hours as needed for mild pain or moderate pain., Disp: 30 tablet, Rfl: 0 .  latanoprost (XALATAN) 0.005 % ophthalmic solution, Place 1 drop into both eyes at bedtime., Disp: , Rfl:  .  levothyroxine (SYNTHROID, LEVOTHROID) 50 MCG tablet, Take 50 mcg by mouth daily before breakfast., Disp: , Rfl:  .  losartan-hydrochlorothiazide (HYZAAR) 50-12.5 MG tablet, Take 1 tablet by mouth daily., Disp: , Rfl:  .  Multiple Vitamin (  MULTIVITAMIN WITH MINERALS) TABS tablet, Take 1 tablet by mouth daily., Disp: , Rfl:   Physical exam:  Vitals:   12/18/17 1333  BP: 99/65  Pulse: (!) 102  Resp: 18  Temp: 98.2 F (36.8 C)  TempSrc: Oral  Weight: 140 lb (63.5 kg)  Height: 5' 5" (1.651 m)   Physical Exam  Constitutional: She is oriented to person, place, and time. She appears well-developed and well-nourished.  HENT:  Head: Normocephalic and atraumatic.  Eyes: Pupils are equal, round, and reactive to light. EOM are normal.  Neck: Normal range of motion.  Cardiovascular: Normal rate, regular rhythm and normal heart sounds.  Pulmonary/Chest: Effort normal and breath sounds normal.  Abdominal: Soft. Bowel sounds are normal.  Neurological: She is alert and oriented to person, place, and time.  Skin: Skin is warm and dry.  Patient is status post left lumpectomy with a well-healed surgical scar.  There is no evidence of active infection  CMP Latest Ref Rng & Units 12/09/2017  Glucose 70 - 99 mg/dL 103(H)  BUN 8 - 23 mg/dL -  Creatinine 0.44 - 1.00 mg/dL -  Sodium 135 - 145 mmol/L 140  Potassium 3.5 - 5.1 mmol/L 3.6  Chloride 98 - 111 mmol/L -  CO2 22 - 32 mmol/L -  Calcium 8.9 - 10.3 mg/dL  -  Total Protein 6.5 - 8.1 g/dL -  Total Bilirubin 0.3 - 1.2 mg/dL -  Alkaline Phos 38 - 126 U/L -  AST 15 - 41 U/L -  ALT 0 - 44 U/L -   CBC Latest Ref Rng & Units 12/09/2017  WBC 3.6 - 11.0 K/uL -  Hemoglobin 12.0 - 15.0 g/dL 11.9(L)  Hematocrit 36.0 - 46.0 % 35.0(L)  Platelets 150 - 440 K/uL -    No images are attached to the encounter.  Nm Sentinel Node Injection  Result Date: 12/09/2017 CLINICAL DATA:  LEFT breast cancer. EXAM: NUCLEAR MEDICINE BREAST LYMPHOSCINTIGRAPHY TECHNIQUE: Intradermal injection of radiopharmaceutical was performed at the 12 o'clock, 3 o'clock, 6 o'clock, and 9 o'clock positions around the LEFT nipple. The patient was then sent to the operating room. RADIOPHARMACEUTICALS:  Total of 1 mCi Millipore-filtered Technetium-3msulfur colloid, injected in four aliquots of 0.25 mCi each. IMPRESSION: Uncomplicated intradermal injection of a total of 1 mCi Technetium-978mulfur colloid for purposes of sentinel node identification. Electronically Signed   By: JeMargarette Canada.D.   On: 12/09/2017 11:02   Mm Breast Surgical Specimen  Result Date: 12/09/2017 CLINICAL DATA:  Evaluate surgical specimen following LEFT lumpectomy for breast cancer EXAM: SPECIMEN RADIOGRAPH OF THE LEFT BREAST COMPARISON:  Previous exam(s). FINDINGS: Status post excision of the left breast. The wire tip and biopsy marker clip are present and are marked for pathology. The clip lies 4 mm from the specimen edge and was relayed to Dr. SaLysle Pearlwho plans to take additional tissue. IMPRESSION: Specimen radiograph of the LEFT breast. Electronically Signed   By: JeMargarette Canada.D.   On: 12/09/2017 13:02   Mm Clip Placement Left  Result Date: 11/27/2017 CLINICAL DATA:  Post ultrasound-guided core needle biopsy of left breast 2 o'clock nodule. EXAM: DIAGNOSTIC LEFT MAMMOGRAM POST ULTRASOUND BIOPSY COMPARISON:  Previous exam(s). FINDINGS: Mammographic images were obtained following ultrasound guided biopsy of left  breast 2 o'clock nodule. Two-view mammography demonstrates presence of ribbon shaped marker within the biopsy site in appropriate mammographic position. IMPRESSION: Successful placement of ribbon shaped marker post ultrasound-guided core needle biopsy of left breast 2 o'clock nodule. Final  Assessment: Post Procedure Mammograms for Marker Placement Electronically Signed   By: Fidela Salisbury M.D.   On: 11/27/2017 12:21   [place] Lt Breast Bx W Loc Dev 1st Lesion Img Bx Spec US Guide  Addendum Date: 12/01/2017   ADDENDUM REPORT: 12/01/2017 14:51 ADDENDUM: Pathology of the left breast biopsy revealed A. BREAST, LEFT, 2 O'CLOCK 8 CM FROM NIPPLE; ULTRASOUND-GUIDED CORE BIOPSY: INVASIVE MAMMARY CARCINOMA WITH FEATURES OF MUCINOUS CARCINOMA. Size of invasive carcinoma: 4 mm in this sample. Histologic grade of invasive carcinoma: Grade 1. Ductal carcinoma in situ: Present, low-grade without necrosis. Lymphovascular invasion: Not identified. ER/PR/HER2: Immunohistochemistry will be performed on block A1, with reflex to [place] for HER2 2+. The results will be reported in an addendum. Comment: The definitive tumor classification and grade will be assigned on the excisional specimen. These findings were communicated to [facility] in Dr. Kateri Plummer office on 11/28/2017 at 1:28 PM. Read back procedure was performed. This was found to be concordant with Dr. Kateri Plummer impression and notes and confirmed by Dr. Jeanmarie Plant. Recommendation: Surgical and oncology referrals. At the patient's request, results and recommendations were relayed to the patient by phone by Dr. Jeanmarie Plant on 11/28/17. The patient stated she did well following the biopsy. Post biopsy instructions were reviewed with the patient and all of her questions were answered. She will be contacted by the nurse navigators for [facility] to arrange referrals. She was encouraged to contact the [facility] with any further questions or concerns. A  request for referrals was relayed to the nurse navigators for [facility] by [place], [place] on 11/28/17. Addendum by Jetta Lout, RRA on 12/01/17. Electronically Signed   By: Fidela Salisbury M.D.   On: 12/01/2017 14:51   Result Date: 12/01/2017 CLINICAL DATA:  Left breast 2 o'clock 4 mm nodule. EXAM: ULTRASOUND GUIDED LEFT BREAST CORE NEEDLE BIOPSY COMPARISON:  Previous exam(s). FINDINGS: I met with the patient and we discussed the procedure of ultrasound-guided biopsy, including benefits and alternatives. We discussed the high likelihood of a successful procedure. We discussed the risks of the procedure, including infection, bleeding, tissue injury, clip migration, and inadequate sampling. Informed written consent was given. The usual time-out protocol was performed immediately prior to the procedure. Lesion quadrant: Upper outer quadrant Using sterile technique and 1% Lidocaine as local anesthetic, under direct ultrasound visualization, a 14 gauge spring-loaded device was used to perform biopsy of left breast 2 o'clock nodule using a lateral approach. At the conclusion of the procedure a ribbon shaped tissue marker clip was deployed into the biopsy cavity. Follow up 2 view mammogram was performed and dictated separately. IMPRESSION: Ultrasound guided biopsy of left breast.  No apparent complications. Electronically Signed: By: Fidela Salisbury M.D. On: 11/27/2017 12:20   Mm Lt Plc Breast Loc Dev   1st Lesion  Inc Mammo Guide  Result Date: 12/09/2017 CLINICAL DATA:  72 year old female for wire localization of LEFT breast cancer prior to lumpectomy EXAM: NEEDLE LOCALIZATION OF THE LEFT BREAST WITH MAMMO GUIDANCE COMPARISON:  Previous exams. FINDINGS: Patient presents for needle localization prior to LEFT lumpectomy. I met with the patient and we discussed the procedure of needle localization including benefits and alternatives. We discussed the high likelihood of a successful  procedure. We discussed the risks of the procedure, including infection, bleeding, tissue injury, and further surgery. Informed, written consent was given. The usual time-out protocol was performed immediately prior to the procedure. Using mammographic guidance, sterile technique, 1% lidocaine and a  7 cm modified Kopans needle, the RIBBON biopsy clip was localized using LATERAL approach. The images were marked for Dr. [name]. IMPRESSION: Needle localization LEFT breast. No apparent complications. Electronically Signed   By: Margarette Canada M.D.   On: 12/09/2017 09:03     Assessment and plan- Patient is a 72 y.o. female with newly diagnosed pathological prognostic stage IA pT1b pN0 cM0 invasive mucinous carcinoma of the left breast ER PR positive HER-2/neu negative status post lumpectomy  I discussed the results of the pathology with the patient in detail.  Overall patient had a grade 1 7 mm invasive mucinous carcinoma with negative margins.  Sentinel lymph node biopsy was negative for malignancy.  Patient does not need Oncotype testing at this time given that her tumor was less than 10 mm and grade 1.  She will be meeting Dr. Donella Stade later today to discuss adjuvant radiation treatment.  She is keen on MammoSite as she has read about it.  I have encouraged patient to speak to Dr. Donella Stade about this.  I will see the patient in 2 months time upon completion of radiation treatment to talk about hormone therapy in greater detail.  Given that her tumor was ER PR positive there would be a role for hormone therapy for 5 years.  Her bone density scan from 2019 showed T score of -1.6 at the left hip consistent with osteopenia with a 10-year probability of a major hip fracture at 2.1% and 11% 10-year risk of any major fracture.  Treatment will be given with a curative intent    Visit Diagnosis 1. Invasive carcinoma of breast ([place])   2. Goals of care, counseling/discussion      Dr. Randa Evens, MD, MPH [facility] at  [facility] ([place]) [phone] 12/19/2017 1:13 PM

## 2017-12-23 ENCOUNTER — Telehealth: Payer: Self-pay | Admitting: *Deleted

## 2017-12-23 NOTE — Telephone Encounter (Signed)
She is not going to start hormone therapy for almost 6-8 weeks. That should interfere with tooth extraction anyways. She is good to go from my standpoint

## 2017-12-23 NOTE — Telephone Encounter (Signed)
Call to Dr Kendrick Ranch office and spoke with receptionist that Dr Janese Banks is not going to start treatment for 6 - 8 weeks and there should be no problem with her having teeth extracted.

## 2017-12-23 NOTE — Telephone Encounter (Signed)
Patient went to dentist this morning and needs to be referred to Oral surgeon for extraction of 4 teeth which are causing her pain. She needs for Dr Janese Banks and Dr Baruch Gouty both to call Dr Altha Harm [phone] and tell him the exact plan for treatment here is to be. Dr Janese Banks does not list specifically the hormone treatment she is going to use. Please call Dr Altha Harm 1:30 today if at all possible per patient request.

## 2017-12-25 ENCOUNTER — Ambulatory Visit
Admission: RE | Admit: 2017-12-25 | Discharge: 2017-12-25 | Disposition: A | Discharge status: Home / Self Care | Payer: PPO | Source: Ambulatory Visit | Attending: Radiation Oncology | Admitting: Radiation Oncology

## 2017-12-25 DIAGNOSIS — C50412 Malignant neoplasm of upper-outer quadrant of left female breast: Secondary | ICD-10-CM | POA: Insufficient documentation

## 2017-12-25 DIAGNOSIS — Z510 Encounter for antineoplastic radiation therapy: Secondary | ICD-10-CM | POA: Diagnosis not present

## 2017-12-25 DIAGNOSIS — Z170 Estrogen receptor positive status [ER+]: Secondary | ICD-10-CM | POA: Insufficient documentation

## 2017-12-26 DIAGNOSIS — C50412 Malignant neoplasm of upper-outer quadrant of left female breast: Secondary | ICD-10-CM | POA: Diagnosis not present

## 2017-12-26 DIAGNOSIS — Z510 Encounter for antineoplastic radiation therapy: Secondary | ICD-10-CM | POA: Diagnosis not present

## 2017-12-26 DIAGNOSIS — Z170 Estrogen receptor positive status [ER+]: Secondary | ICD-10-CM | POA: Diagnosis not present

## 2018-01-01 ENCOUNTER — Ambulatory Visit
Admission: RE | Admit: 2018-01-01 | Discharge: 2018-01-01 | Disposition: A | Discharge status: Home / Self Care | Payer: PPO | Source: Ambulatory Visit | Attending: Radiation Oncology | Admitting: Radiation Oncology

## 2018-01-01 DIAGNOSIS — Z510 Encounter for antineoplastic radiation therapy: Secondary | ICD-10-CM | POA: Diagnosis not present

## 2018-01-01 DIAGNOSIS — C50412 Malignant neoplasm of upper-outer quadrant of left female breast: Secondary | ICD-10-CM | POA: Diagnosis not present

## 2018-01-01 DIAGNOSIS — Z170 Estrogen receptor positive status [ER+]: Secondary | ICD-10-CM | POA: Diagnosis not present

## 2018-01-05 ENCOUNTER — Ambulatory Visit
Admission: RE | Admit: 2018-01-05 | Discharge: 2018-01-05 | Disposition: A | Discharge status: Home / Self Care | Payer: PPO | Source: Ambulatory Visit | Attending: Radiation Oncology | Admitting: Radiation Oncology

## 2018-01-05 DIAGNOSIS — Z170 Estrogen receptor positive status [ER+]: Secondary | ICD-10-CM | POA: Diagnosis not present

## 2018-01-05 DIAGNOSIS — Z510 Encounter for antineoplastic radiation therapy: Secondary | ICD-10-CM | POA: Diagnosis not present

## 2018-01-05 DIAGNOSIS — C50412 Malignant neoplasm of upper-outer quadrant of left female breast: Secondary | ICD-10-CM | POA: Diagnosis not present

## 2018-01-06 ENCOUNTER — Ambulatory Visit
Admission: RE | Admit: 2018-01-06 | Discharge: 2018-01-06 | Disposition: A | Discharge status: Home / Self Care | Payer: PPO | Source: Ambulatory Visit | Attending: Radiation Oncology | Admitting: Radiation Oncology

## 2018-01-06 DIAGNOSIS — Z170 Estrogen receptor positive status [ER+]: Secondary | ICD-10-CM | POA: Diagnosis not present

## 2018-01-06 DIAGNOSIS — Z510 Encounter for antineoplastic radiation therapy: Secondary | ICD-10-CM | POA: Diagnosis not present

## 2018-01-06 DIAGNOSIS — C50412 Malignant neoplasm of upper-outer quadrant of left female breast: Secondary | ICD-10-CM | POA: Diagnosis not present

## 2018-01-07 ENCOUNTER — Ambulatory Visit
Admission: RE | Admit: 2018-01-07 | Discharge: 2018-01-07 | Disposition: A | Discharge status: Home / Self Care | Payer: PPO | Source: Ambulatory Visit | Attending: Radiation Oncology | Admitting: Radiation Oncology

## 2018-01-07 DIAGNOSIS — C50412 Malignant neoplasm of upper-outer quadrant of left female breast: Secondary | ICD-10-CM | POA: Diagnosis not present

## 2018-01-07 DIAGNOSIS — Z170 Estrogen receptor positive status [ER+]: Secondary | ICD-10-CM | POA: Diagnosis not present

## 2018-01-07 DIAGNOSIS — Z510 Encounter for antineoplastic radiation therapy: Secondary | ICD-10-CM | POA: Diagnosis not present

## 2018-01-08 ENCOUNTER — Ambulatory Visit
Admission: RE | Admit: 2018-01-08 | Discharge: 2018-01-08 | Disposition: A | Discharge status: Home / Self Care | Payer: PPO | Source: Ambulatory Visit | Attending: Radiation Oncology | Admitting: Radiation Oncology

## 2018-01-08 DIAGNOSIS — Z170 Estrogen receptor positive status [ER+]: Secondary | ICD-10-CM | POA: Diagnosis not present

## 2018-01-08 DIAGNOSIS — Z510 Encounter for antineoplastic radiation therapy: Secondary | ICD-10-CM | POA: Diagnosis not present

## 2018-01-08 DIAGNOSIS — C50412 Malignant neoplasm of upper-outer quadrant of left female breast: Secondary | ICD-10-CM | POA: Diagnosis not present

## 2018-01-09 ENCOUNTER — Other Ambulatory Visit: Payer: Self-pay | Admitting: *Deleted

## 2018-01-09 ENCOUNTER — Ambulatory Visit
Admission: RE | Admit: 2018-01-09 | Discharge: 2018-01-09 | Disposition: A | Discharge status: Home / Self Care | Payer: PPO | Source: Ambulatory Visit | Attending: Radiation Oncology | Admitting: Radiation Oncology

## 2018-01-09 DIAGNOSIS — Z170 Estrogen receptor positive status [ER+]: Secondary | ICD-10-CM | POA: Diagnosis not present

## 2018-01-09 DIAGNOSIS — Z510 Encounter for antineoplastic radiation therapy: Secondary | ICD-10-CM | POA: Diagnosis not present

## 2018-01-09 DIAGNOSIS — C50412 Malignant neoplasm of upper-outer quadrant of left female breast: Secondary | ICD-10-CM | POA: Diagnosis not present

## 2018-01-09 MED ORDER — PROMETHAZINE HCL 25 MG PO TABS: 25.0000 mg | ORAL_TABLET | Freq: Four times a day (QID) | ORAL | 1 refills | Status: DC | PRN

## 2018-01-09 MED ORDER — LANSOPRAZOLE 30 MG PO CPDR: 30.0000 mg | DELAYED_RELEASE_CAPSULE | Freq: Every day | ORAL | 2 refills | Status: DC

## 2018-01-12 ENCOUNTER — Ambulatory Visit
Admission: RE | Admit: 2018-01-12 | Discharge: 2018-01-12 | Disposition: A | Discharge status: Home / Self Care | Payer: PPO | Source: Ambulatory Visit | Attending: Radiation Oncology | Admitting: Radiation Oncology

## 2018-01-12 DIAGNOSIS — Z510 Encounter for antineoplastic radiation therapy: Secondary | ICD-10-CM | POA: Diagnosis not present

## 2018-01-12 DIAGNOSIS — Z170 Estrogen receptor positive status [ER+]: Secondary | ICD-10-CM | POA: Diagnosis not present

## 2018-01-12 DIAGNOSIS — C50412 Malignant neoplasm of upper-outer quadrant of left female breast: Secondary | ICD-10-CM | POA: Diagnosis not present

## 2018-01-13 ENCOUNTER — Ambulatory Visit
Admission: RE | Admit: 2018-01-13 | Discharge: 2018-01-13 | Disposition: A | Discharge status: Home / Self Care | Payer: PPO | Source: Ambulatory Visit | Attending: Radiation Oncology | Admitting: Radiation Oncology

## 2018-01-13 DIAGNOSIS — Z170 Estrogen receptor positive status [ER+]: Secondary | ICD-10-CM | POA: Diagnosis not present

## 2018-01-13 DIAGNOSIS — Z510 Encounter for antineoplastic radiation therapy: Secondary | ICD-10-CM | POA: Diagnosis not present

## 2018-01-13 DIAGNOSIS — C50412 Malignant neoplasm of upper-outer quadrant of left female breast: Secondary | ICD-10-CM | POA: Diagnosis not present

## 2018-01-14 ENCOUNTER — Ambulatory Visit
Admission: RE | Admit: 2018-01-14 | Discharge: 2018-01-14 | Disposition: A | Discharge status: Home / Self Care | Payer: PPO | Source: Ambulatory Visit | Attending: Radiation Oncology | Admitting: Radiation Oncology

## 2018-01-14 DIAGNOSIS — Z170 Estrogen receptor positive status [ER+]: Secondary | ICD-10-CM | POA: Diagnosis not present

## 2018-01-14 DIAGNOSIS — Z510 Encounter for antineoplastic radiation therapy: Secondary | ICD-10-CM | POA: Diagnosis not present

## 2018-01-14 DIAGNOSIS — C50412 Malignant neoplasm of upper-outer quadrant of left female breast: Secondary | ICD-10-CM | POA: Diagnosis not present

## 2018-01-15 ENCOUNTER — Ambulatory Visit
Admission: RE | Admit: 2018-01-15 | Discharge: 2018-01-15 | Disposition: A | Discharge status: Home / Self Care | Payer: PPO | Source: Ambulatory Visit | Attending: Radiation Oncology | Admitting: Radiation Oncology

## 2018-01-15 ENCOUNTER — Ambulatory Visit: Payer: PPO

## 2018-01-15 DIAGNOSIS — Z510 Encounter for antineoplastic radiation therapy: Secondary | ICD-10-CM | POA: Diagnosis not present

## 2018-01-15 DIAGNOSIS — C50412 Malignant neoplasm of upper-outer quadrant of left female breast: Secondary | ICD-10-CM | POA: Diagnosis not present

## 2018-01-15 DIAGNOSIS — Z170 Estrogen receptor positive status [ER+]: Secondary | ICD-10-CM | POA: Diagnosis not present

## 2018-01-16 ENCOUNTER — Ambulatory Visit
Admission: RE | Admit: 2018-01-16 | Discharge: 2018-01-16 | Disposition: A | Discharge status: Home / Self Care | Payer: PPO | Source: Ambulatory Visit | Attending: Radiation Oncology | Admitting: Radiation Oncology

## 2018-01-16 DIAGNOSIS — H401132 Primary open-angle glaucoma, bilateral, moderate stage: Secondary | ICD-10-CM | POA: Diagnosis not present

## 2018-01-16 DIAGNOSIS — H2513 Age-related nuclear cataract, bilateral: Secondary | ICD-10-CM | POA: Diagnosis not present

## 2018-01-16 DIAGNOSIS — H18413 Arcus senilis, bilateral: Secondary | ICD-10-CM | POA: Diagnosis not present

## 2018-01-16 DIAGNOSIS — Z170 Estrogen receptor positive status [ER+]: Secondary | ICD-10-CM | POA: Diagnosis not present

## 2018-01-16 DIAGNOSIS — Z510 Encounter for antineoplastic radiation therapy: Secondary | ICD-10-CM | POA: Diagnosis not present

## 2018-01-16 DIAGNOSIS — C50412 Malignant neoplasm of upper-outer quadrant of left female breast: Secondary | ICD-10-CM | POA: Diagnosis not present

## 2018-01-19 ENCOUNTER — Ambulatory Visit
Admission: RE | Admit: 2018-01-19 | Discharge: 2018-01-19 | Disposition: A | Discharge status: Home / Self Care | Payer: PPO | Source: Ambulatory Visit | Attending: Radiation Oncology | Admitting: Radiation Oncology

## 2018-01-19 DIAGNOSIS — C50412 Malignant neoplasm of upper-outer quadrant of left female breast: Secondary | ICD-10-CM | POA: Diagnosis not present

## 2018-01-19 DIAGNOSIS — Z510 Encounter for antineoplastic radiation therapy: Secondary | ICD-10-CM | POA: Diagnosis not present

## 2018-01-19 DIAGNOSIS — Z170 Estrogen receptor positive status [ER+]: Secondary | ICD-10-CM | POA: Diagnosis not present

## 2018-01-20 ENCOUNTER — Ambulatory Visit
Admission: RE | Admit: 2018-01-20 | Discharge: 2018-01-20 | Disposition: A | Discharge status: Home / Self Care | Payer: PPO | Source: Ambulatory Visit | Attending: Radiation Oncology | Admitting: Radiation Oncology

## 2018-01-20 DIAGNOSIS — Z170 Estrogen receptor positive status [ER+]: Secondary | ICD-10-CM | POA: Diagnosis not present

## 2018-01-20 DIAGNOSIS — C50412 Malignant neoplasm of upper-outer quadrant of left female breast: Secondary | ICD-10-CM | POA: Diagnosis not present

## 2018-01-20 DIAGNOSIS — Z510 Encounter for antineoplastic radiation therapy: Secondary | ICD-10-CM | POA: Diagnosis not present

## 2018-01-21 ENCOUNTER — Ambulatory Visit
Admission: RE | Admit: 2018-01-21 | Discharge: 2018-01-21 | Disposition: A | Discharge status: Home / Self Care | Payer: PPO | Source: Ambulatory Visit | Attending: Radiation Oncology | Admitting: Radiation Oncology

## 2018-01-21 ENCOUNTER — Ambulatory Visit: Payer: PPO

## 2018-01-21 DIAGNOSIS — Z170 Estrogen receptor positive status [ER+]: Secondary | ICD-10-CM | POA: Diagnosis not present

## 2018-01-21 DIAGNOSIS — C50412 Malignant neoplasm of upper-outer quadrant of left female breast: Secondary | ICD-10-CM | POA: Diagnosis not present

## 2018-01-21 DIAGNOSIS — Z510 Encounter for antineoplastic radiation therapy: Secondary | ICD-10-CM | POA: Diagnosis not present

## 2018-01-22 ENCOUNTER — Ambulatory Visit
Admission: RE | Admit: 2018-01-22 | Discharge: 2018-01-22 | Disposition: A | Discharge status: Home / Self Care | Payer: PPO | Source: Ambulatory Visit | Attending: Radiation Oncology | Admitting: Radiation Oncology

## 2018-01-22 ENCOUNTER — Ambulatory Visit: Payer: PPO

## 2018-01-22 DIAGNOSIS — Z510 Encounter for antineoplastic radiation therapy: Secondary | ICD-10-CM | POA: Diagnosis not present

## 2018-01-22 DIAGNOSIS — C50412 Malignant neoplasm of upper-outer quadrant of left female breast: Secondary | ICD-10-CM | POA: Diagnosis not present

## 2018-01-22 DIAGNOSIS — Z170 Estrogen receptor positive status [ER+]: Secondary | ICD-10-CM | POA: Diagnosis not present

## 2018-01-23 ENCOUNTER — Ambulatory Visit: Payer: PPO

## 2018-01-23 ENCOUNTER — Ambulatory Visit
Admission: RE | Admit: 2018-01-23 | Discharge: 2018-01-23 | Disposition: A | Discharge status: Home / Self Care | Payer: PPO | Source: Ambulatory Visit | Attending: Radiation Oncology | Admitting: Radiation Oncology

## 2018-01-23 DIAGNOSIS — C50412 Malignant neoplasm of upper-outer quadrant of left female breast: Secondary | ICD-10-CM | POA: Insufficient documentation

## 2018-01-23 DIAGNOSIS — Z510 Encounter for antineoplastic radiation therapy: Secondary | ICD-10-CM | POA: Insufficient documentation

## 2018-01-23 DIAGNOSIS — Z170 Estrogen receptor positive status [ER+]: Secondary | ICD-10-CM | POA: Insufficient documentation

## 2018-01-26 ENCOUNTER — Ambulatory Visit
Admission: RE | Admit: 2018-01-26 | Discharge: 2018-01-26 | Disposition: A | Discharge status: Home / Self Care | Payer: PPO | Source: Ambulatory Visit | Attending: Radiation Oncology | Admitting: Radiation Oncology

## 2018-01-26 ENCOUNTER — Ambulatory Visit: Payer: PPO

## 2018-01-26 DIAGNOSIS — C50412 Malignant neoplasm of upper-outer quadrant of left female breast: Secondary | ICD-10-CM | POA: Diagnosis not present

## 2018-01-26 DIAGNOSIS — Z170 Estrogen receptor positive status [ER+]: Secondary | ICD-10-CM | POA: Diagnosis not present

## 2018-01-26 DIAGNOSIS — Z510 Encounter for antineoplastic radiation therapy: Secondary | ICD-10-CM | POA: Diagnosis not present

## 2018-01-27 ENCOUNTER — Ambulatory Visit: Payer: PPO

## 2018-01-27 ENCOUNTER — Ambulatory Visit
Admission: RE | Admit: 2018-01-27 | Discharge: 2018-01-27 | Disposition: A | Discharge status: Home / Self Care | Payer: PPO | Source: Ambulatory Visit | Attending: Radiation Oncology | Admitting: Radiation Oncology

## 2018-01-27 DIAGNOSIS — Z510 Encounter for antineoplastic radiation therapy: Secondary | ICD-10-CM | POA: Diagnosis not present

## 2018-01-28 ENCOUNTER — Ambulatory Visit: Payer: PPO

## 2018-01-28 ENCOUNTER — Ambulatory Visit
Admission: RE | Admit: 2018-01-28 | Discharge: 2018-01-28 | Disposition: A | Discharge status: Home / Self Care | Payer: PPO | Source: Ambulatory Visit | Attending: Radiation Oncology | Admitting: Radiation Oncology

## 2018-01-28 DIAGNOSIS — Z510 Encounter for antineoplastic radiation therapy: Secondary | ICD-10-CM | POA: Diagnosis not present

## 2018-01-29 ENCOUNTER — Ambulatory Visit
Admission: RE | Admit: 2018-01-29 | Discharge: 2018-01-29 | Disposition: A | Discharge status: Home / Self Care | Payer: PPO | Source: Ambulatory Visit | Attending: Radiation Oncology | Admitting: Radiation Oncology

## 2018-01-29 ENCOUNTER — Ambulatory Visit: Payer: PPO

## 2018-01-29 DIAGNOSIS — Z510 Encounter for antineoplastic radiation therapy: Secondary | ICD-10-CM | POA: Diagnosis not present

## 2018-01-30 ENCOUNTER — Ambulatory Visit
Admission: RE | Admit: 2018-01-30 | Discharge: 2018-01-30 | Disposition: A | Discharge status: Home / Self Care | Payer: PPO | Source: Ambulatory Visit | Attending: Radiation Oncology | Admitting: Radiation Oncology

## 2018-01-30 ENCOUNTER — Ambulatory Visit: Payer: PPO

## 2018-01-30 DIAGNOSIS — L589 Radiodermatitis, unspecified: Secondary | ICD-10-CM | POA: Diagnosis not present

## 2018-01-30 DIAGNOSIS — D2272 Melanocytic nevi of left lower limb, including hip: Secondary | ICD-10-CM | POA: Diagnosis not present

## 2018-01-30 DIAGNOSIS — D2261 Melanocytic nevi of right upper limb, including shoulder: Secondary | ICD-10-CM | POA: Diagnosis not present

## 2018-01-30 DIAGNOSIS — D2271 Melanocytic nevi of right lower limb, including hip: Secondary | ICD-10-CM | POA: Diagnosis not present

## 2018-01-30 DIAGNOSIS — Z170 Estrogen receptor positive status [ER+]: Secondary | ICD-10-CM | POA: Diagnosis not present

## 2018-01-30 DIAGNOSIS — D2262 Melanocytic nevi of left upper limb, including shoulder: Secondary | ICD-10-CM | POA: Diagnosis not present

## 2018-01-30 DIAGNOSIS — D225 Melanocytic nevi of trunk: Secondary | ICD-10-CM | POA: Diagnosis not present

## 2018-01-30 DIAGNOSIS — Z510 Encounter for antineoplastic radiation therapy: Secondary | ICD-10-CM | POA: Diagnosis not present

## 2018-01-30 DIAGNOSIS — C50412 Malignant neoplasm of upper-outer quadrant of left female breast: Secondary | ICD-10-CM | POA: Diagnosis not present

## 2018-02-02 ENCOUNTER — Ambulatory Visit: Payer: PPO

## 2018-02-02 ENCOUNTER — Ambulatory Visit
Admission: RE | Admit: 2018-02-02 | Discharge: 2018-02-02 | Disposition: A | Discharge status: Home / Self Care | Payer: PPO | Source: Ambulatory Visit | Attending: Radiation Oncology | Admitting: Radiation Oncology

## 2018-02-02 DIAGNOSIS — Z510 Encounter for antineoplastic radiation therapy: Secondary | ICD-10-CM | POA: Diagnosis not present

## 2018-02-03 ENCOUNTER — Ambulatory Visit: Payer: PPO

## 2018-02-03 ENCOUNTER — Ambulatory Visit
Admission: RE | Admit: 2018-02-03 | Discharge: 2018-02-03 | Disposition: A | Discharge status: Home / Self Care | Payer: PPO | Source: Ambulatory Visit | Attending: Radiation Oncology | Admitting: Radiation Oncology

## 2018-02-03 DIAGNOSIS — Z510 Encounter for antineoplastic radiation therapy: Secondary | ICD-10-CM | POA: Diagnosis not present

## 2018-02-04 ENCOUNTER — Ambulatory Visit
Admission: RE | Admit: 2018-02-04 | Discharge: 2018-02-04 | Disposition: A | Discharge status: Home / Self Care | Payer: PPO | Source: Ambulatory Visit | Attending: Radiation Oncology | Admitting: Radiation Oncology

## 2018-02-04 ENCOUNTER — Ambulatory Visit: Payer: PPO

## 2018-02-04 DIAGNOSIS — Z510 Encounter for antineoplastic radiation therapy: Secondary | ICD-10-CM | POA: Diagnosis not present

## 2018-02-05 ENCOUNTER — Ambulatory Visit: Payer: PPO

## 2018-02-05 ENCOUNTER — Ambulatory Visit
Admission: RE | Admit: 2018-02-05 | Discharge: 2018-02-05 | Disposition: A | Discharge status: Home / Self Care | Payer: PPO | Source: Ambulatory Visit | Attending: Radiation Oncology | Admitting: Radiation Oncology

## 2018-02-05 DIAGNOSIS — Z170 Estrogen receptor positive status [ER+]: Secondary | ICD-10-CM | POA: Diagnosis not present

## 2018-02-05 DIAGNOSIS — Z510 Encounter for antineoplastic radiation therapy: Secondary | ICD-10-CM | POA: Diagnosis not present

## 2018-02-05 DIAGNOSIS — C50412 Malignant neoplasm of upper-outer quadrant of left female breast: Secondary | ICD-10-CM | POA: Diagnosis not present

## 2018-02-06 ENCOUNTER — Ambulatory Visit: Payer: PPO

## 2018-02-09 ENCOUNTER — Ambulatory Visit: Payer: PPO

## 2018-02-10 ENCOUNTER — Ambulatory Visit: Payer: PPO

## 2018-02-11 ENCOUNTER — Ambulatory Visit: Payer: PPO

## 2018-02-12 ENCOUNTER — Ambulatory Visit: Payer: PPO

## 2018-02-13 ENCOUNTER — Inpatient Hospital Stay (HOSPITAL_BASED_OUTPATIENT_CLINIC_OR_DEPARTMENT_OTHER): Payer: PPO | Admitting: Oncology

## 2018-02-13 ENCOUNTER — Inpatient Hospital Stay: Payer: PPO | Attending: Oncology

## 2018-02-13 ENCOUNTER — Ambulatory Visit: Payer: PPO

## 2018-02-13 ENCOUNTER — Encounter: Payer: Self-pay | Admitting: *Deleted

## 2018-02-13 ENCOUNTER — Encounter: Payer: Self-pay | Admitting: Oncology

## 2018-02-13 VITALS — BP 133/86 | HR 97 | Temp 98.6°F | Resp 18 | Ht 65.0 in

## 2018-02-13 DIAGNOSIS — Z7189 Other specified counseling: Secondary | ICD-10-CM

## 2018-02-13 DIAGNOSIS — C50412 Malignant neoplasm of upper-outer quadrant of left female breast: Secondary | ICD-10-CM

## 2018-02-13 DIAGNOSIS — I10 Essential (primary) hypertension: Secondary | ICD-10-CM

## 2018-02-13 DIAGNOSIS — E039 Hypothyroidism, unspecified: Secondary | ICD-10-CM | POA: Diagnosis not present

## 2018-02-13 DIAGNOSIS — Z87891 Personal history of nicotine dependence: Secondary | ICD-10-CM | POA: Diagnosis not present

## 2018-02-13 DIAGNOSIS — Z170 Estrogen receptor positive status [ER+]: Secondary | ICD-10-CM

## 2018-02-13 DIAGNOSIS — C50919 Malignant neoplasm of unspecified site of unspecified female breast: Secondary | ICD-10-CM

## 2018-02-13 LAB — CBC WITH DIFFERENTIAL/PLATELET
Abs Immature Granulocytes: 0.01 10*3/uL (ref 0.00–0.07)
BASOS ABS: 0.1 10*3/uL (ref 0.0–0.1)
BASOS PCT: 1 %
EOS PCT: 3 %
Eosinophils Absolute: 0.1 10*3/uL (ref 0.0–0.5)
HCT: 36 % (ref 36.0–46.0)
Hemoglobin: 12.2 g/dL (ref 12.0–15.0)
Immature Granulocytes: 0 %
Lymphocytes Relative: 23 %
Lymphs Abs: 1 10*3/uL (ref 0.7–4.0)
MCH: 31.4 pg (ref 26.0–34.0)
MCHC: 33.9 g/dL (ref 30.0–36.0)
MCV: 92.5 fL (ref 80.0–100.0)
Monocytes Absolute: 0.8 10*3/uL (ref 0.1–1.0)
Monocytes Relative: 18 %
NRBC: 0 % (ref 0.0–0.2)
Neutro Abs: 2.4 10*3/uL (ref 1.7–7.7)
Neutrophils Relative %: 55 %
Platelets: 282 10*3/uL (ref 150–400)
RBC: 3.89 MIL/uL (ref 3.87–5.11)
RDW: 13.8 % (ref 11.5–15.5)
WBC: 4.4 10*3/uL (ref 4.0–10.5)

## 2018-02-13 LAB — COMPREHENSIVE METABOLIC PANEL
ALBUMIN: 4.3 g/dL (ref 3.5–5.0)
ALT: 40 U/L (ref 0–44)
ANION GAP: 9 (ref 5–15)
AST: 40 U/L (ref 15–41)
Alkaline Phosphatase: 97 U/L (ref 38–126)
BUN: 10 mg/dL (ref 8–23)
CALCIUM: 9.5 mg/dL (ref 8.9–10.3)
CO2: 29 mmol/L (ref 22–32)
CREATININE: 0.57 mg/dL (ref 0.44–1.00)
Chloride: 95 mmol/L — ABNORMAL LOW (ref 98–111)
GFR calc non Af Amer: >60 mL/min (ref >60)
GLUCOSE: 100 mg/dL — AB (ref 70–99)
POTASSIUM: 3.8 mmol/L (ref 3.5–5.1)
SODIUM: 133 mmol/L — AB (ref 135–145)
TOTAL PROTEIN: 7.6 g/dL (ref 6.5–8.1)
Total Bilirubin: 0.7 mg/dL (ref 0.3–1.2)

## 2018-02-13 MED ORDER — ANASTROZOLE 1 MG PO TABS: 1.0000 mg | ORAL_TABLET | Freq: Every day | ORAL | 1 refills | Status: DC

## 2018-02-13 NOTE — Progress Notes (Signed)
No new changes noted today 

## 2018-02-16 ENCOUNTER — Ambulatory Visit: Payer: PPO

## 2018-02-16 NOTE — Progress Notes (Signed)
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 Diagnostic mammogram and ultrasound of the left breast showed 3 x 4 x 3 mm hypoechoic mass at the 2 o'clock position. No pathologic left axillary adenopathy. 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 Patient has seen Dr. [name] and will be undergoing lumpectomy soon. No prior history of breast cancer or breast biopsies. Menarche at the age of 75. Menopause was close to 10 years ago. She is G2, P2. She did not breast-feed. No family history of breast, colon, pancreatic cancer.States that her mother had some cancer and she is not sure if it was ovarian cancer  Patient had a grade 1, 7 mm invasive mucinous carcinoma with negative margins.  Sentinel lymph node biopsy was negative for malignancy.  Patient did not need Oncotype testing at this time given that her tumor was less than 10 mm and grade 1. She completed adjuvant RT  Interval history-patient is recovering well from her adjuvant radiation treatment.  She denies any specific complaints today other than some fatigue  ECOG PS- 1 Pain scale- 0 Opioid associated constipation- no  Review of systems- Review of Systems  Constitutional: Positive for malaise/fatigue. Negative  for chills, fever and weight loss.  HENT: Negative for congestion, ear discharge and nosebleeds.   Eyes: Negative for blurred vision.  Respiratory: Negative for cough, hemoptysis, sputum production, shortness of breath and wheezing.   Cardiovascular: Negative for chest pain, palpitations, orthopnea and claudication.  Gastrointestinal: Negative for abdominal pain, blood in stool, constipation, diarrhea, heartburn, melena, nausea and vomiting.  Genitourinary: Negative for dysuria, flank pain, frequency, hematuria and urgency.  Musculoskeletal: Negative for back pain, joint pain and myalgias.  Skin: Negative for rash.  Neurological: Negative for dizziness, tingling, focal weakness, seizures, weakness and headaches.  Endo/Heme/Allergies: Does not bruise/bleed easily.  Psychiatric/Behavioral: Negative for depression and suicidal ideas. The patient does not have insomnia.       Allergies  Allergen Reactions  . Penicillins Hives, Swelling and Other (See Comments)    "knots" on legs Has patient had a PCN reaction causing immediate rash, facial/tongue/throat swelling, SOB or lightheadedness with hypotension: No Has patient had a PCN reaction causing severe rash involving mucus membranes or skin necrosis: No Has patient had a PCN reaction that required hospitalization: No Has patient had a PCN reaction occurring within the last 10 years: No If all of the above answers are "NO", then may proceed with Cephalosporin use.    [name] Oxycodone-Acetaminophen Anxiety and Other (See Comments)    confusion     Past Medical History:  Diagnosis Date  . [place]   . Complication of anesthesia   . Hyperlipidemia   . Hypertension   . Hypothyroidism   . PONV (postoperative nausea and vomiting)      Past Surgical History:  Procedure  Laterality Date  . BACK SURGERY     lumbar ruptured disc  . BREAST BIOPSY Left 9/5/20019   u/s core bc path pending  . BREAST LUMPECTOMY Left 12/09/2017   NL  with SN  . FOOT SURGERY Bilateral    x 3 bunionectomy  . PARTIAL MASTECTOMY WITH NEEDLE LOCALIZATION AND AXILLARY SENTINEL LYMPH NODE BX Left 12/09/2017   Procedure: PARTIAL MASTECTOMY WITH NEEDLE LOCALIZATION AND AXILLARY SENTINEL LYMPH NODE BX;  Surgeon: Benjamine Sprague, DO;  Location: [facility] ORS;  Service: General;  Laterality: Left;    Social History   Socioeconomic History  . Marital status: Married    Spouse name: Not on file  . Number of children: Not on file  . Years of education: Not on file  . Highest education level: Not on file  Occupational History  . Not on file  Social Needs  . Financial resource strain: Not on file  . Food insecurity:    Worry: Not on file    Inability: Not on file  . Transportation needs:    Medical: Not on file    Non-medical: Not on file  Tobacco Use  . Smoking status: Former Smoker    Types: Cigarettes    Last attempt to quit: 12/08/1968    Years since quitting: 49.2  . Smokeless tobacco: Never Used  . Tobacco comment: quit smoking over 50 yrs ago  Substance and Sexual Activity  . Alcohol use: Yes    Comment: rare  . Drug use: Never  . Sexual activity: Not on file  Lifestyle  . Physical activity:    Days per week: Not on file    Minutes per session: Not on file  . Stress: Not on file  Relationships  . Social connections:    Talks on phone: Not on file    Gets together: Not on file    Attends religious service: Not on file    Active member of club or organization: Not on file    Attends meetings of clubs or organizations: Not on file    Relationship status: Not on file  . Intimate partner violence:    Fear of current or ex partner: Not on file    Emotionally abused: Not on file    Physically abused: Not on file    Forced sexual activity: Not on file  Other Topics Concern  . Not on file  Social History Narrative  . Not on file    Family History  Problem Relation Age of Onset  . Ovarian cancer Mother   . Heart disease Father     . Diabetes Father   . Breast cancer Neg Hx      Current Outpatient Medications:  .  atorvastatin (LIPITOR) 80 MG tablet, Take 80 mg by mouth daily., Disp: , Rfl:  .  Cholecalciferol (VITAMIN D3) 1000 units CAPS, Take 1,000 Units by mouth 2 (two) times daily., Disp: , Rfl:  .  citalopram (CELEXA) 20 MG tablet, Take 20 mg by mouth daily., Disp: , Rfl:  .  lansoprazole (PREVACID) 30 MG capsule, Take 1 capsule (30 mg total) by mouth daily at 12 noon., Disp: 30 capsule, Rfl: 2 .  latanoprost (XALATAN) 0.005 % ophthalmic solution, Place 1 drop into both eyes at bedtime., Disp: , Rfl:  .  levothyroxine (SYNTHROID, LEVOTHROID) 50 MCG tablet, Take 50 mcg by mouth daily before breakfast., Disp: , Rfl:  .  losartan-hydrochlorothiazide (HYZAAR) 50-12.5 MG tablet, Take 1 tablet by mouth daily., Disp: , Rfl:  .  Multiple Vitamin (MULTIVITAMIN WITH MINERALS) TABS tablet, Take 1 tablet by mouth daily., Disp: , Rfl:  .  anastrozole (ARIMIDEX) 1 MG tablet, Take 1 tablet (1 mg total) by mouth daily., Disp: 30 tablet, Rfl: 1 .  ibuprofen (ADVIL,MOTRIN) 800 MG tablet, Take 1 tablet (800 mg total) by mouth every 8 (eight) hours as needed for mild pain or moderate pain. (Patient not taking: Reported on 02/13/2018), Disp: 30 tablet, Rfl: 0 .  promethazine (PHENERGAN) 25 MG tablet, Take 1 tablet (25 mg total) by mouth every 6 (six) hours as needed for nausea or vomiting. (Patient not taking: Reported on 02/13/2018), Disp: 40 tablet, Rfl: 1  Physical exam:  Vitals:   02/13/18 1143  BP: 133/86  Pulse: 97  Resp: 18  Temp: 98.6 F (37 C)  TempSrc: Tympanic  SpO2: 96%  Height: 5' 5"  (1.651 m)   Physical Exam  Constitutional: She is oriented to person, place, and time. She appears well-developed and well-nourished.  HENT:  Head: Normocephalic and atraumatic.  Eyes: Pupils are equal, round, and reactive to light. EOM are normal.  Neck: Normal range of motion.  Cardiovascular: Normal rate, regular rhythm and  normal heart sounds.  Pulmonary/Chest: Effort normal and breath sounds normal.  Abdominal: Soft. Bowel sounds are normal.  Neurological: She is alert and oriented to person, place, and time.  Skin: Skin is warm and dry.  Patient is status post left lumpectomy and sentinel lymph node biopsy with a well-healed surgical scar.  There is some evidence of radiation dermatitis  CMP Latest Ref Rng & Units 02/13/2018  Glucose 70 - 99 mg/dL 100(H)  BUN 8 - 23 mg/dL 10  Creatinine 0.44 - 1.00 mg/dL 0.57  Sodium 135 - 145 mmol/L 133(L)  Potassium 3.5 - 5.1 mmol/L 3.8  Chloride 98 - 111 mmol/L 95(L)  CO2 22 - 32 mmol/L 29  Calcium 8.9 - 10.3 mg/dL 9.5  Total Protein 6.5 - 8.1 g/dL 7.6  Total Bilirubin 0.3 - 1.2 mg/dL 0.7  Alkaline Phos 38 - 126 U/L 97  AST 15 - 41 U/L 40  ALT 0 - 44 U/L 40   CBC Latest Ref Rng & Units 02/13/2018  WBC 4.0 - 10.5 K/uL 4.4  Hemoglobin 12.0 - 15.0 g/dL 12.2  Hematocrit 36.0 - 46.0 % 36.0  Platelets 150 - 400 K/uL 282     Assessment and plan- Patient is a 72 y.o. 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  She is here to discuss adjuvant hormone therapy  I again discussed the results of the pathology with the patient in detail.  She is now status post surgery and has completed adjuvant radiation therapy as well.Given that her tumor was ER PR positive hormone therapy is indicated at this time.  Idiscussed the role for hormone therapy. Given that she is postmenopausal I would favor 5 years of adjuvant hormone therapy with aromatase inhibitor. I discussed the risks and benefits of Arimidex including all but not limited to fatigue, hypercholesterolemia, hot flashes, arthralgias and worsening bone health.  Patient will also need to be on calcium 1200 mg along with vitamin D 800 international units.  Written information about Arimidex given to the patient. I  would like her to finish radiation therapy and start hormone therapy thereafter. Patient verbalized understanding and agrees to proceed.  Treatment will be given with a curative intent  I will see her back in  6 weeks time to assess how she is doing on Arimidex and check a CMP at that time.  Her baseline bone density scan showed some osteopenia which we will continue to monitor      Visit Diagnosis 1. Invasive carcinoma of breast ([place])   2. Goals of care, counseling/discussion      Dr. Randa Evens, MD, MPH [facility] [phone] 02/16/2018 9:48 AM

## 2018-02-17 ENCOUNTER — Ambulatory Visit: Payer: PPO

## 2018-02-18 ENCOUNTER — Ambulatory Visit: Payer: PPO

## 2018-02-23 ENCOUNTER — Ambulatory Visit: Payer: PPO

## 2018-02-24 ENCOUNTER — Ambulatory Visit: Payer: PPO

## 2018-02-24 DIAGNOSIS — Z8739 Personal history of other diseases of the musculoskeletal system and connective tissue: Secondary | ICD-10-CM | POA: Diagnosis not present

## 2018-02-24 DIAGNOSIS — E7849 Other hyperlipidemia: Secondary | ICD-10-CM | POA: Diagnosis not present

## 2018-02-24 DIAGNOSIS — E034 Atrophy of thyroid (acquired): Secondary | ICD-10-CM | POA: Diagnosis not present

## 2018-02-24 DIAGNOSIS — I10 Essential (primary) hypertension: Secondary | ICD-10-CM | POA: Diagnosis not present

## 2018-02-24 DIAGNOSIS — R1013 Epigastric pain: Secondary | ICD-10-CM | POA: Diagnosis not present

## 2018-02-25 ENCOUNTER — Ambulatory Visit: Payer: PPO

## 2018-03-03 DIAGNOSIS — I10 Essential (primary) hypertension: Secondary | ICD-10-CM | POA: Diagnosis not present

## 2018-03-03 DIAGNOSIS — E559 Vitamin D deficiency, unspecified: Secondary | ICD-10-CM | POA: Diagnosis not present

## 2018-03-03 DIAGNOSIS — Z170 Estrogen receptor positive status [ER+]: Secondary | ICD-10-CM | POA: Diagnosis not present

## 2018-03-03 DIAGNOSIS — K760 Fatty (change of) liver, not elsewhere classified: Secondary | ICD-10-CM | POA: Diagnosis not present

## 2018-03-03 DIAGNOSIS — E7849 Other hyperlipidemia: Secondary | ICD-10-CM | POA: Diagnosis not present

## 2018-03-03 DIAGNOSIS — F3342 Major depressive disorder, recurrent, in full remission: Secondary | ICD-10-CM | POA: Diagnosis not present

## 2018-03-03 DIAGNOSIS — C50412 Malignant neoplasm of upper-outer quadrant of left female breast: Secondary | ICD-10-CM | POA: Diagnosis not present

## 2018-03-03 DIAGNOSIS — E034 Atrophy of thyroid (acquired): Secondary | ICD-10-CM | POA: Diagnosis not present

## 2018-03-13 ENCOUNTER — Ambulatory Visit: Payer: PPO | Admitting: Radiation Oncology

## 2018-03-19 ENCOUNTER — Encounter: Payer: Self-pay | Admitting: Radiation Oncology

## 2018-03-19 ENCOUNTER — Other Ambulatory Visit: Payer: Self-pay

## 2018-03-19 ENCOUNTER — Ambulatory Visit
Admission: RE | Admit: 2018-03-19 | Discharge: 2018-03-19 | Disposition: A | Discharge status: Home / Self Care | Payer: PPO | Source: Ambulatory Visit | Attending: Radiation Oncology | Admitting: Radiation Oncology

## 2018-03-19 VITALS — BP 147/85 | HR 92 | Temp 97.5°F | Resp 18 | Wt 140.0 lb

## 2018-03-19 DIAGNOSIS — R251 Tremor, unspecified: Secondary | ICD-10-CM | POA: Insufficient documentation

## 2018-03-19 DIAGNOSIS — Z170 Estrogen receptor positive status [ER+]: Secondary | ICD-10-CM | POA: Diagnosis not present

## 2018-03-19 DIAGNOSIS — C50412 Malignant neoplasm of upper-outer quadrant of left female breast: Secondary | ICD-10-CM | POA: Insufficient documentation

## 2018-03-19 DIAGNOSIS — Z923 Personal history of irradiation: Secondary | ICD-10-CM | POA: Diagnosis not present

## 2018-03-19 DIAGNOSIS — R110 Nausea: Secondary | ICD-10-CM | POA: Insufficient documentation

## 2018-03-19 DIAGNOSIS — C50919 Malignant neoplasm of unspecified site of unspecified female breast: Secondary | ICD-10-CM

## 2018-03-19 DIAGNOSIS — Z79811 Long term (current) use of aromatase inhibitors: Secondary | ICD-10-CM | POA: Insufficient documentation

## 2018-03-19 NOTE — Progress Notes (Signed)
Radiation Oncology Follow up Note  Name: Christy Braun   Date:   03/19/2018 MRN:  [id number] DOB: 1945-04-15    This 72 y.o. female presents to the clinic today for one-month follow-up status post whole breast radiation to her left breast for ER/PR positive stage I invasive mammary carcinoma.  REFERRING PROVIDER: Adin Hector, MD  HPI: patient is a 72 year old female now out 1 month having completed whole breast radiation to her left breast for ER/PR positive stage I (T1 BN 0 M0) invasive mammary carcinoma. Seen today in routine follow-up she is doing well. She specifically denies breast tenderness cough or bone pain..she's been started on aromatase was is causing her to have some tremor as well as occasional nausea. She will address those issues with medical oncology.  COMPLICATIONS OF TREATMENT: none  FOLLOW UP COMPLIANCE: keeps appointments   PHYSICAL EXAM:  BP (!) 147/85 (BP Location: Left Arm, Patient Position: Sitting)   Pulse 92   Temp (!) 97.5 F (36.4 C) (Tympanic)   Resp 18   Wt 140 lb (63.5 kg)   BMI 23.30 kg/m  Lungs are clear to A&P cardiac examination essentially unremarkable with regular rate and rhythm. No dominant mass or nodularity is noted in either breast in 2 positions examined. Incision is well-healed. No axillary or supraclavicular adenopathy is appreciated. Cosmetic result is excellent.Well-developed well-nourished patient in NAD. HEENT reveals PERLA, EOMI, discs not visualized.  Oral cavity is clear. No oral mucosal lesions are identified. Neck is clear without evidence of cervical or supraclavicular adenopathy. Lungs are clear to A&P. Cardiac examination is essentially unremarkable with regular rate and rhythm without murmur rub or thrill. Abdomen is benign with no organomegaly or masses noted. Motor sensory and DTR levels are equal and symmetric in the upper and lower extremities. Cranial nerves II through XII are grossly intact. Proprioception is intact.  No peripheral adenopathy or edema is identified. No motor or sensory levels are noted. Crude visual fields are within normal range.  RADIOLOGY RESULTS: no current films for review  PLAN: present time patient is doing well 1 month out from whole breast radiation and please were overall progress. I've asked her to discuss the room and X with her medical oncologist. I've otherwise asked to see her back in 4-5 months for follow-up. Patient knows to call with any concerns.  I would like to take this opportunity to thank you for allowing me to participate in the care of your patient.Noreene Filbert, MD

## 2018-03-23 ENCOUNTER — Telehealth: Payer: Self-pay | Admitting: *Deleted

## 2018-03-23 NOTE — Telephone Encounter (Signed)
Called patient to discuss. Voicemail left to return call.

## 2018-03-23 NOTE — Telephone Encounter (Signed)
Patient called and states she cannot tolerate the cancer pill (anastrozole) and would like something else ordered. Please advise

## 2018-03-24 NOTE — Telephone Encounter (Signed)
Patient experienced nausea and 'shakes' that were worsening. She stopped medication approximately 5 days ago. She is scheduled to see Dr. Janese Banks in couple weeks for follow up. Advised her to continue to hold anastrozole and she could discuss her symptoms with Dr. Janese Banks at her next appointment and Dr. Janese Banks may consider starting her on Aromasin at that time. Patient thanked for call and information and I advised her to call clinic if concerns.

## 2018-03-24 NOTE — Telephone Encounter (Signed)
Patient called back again today asking what we are going to do about her cancer pill. Please return call [phone]

## 2018-03-30 ENCOUNTER — Other Ambulatory Visit: Payer: Self-pay

## 2018-03-30 ENCOUNTER — Inpatient Hospital Stay: Payer: PPO

## 2018-03-30 ENCOUNTER — Inpatient Hospital Stay: Payer: PPO | Attending: Oncology | Admitting: Oncology

## 2018-03-30 ENCOUNTER — Encounter: Payer: Self-pay | Admitting: Oncology

## 2018-03-30 VITALS — BP 118/70 | HR 93 | Temp 98.4°F | Resp 18 | Wt 145.4 lb

## 2018-03-30 DIAGNOSIS — E039 Hypothyroidism, unspecified: Secondary | ICD-10-CM | POA: Diagnosis not present

## 2018-03-30 DIAGNOSIS — C50919 Malignant neoplasm of unspecified site of unspecified female breast: Secondary | ICD-10-CM

## 2018-03-30 DIAGNOSIS — C50412 Malignant neoplasm of upper-outer quadrant of left female breast: Secondary | ICD-10-CM | POA: Diagnosis not present

## 2018-03-30 DIAGNOSIS — Z79811 Long term (current) use of aromatase inhibitors: Secondary | ICD-10-CM | POA: Diagnosis not present

## 2018-03-30 DIAGNOSIS — Z87891 Personal history of nicotine dependence: Secondary | ICD-10-CM | POA: Insufficient documentation

## 2018-03-30 DIAGNOSIS — Z170 Estrogen receptor positive status [ER+]: Secondary | ICD-10-CM | POA: Diagnosis not present

## 2018-03-30 DIAGNOSIS — Z79899 Other long term (current) drug therapy: Secondary | ICD-10-CM | POA: Insufficient documentation

## 2018-03-30 DIAGNOSIS — I10 Essential (primary) hypertension: Secondary | ICD-10-CM

## 2018-03-30 LAB — COMPREHENSIVE METABOLIC PANEL WITH GFR
ALT: 36 U/L (ref 0–44)
AST: 37 U/L (ref 15–41)
Albumin: 4 g/dL (ref 3.5–5.0)
Alkaline Phosphatase: 115 U/L (ref 38–126)
Anion gap: 10 (ref 5–15)
BUN: 17 mg/dL (ref 8–23)
CO2: 28 mmol/L (ref 22–32)
Calcium: 8.9 mg/dL (ref 8.9–10.3)
Chloride: 101 mmol/L (ref 98–111)
Creatinine, Ser: 0.62 mg/dL (ref 0.44–1.00)
GFR calc Af Amer: >60 mL/min (ref >60)
GFR calc non Af Amer: >60 mL/min (ref >60)
Glucose, Bld: 115 mg/dL — ABNORMAL HIGH (ref 70–99)
Potassium: 3.4 mmol/L — ABNORMAL LOW (ref 3.5–5.1)
Sodium: 139 mmol/L (ref 135–145)
Total Bilirubin: 0.5 mg/dL (ref 0.3–1.2)
Total Protein: 7.2 g/dL (ref 6.5–8.1)

## 2018-03-30 MED ORDER — EXEMESTANE 25 MG PO TABS: 25.0000 mg | ORAL_TABLET | Freq: Every day | ORAL | 3 refills | Status: DC

## 2018-03-30 NOTE — Progress Notes (Signed)
Here for follow up. Overall stated doing well except intermittent nausea since radiation tx s  Phenergan helps

## 2018-03-30 NOTE — Progress Notes (Signed)
Hematology/Oncology Consult note [facility]  Telephone:([phone] [phone]  Patient Care Team: Adin Hector, MD as PCP - General (Internal Medicine)   Name of the patient: Christy Braun  [id number]  1945-11-03   Date of visit: 03/30/18  Diagnosis- pathologic prognostic stage IapT1b pN0 M0invasive mucinous carcinoma of the left breast ER PR positive HER-2/neu negative    Chief complaint/ Reason for visit- assess tolerance to arimidex  Heme/Onc history: patient is a 73 year old female who underwent Bilateral screening mammogram in August 2019 which showed a possible mass in the left breast. Diagnostic mammogram and ultrasound of the left breast showed 3 x 4 x 3 mm hypoechoic mass at the 2 o'clock position. No pathologic left axillary adenopathy. 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 Patient has seen Dr. [name] and will be undergoing lumpectomy soon. No prior history of breast cancer or breast biopsies. Menarche at the age of 53. Menopause was close to 10 years ago. She is G2, P2. She did not breast-feed. No family history of breast, colon, pancreatic cancer.States that her mother had some cancer and she is not sure if it was ovarian cancer  Patient had a grade 1, 7 mm invasive mucinous carcinoma with negative margins. Sentinel lymph node biopsy was negative for malignancy. Patient did not need Oncotype testing at this time given that her tumor was less than 10 mm and grade 1. She completed adjuvant RT   Interval history-patient started taking Arimidex in December 2019 but stopped taking it after 3 to 4 weeks when she developed nausea and tremors which were unrelenting.  She stopped it about 4 days ago and reports that her symptoms are much better at this time  ECOG PS- 1 Pain scale- 0 Opioid associated  constipation- no  Review of systems- Review of Systems  Constitutional: Negative for chills, fever, malaise/fatigue and weight loss.  HENT: Negative for congestion, ear discharge and nosebleeds.   Eyes: Negative for blurred vision.  Respiratory: Negative for cough, hemoptysis, sputum production, shortness of breath and wheezing.   Cardiovascular: Negative for chest pain, palpitations, orthopnea and claudication.  Gastrointestinal: Negative for abdominal pain, blood in stool, constipation, diarrhea, heartburn, melena, nausea and vomiting.  Genitourinary: Negative for dysuria, flank pain, frequency, hematuria and urgency.  Musculoskeletal: Negative for back pain, joint pain and myalgias.  Skin: Negative for rash.  Neurological: Negative for dizziness, tingling, focal weakness, seizures, weakness and headaches.  Endo/Heme/Allergies: Does not bruise/bleed easily.  Psychiatric/Behavioral: Negative for depression and suicidal ideas. The patient does not have insomnia.        Allergies  Allergen Reactions  . Penicillins Hives, Swelling and Other (See Comments)    "knots" on legs Has patient had a PCN reaction causing immediate rash, facial/tongue/throat swelling, SOB or lightheadedness with hypotension: No Has patient had a PCN reaction causing severe rash involving mucus membranes or skin necrosis: No Has patient had a PCN reaction that required hospitalization: No Has patient had a PCN reaction occurring within the last 10 years: No If all of the above answers are "NO", then may proceed with Cephalosporin use.    [name] Oxycodone-Acetaminophen Anxiety and Other (See Comments)    confusion     Past Medical History:  Diagnosis Date  . Bone cancer ([place])   . Complication of anesthesia   . Hyperlipidemia   . Hypertension   . Hypothyroidism   .  PONV (postoperative nausea and vomiting)      Past Surgical History:  Procedure Laterality Date  . BACK SURGERY     lumbar ruptured disc  .  BREAST BIOPSY Left 9/5/20019   u/s core bc path pending  . BREAST LUMPECTOMY Left 12/09/2017   NL with SN  . FOOT SURGERY Bilateral    x 3 bunionectomy  . PARTIAL MASTECTOMY WITH NEEDLE LOCALIZATION AND AXILLARY SENTINEL LYMPH NODE BX Left 12/09/2017   Procedure: PARTIAL MASTECTOMY WITH NEEDLE LOCALIZATION AND AXILLARY SENTINEL LYMPH NODE BX;  Surgeon: Benjamine Sprague, DO;  Location: [facility] ORS;  Service: General;  Laterality: Left;    Social History   Socioeconomic History  . Marital status: Married    Spouse name: Not on file  . Number of children: Not on file  . Years of education: Not on file  . Highest education level: Not on file  Occupational History  . Not on file  Social Needs  . Financial resource strain: Not on file  . Food insecurity:    Worry: Not on file    Inability: Not on file  . Transportation needs:    Medical: Not on file    Non-medical: Not on file  Tobacco Use  . Smoking status: Former Smoker    Types: Cigarettes    Last attempt to quit: 12/08/1968    Years since quitting: 49.3  . Smokeless tobacco: Never Used  . Tobacco comment: quit smoking over 50 yrs ago  Substance and Sexual Activity  . Alcohol use: Yes    Comment: rare  . Drug use: Never  . Sexual activity: Not on file  Lifestyle  . Physical activity:    Days per week: Not on file    Minutes per session: Not on file  . Stress: Not on file  Relationships  . Social connections:    Talks on phone: Not on file    Gets together: Not on file    Attends religious service: Not on file    Active member of club or organization: Not on file    Attends meetings of clubs or organizations: Not on file    Relationship status: Not on file  . Intimate partner violence:    Fear of current or ex partner: Not on file    Emotionally abused: Not on file    Physically abused: Not on file    Forced sexual activity: Not on file  Other Topics Concern  . Not on file  Social History Narrative  . Not on file     Family History  Problem Relation Age of Onset  . Ovarian cancer Mother   . Heart disease Father   . Diabetes Father   . Breast cancer Neg Hx      Current Outpatient Medications:  .  anastrozole (ARIMIDEX) 1 MG tablet, Take 1 tablet (1 mg total) by mouth daily., Disp: 30 tablet, Rfl: 1 .  atorvastatin (LIPITOR) 80 MG tablet, Take 80 mg by mouth daily., Disp: , Rfl:  .  Cholecalciferol (VITAMIN D3) 1000 units CAPS, Take 1,000 Units by mouth 2 (two) times daily., Disp: , Rfl:  .  citalopram (CELEXA) 20 MG tablet, Take 20 mg by mouth daily., Disp: , Rfl:  .  latanoprost (XALATAN) 0.005 % ophthalmic solution, Place 1 drop into both eyes at bedtime., Disp: , Rfl:  .  Multiple Vitamin (MULTIVITAMIN WITH MINERALS) TABS tablet, Take 1 tablet by mouth daily., Disp: , Rfl:  .  promethazine (PHENERGAN) 25 MG  tablet, Take 1 tablet (25 mg total) by mouth every 6 (six) hours as needed for nausea or vomiting., Disp: 40 tablet, Rfl: 1 .  ibuprofen (ADVIL,MOTRIN) 800 MG tablet, Take 1 tablet (800 mg total) by mouth every 8 (eight) hours as needed for mild pain or moderate pain. (Patient not taking: Reported on 03/30/2018), Disp: 30 tablet, Rfl: 0 .  lansoprazole (PREVACID) 30 MG capsule, Take 1 capsule (30 mg total) by mouth daily at 12 noon. (Patient not taking: Reported on 03/30/2018), Disp: 30 capsule, Rfl: 2 .  levothyroxine (SYNTHROID, LEVOTHROID) 50 MCG tablet, Take 50 mcg by mouth daily before breakfast., Disp: , Rfl:  .  losartan-hydrochlorothiazide (HYZAAR) 50-12.5 MG tablet, Take 1 tablet by mouth daily., Disp: , Rfl:   Physical exam:  Vitals:   03/30/18 1125  BP: 118/70  Pulse: 93  Resp: 18  Temp: 98.4 F (36.9 C)  TempSrc: Tympanic  Weight: 145 lb 6.4 oz (66 kg)   Physical Exam Constitutional:      General: She is not in acute distress. HENT:     Head: Normocephalic and atraumatic.  Eyes:     Pupils: Pupils are equal, round, and reactive to light.  Neck:     Musculoskeletal:  Normal range of motion.  Cardiovascular:     Rate and Rhythm: Normal rate and regular rhythm.     Heart sounds: Normal heart sounds.  Pulmonary:     Effort: Pulmonary effort is normal.     Breath sounds: Normal breath sounds.  Abdominal:     General: Bowel sounds are normal.     Palpations: Abdomen is soft.  Skin:    General: Skin is warm and dry.  Neurological:     Mental Status: She is alert and oriented to person, place, and time.      CMP Latest Ref Rng & Units 03/30/2018  Glucose 70 - 99 mg/dL 115(H)  BUN 8 - 23 mg/dL 17  Creatinine 0.44 - 1.00 mg/dL 0.62  Sodium 135 - 145 mmol/L 139  Potassium 3.5 - 5.1 mmol/L 3.4(L)  Chloride 98 - 111 mmol/L 101  CO2 22 - 32 mmol/L 28  Calcium 8.9 - 10.3 mg/dL 8.9  Total Protein 6.5 - 8.1 g/dL 7.2  Total Bilirubin 0.3 - 1.2 mg/dL 0.5  Alkaline Phos 38 - 126 U/L 115  AST 15 - 41 U/L 37  ALT 0 - 44 U/L 36   CBC Latest Ref Rng & Units 02/13/2018  WBC 4.0 - 10.5 K/uL 4.4  Hemoglobin 12.0 - 15.0 g/dL 12.2  Hematocrit 36.0 - 46.0 % 36.0  Platelets 150 - 400 K/uL 282     Assessment and plan- Patient is a 73 y.o. female pathological prognostic stage IApT1b pN0 cM0 invasive mucinous carcinoma of the left breast ER PR positive HER-2/neu negative status post lumpectomy.  She is here to discuss alternative hormone therapy options  Patient discontinued her Arimidex about 3 to 4 days ago after she experienced nausea and tremors with Arimidex.  I discussed with her that we could try alternative such as Aromasin or letrozole at this time and if none of these are tolerated we could consider switching her to tamoxifen.  Patient is agreeable to starting Aromasin which I will send to her pharmacy today.  I will see her back in 2 months to see how she is doing and check a CMP at that time.  She will call us sooner if she has any worsening side effects with Aromasin.  Visit Diagnosis 1. Invasive carcinoma of breast ([place])   2. Use of anastrozole  (Arimidex)      Dr. Randa Evens, MD, MPH [facility] [phone] 03/30/2018 3:23 PM

## 2018-03-31 DIAGNOSIS — H40153 Residual stage of open-angle glaucoma, bilateral: Secondary | ICD-10-CM | POA: Diagnosis not present

## 2018-04-06 DIAGNOSIS — C50412 Malignant neoplasm of upper-outer quadrant of left female breast: Secondary | ICD-10-CM | POA: Diagnosis not present

## 2018-04-06 DIAGNOSIS — Z170 Estrogen receptor positive status [ER+]: Secondary | ICD-10-CM | POA: Diagnosis not present

## 2018-04-07 ENCOUNTER — Other Ambulatory Visit: Payer: Self-pay | Admitting: Radiation Oncology

## 2018-05-29 ENCOUNTER — Other Ambulatory Visit: Payer: Self-pay

## 2018-05-29 ENCOUNTER — Inpatient Hospital Stay: Payer: PPO | Attending: Oncology | Admitting: Oncology

## 2018-05-29 ENCOUNTER — Inpatient Hospital Stay: Payer: PPO

## 2018-05-29 ENCOUNTER — Encounter: Payer: Self-pay | Admitting: Oncology

## 2018-05-29 VITALS — BP 128/73 | HR 105 | Temp 99.0°F | Resp 16 | Ht 65.0 in

## 2018-05-29 DIAGNOSIS — Z170 Estrogen receptor positive status [ER+]: Secondary | ICD-10-CM | POA: Insufficient documentation

## 2018-05-29 DIAGNOSIS — E039 Hypothyroidism, unspecified: Secondary | ICD-10-CM

## 2018-05-29 DIAGNOSIS — C50919 Malignant neoplasm of unspecified site of unspecified female breast: Secondary | ICD-10-CM

## 2018-05-29 DIAGNOSIS — Z87891 Personal history of nicotine dependence: Secondary | ICD-10-CM | POA: Diagnosis not present

## 2018-05-29 DIAGNOSIS — I10 Essential (primary) hypertension: Secondary | ICD-10-CM

## 2018-05-29 DIAGNOSIS — C50412 Malignant neoplasm of upper-outer quadrant of left female breast: Secondary | ICD-10-CM

## 2018-05-29 DIAGNOSIS — Z79811 Long term (current) use of aromatase inhibitors: Secondary | ICD-10-CM | POA: Diagnosis not present

## 2018-05-29 LAB — COMPREHENSIVE METABOLIC PANEL
ALT: 37 U/L (ref 0–44)
AST: 36 U/L (ref 15–41)
Albumin: 4.4 g/dL (ref 3.5–5.0)
Alkaline Phosphatase: 102 U/L (ref 38–126)
Anion gap: 10 (ref 5–15)
BILIRUBIN TOTAL: 0.6 mg/dL (ref 0.3–1.2)
BUN: 16 mg/dL (ref 8–23)
CO2: 26 mmol/L (ref 22–32)
CREATININE: 0.68 mg/dL (ref 0.44–1.00)
Calcium: 9 mg/dL (ref 8.9–10.3)
Chloride: 99 mmol/L (ref 98–111)
Glucose, Bld: 105 mg/dL — ABNORMAL HIGH (ref 70–99)
POTASSIUM: 3.6 mmol/L (ref 3.5–5.1)
Sodium: 135 mmol/L (ref 135–145)
TOTAL PROTEIN: 8.1 g/dL (ref 6.5–8.1)

## 2018-05-29 NOTE — Progress Notes (Signed)
Patient here for follow up. No changes and no complaints today.

## 2018-06-01 NOTE — Progress Notes (Signed)
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 Diagnostic mammogram and ultrasound of the left breast showed 3 x 4 x 3 mm hypoechoic mass at the 2 o'clock position. No pathologic left axillary adenopathy. 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 Patient has seen Dr. [name] and will be undergoing lumpectomy soon. No prior history of breast cancer or breast biopsies. Menarche at the age of 72. Menopause was close to 10 years ago. She is G2, P2. She did not breast-feed. No family history of breast, colon, pancreatic cancer.States that her mother had some cancer and she is not sure if it was ovarian cancer  Patient had a grade 1,7 mm invasive mucinous carcinoma with negative margins. Sentinel lymph node biopsy was negative for malignancy. Patient didnot need Oncotype testing at this time given that her tumor was less than 10 mm and grade 1. She completed adjuvant RT.[name]  Patient was started on Arimidex in December 2019.  She could not tolerate it due to significant nausea and tremors and was switched to Aromasin  Interval history-patient reports tolerating Aromasin well without any significant nausea or tremors, fatigue or arthralgias.  ECOG PS- 0 Pain scale-  0   Review of systems- Review of Systems  Constitutional: Negative for chills, fever, malaise/fatigue and weight loss.  HENT: Negative for congestion, ear discharge and nosebleeds.   Eyes: Negative for blurred vision.  Respiratory: Negative for cough, hemoptysis, sputum production, shortness of breath and wheezing.   Cardiovascular: Negative for chest pain, palpitations, orthopnea and claudication.  Gastrointestinal: Negative for abdominal pain, blood in stool, constipation, diarrhea, heartburn, melena, nausea and vomiting.  Genitourinary: Negative for dysuria, flank pain, frequency, hematuria and urgency.  Musculoskeletal: Negative for back pain, joint pain and myalgias.  Skin: Negative for rash.  Neurological: Negative for dizziness, tingling, focal weakness, seizures, weakness and headaches.  Endo/Heme/Allergies: Does not bruise/bleed easily.  Psychiatric/Behavioral: Negative for depression and suicidal ideas. The patient does not have insomnia.        Allergies  Allergen Reactions  . Penicillins Hives, Swelling and Other (See Comments)    "knots" on legs Has patient had a PCN reaction causing immediate rash, facial/tongue/throat swelling, SOB or lightheadedness with hypotension: No Has patient had a PCN reaction causing severe rash involving mucus membranes or skin necrosis: No Has patient had a PCN reaction that required hospitalization: No Has patient had a PCN reaction occurring within the last 10 years: No If all of the above answers are "NO", then may proceed with Cephalosporin use.    [name] Oxycodone-Acetaminophen Anxiety and Other (See Comments)    confusion     Past Medical History:  Diagnosis Date  . Bone cancer ([place])   . Complication of anesthesia   . Hyperlipidemia   . Hypertension   . Hypothyroidism   . PONV (postoperative nausea and vomiting)  Past Surgical History:  Procedure Laterality Date  . BACK SURGERY     lumbar ruptured disc  . BREAST BIOPSY  Left 9/5/20019   u/s core bc path pending  . BREAST LUMPECTOMY Left 12/09/2017   NL with SN  . FOOT SURGERY Bilateral    x 3 bunionectomy  . PARTIAL MASTECTOMY WITH NEEDLE LOCALIZATION AND AXILLARY SENTINEL LYMPH NODE BX Left 12/09/2017   Procedure: PARTIAL MASTECTOMY WITH NEEDLE LOCALIZATION AND AXILLARY SENTINEL LYMPH NODE BX;  Surgeon: Benjamine Sprague, DO;  Location: [facility] ORS;  Service: General;  Laterality: Left;    Social History   Socioeconomic History  . Marital status: Married    Spouse name: Not on file  . Number of children: Not on file  . Years of education: Not on file  . Highest education level: Not on file  Occupational History  . Not on file  Social Needs  . Financial resource strain: Not on file  . Food insecurity:    Worry: Not on file    Inability: Not on file  . Transportation needs:    Medical: Not on file    Non-medical: Not on file  Tobacco Use  . Smoking status: Former Smoker    Types: Cigarettes    Last attempt to quit: 12/08/1968    Years since quitting: 49.5  . Smokeless tobacco: Never Used  . Tobacco comment: quit smoking over 50 yrs ago  Substance and Sexual Activity  . Alcohol use: Yes    Comment: rare  . Drug use: Never  . Sexual activity: Not on file  Lifestyle  . Physical activity:    Days per week: Not on file    Minutes per session: Not on file  . Stress: Not on file  Relationships  . Social connections:    Talks on phone: Not on file    Gets together: Not on file    Attends religious service: Not on file    Active member of club or organization: Not on file    Attends meetings of clubs or organizations: Not on file    Relationship status: Not on file  . Intimate partner violence:    Fear of current or ex partner: Not on file    Emotionally abused: Not on file    Physically abused: Not on file    Forced sexual activity: Not on file  Other Topics Concern  . Not on file  Social History Narrative  . Not on file    Family  History  Problem Relation Age of Onset  . Ovarian cancer Mother   . Heart disease Father   . Diabetes Father   . Breast cancer Neg Hx      Current Outpatient Medications:  .  atorvastatin (LIPITOR) 80 MG tablet, Take 80 mg by mouth daily., Disp: , Rfl:  .  Cholecalciferol (VITAMIN D3) 1000 units CAPS, Take 1,000 Units by mouth 2 (two) times daily., Disp: , Rfl:  .  citalopram (CELEXA) 20 MG tablet, Take 20 mg by mouth daily., Disp: , Rfl:  .  ibuprofen (ADVIL,MOTRIN) 800 MG tablet, Take 1 tablet (800 mg total) by mouth every 8 (eight) hours as needed for mild pain or moderate pain., Disp: 30 tablet, Rfl: 0 .  lansoprazole (PREVACID) 30 MG capsule, Take 1 capsule (30 mg total) by mouth daily at 12 noon., Disp: 30 capsule, Rfl: 2 .  latanoprost (XALATAN) 0.005 % ophthalmic solution, Place 1 drop into both eyes at bedtime., Disp: , Rfl:  .  levothyroxine (SYNTHROID, LEVOTHROID) 50 MCG tablet, Take 50 mcg by mouth daily before breakfast., Disp: , Rfl:  .  losartan-hydrochlorothiazide (HYZAAR) 50-12.5 MG tablet, Take 1 tablet by mouth daily., Disp: , Rfl:  .  Multiple Vitamin (MULTIVITAMIN WITH MINERALS) TABS tablet, Take 1 tablet by mouth daily., Disp: , Rfl:  .  promethazine (PHENERGAN) 25 MG tablet, Take 1 tablet (25 mg total) by mouth every 6 (six) hours as needed for nausea or vomiting., Disp: 40 tablet, Rfl: 1 .  exemestane (AROMASIN) 25 MG tablet, Take 1 tablet (25 mg total) by mouth daily after breakfast., Disp: 30 tablet, Rfl: 3  Physical exam:  Vitals:   05/29/18 1352 05/29/18 1358  BP:  128/73  Pulse:  (!) 105  Resp: 16   Temp:  99 F (37.2 C)  TempSrc:  Tympanic  Height: 5' 5"  (1.651 m)    Physical Exam Constitutional:      General: She is not in acute distress. HENT:     Head: Normocephalic and atraumatic.  Eyes:     Pupils: Pupils are equal, round, and reactive to light.  Neck:     Musculoskeletal: Normal range of motion.  Cardiovascular:     Rate and Rhythm:  Normal rate and regular rhythm.     Heart sounds: Normal heart sounds.  Pulmonary:     Effort: Pulmonary effort is normal.     Breath sounds: Normal breath sounds.  Abdominal:     General: Bowel sounds are normal.     Palpations: Abdomen is soft.  Skin:    General: Skin is warm and dry.  Neurological:     Mental Status: She is alert and oriented to person, place, and time.      CMP Latest Ref Rng & Units 05/29/2018  Glucose 70 - 99 mg/dL 105(H)  BUN 8 - 23 mg/dL 16  Creatinine 0.44 - 1.00 mg/dL 0.68  Sodium 135 - 145 mmol/L 135  Potassium 3.5 - 5.1 mmol/L 3.6  Chloride 98 - 111 mmol/L 99  CO2 22 - 32 mmol/L 26  Calcium 8.9 - 10.3 mg/dL 9.0  Total Protein 6.5 - 8.1 g/dL 8.1  Total Bilirubin 0.3 - 1.2 mg/dL 0.6  Alkaline Phos 38 - 126 U/L 102  AST 15 - 41 U/L 36  ALT 0 - 44 U/L 37   CBC Latest Ref Rng & Units 02/13/2018  WBC 4.0 - 10.5 K/uL 4.4  Hemoglobin 12.0 - 15.0 g/dL 12.2  Hematocrit 36.0 - 46.0 % 36.0  Platelets 150 - 400 K/uL 282      Assessment and plan- Patient is a 73 y.o. female pathological prognostic stage IApT1b pN0 cM0 invasive mucinous carcinoma of the left breast ER PR positive HER-2/neu negative status post lumpectomy she is here for routine follow-up of breast cancer on Aromasin  After patient was switched from Arimidex to Aromasin she tolerated it well and did not have any significant side effects.  She will therefore continue Aromasin for 5 years.  We will obtain the results of the bone density scan that she had done in June 2019.  I will see her back in 4 months no labs   Visit Diagnosis 1. Use of exemestane (Aromasin)      Dr. Randa Evens, MD, MPH [facility] [phone] 06/01/2018 11:09 AM

## 2018-06-22 ENCOUNTER — Telehealth: Payer: Self-pay | Admitting: *Deleted

## 2018-06-22 ENCOUNTER — Other Ambulatory Visit: Payer: Self-pay | Admitting: Oncology

## 2018-06-22 MED ORDER — LETROZOLE 2.5 MG PO TABS: 2.5000 mg | ORAL_TABLET | Freq: Every day | ORAL | 2 refills | Status: DC

## 2018-06-22 NOTE — Telephone Encounter (Signed)
Patient called reporting that she was started on a new medication and that it is making her sick and she is shaky and trembling too. She asks what she is to do or if the medicine can be changed. Please advise

## 2018-06-22 NOTE — Telephone Encounter (Signed)
Call returned to patient and advised to stop Aromasin and start Letrozole in 2 weeks

## 2018-06-22 NOTE — Telephone Encounter (Signed)
Stop aromasin for 2 weeks. We will send prescription for letrozole which she can start taking after 2 weeks

## 2018-06-26 ENCOUNTER — Telehealth: Payer: Self-pay | Admitting: *Deleted

## 2018-06-26 MED ORDER — ONDANSETRON HCL 4 MG PO TABS: 4.0000 mg | ORAL_TABLET | Freq: Three times a day (TID) | ORAL | 0 refills | Status: DC | PRN

## 2018-06-26 NOTE — Telephone Encounter (Signed)
Patient called reporting that she is very nauseated and has no medicine for it. Please advise. She is off her AI at this time.

## 2018-06-26 NOTE — Telephone Encounter (Signed)
Can you give her prn zofran

## 2018-07-09 ENCOUNTER — Telehealth: Payer: Self-pay | Admitting: *Deleted

## 2018-07-09 NOTE — Telephone Encounter (Signed)
Patient called reporting that her new medicine is also making her nauseated and she is gagging even using nausea medicine. Please advise

## 2018-07-09 NOTE — Telephone Encounter (Signed)
Call returned to patient and advised of doctor response and that scheduler will call her with appointment for Web ex. She repeated back to me and took medicine out of her pill boxes while on the phone. She discovered that she was taking letrozole and exemestane both, but has now taken them out of her medicine boxes

## 2018-07-09 NOTE — Telephone Encounter (Signed)
Has she stopped her hormone medicine? If she has- what is she using for nausea?

## 2018-07-09 NOTE — Telephone Encounter (Signed)
She has zofran and phenergan and she just started Exemestane Tuesday

## 2018-07-09 NOTE — Telephone Encounter (Signed)
Ok. Then she stops exemestane for 2-3 weeks. Video visit with me after that

## 2018-07-10 ENCOUNTER — Telehealth: Payer: Self-pay | Admitting: *Deleted

## 2018-07-10 ENCOUNTER — Other Ambulatory Visit: Payer: Self-pay | Admitting: Oncology

## 2018-07-10 NOTE — Telephone Encounter (Signed)
I called back and spoke to patient she was taking exemestane as well as letrozole.  Because of her nausea I called Dr. Janese Banks and she told me patient could come off of both medicines for 2 weeks to make sure her nausea is better.  May 1 she should start back taking the letrozole only.  I have advised her to write on the bottle of exemestane do not take any longer and put it somewhere different than her daily medicines.  She said she put it in a different spot and she wrote on it not to take it anymore.  Told patient on May 1 if she is still experiencing nausea then she should call our office because if she still having the nausea obviously the problem was not the letrozole she was taking.  The problems otherwise she will start the letrozole May 1.  She wrote it in her calendar so she would not forget

## 2018-07-10 NOTE — Telephone Encounter (Signed)
Patient called to get further clarification on changes made to medications. I see note from [facility] yesterday that patient was to stop exemestane but patient states she received a call today regarding changes to medication. Are there any updates to medication adjustments from yesterday that I need to pass on to patient?

## 2018-07-15 ENCOUNTER — Telehealth: Payer: Self-pay | Admitting: *Deleted

## 2018-07-15 NOTE — Telephone Encounter (Signed)
I spoke to Dr. Janese Banks and it is unusual for patient to have nausea from AI and she was given nausea med and she said she was still having nausea. She was told to d/c letrozole for 2 weeks and she stopped in 4/16. 4/17 she called because she was unsure of the instructions of how long to stop it and on that day she felt better. I told her that when it gets to 2 weeks off 5/1 and she was still nauseated that it is likely the this pill was not causing the problems. I told her that dr. Janese Banks rec: is to see PCP. Maybe she may need GI appt. Patient says she has a hard time getting in touch with that office and I called her for her. I left message with secretary that dr. Caryl Comes could call me back and left my cell phone to see what would be best plan for patient. I had told patient on the phone that when I get a response I will call her. She was agreeable to plan

## 2018-07-15 NOTE — Telephone Encounter (Signed)
Patient s called again today reporting that she is still very nauseated and is having trouble functioning and would like something done. Please advise

## 2018-07-16 DIAGNOSIS — I10 Essential (primary) hypertension: Secondary | ICD-10-CM | POA: Diagnosis not present

## 2018-07-16 DIAGNOSIS — C50412 Malignant neoplasm of upper-outer quadrant of left female breast: Secondary | ICD-10-CM | POA: Diagnosis not present

## 2018-07-16 DIAGNOSIS — Z170 Estrogen receptor positive status [ER+]: Secondary | ICD-10-CM | POA: Diagnosis not present

## 2018-07-16 DIAGNOSIS — F3342 Major depressive disorder, recurrent, in full remission: Secondary | ICD-10-CM | POA: Diagnosis not present

## 2018-07-16 DIAGNOSIS — R509 Fever, unspecified: Secondary | ICD-10-CM | POA: Diagnosis not present

## 2018-07-16 DIAGNOSIS — R202 Paresthesia of skin: Secondary | ICD-10-CM | POA: Diagnosis not present

## 2018-07-16 DIAGNOSIS — R110 Nausea: Secondary | ICD-10-CM | POA: Diagnosis not present

## 2018-07-16 DIAGNOSIS — E034 Atrophy of thyroid (acquired): Secondary | ICD-10-CM | POA: Diagnosis not present

## 2018-07-17 ENCOUNTER — Ambulatory Visit
Admission: RE | Admit: 2018-07-17 | Discharge: 2018-07-17 | Disposition: A | Discharge status: Home / Self Care | Payer: PPO | Source: Ambulatory Visit | Attending: Internal Medicine | Admitting: Internal Medicine

## 2018-07-17 ENCOUNTER — Other Ambulatory Visit: Payer: Self-pay

## 2018-07-17 ENCOUNTER — Telehealth: Payer: Self-pay | Admitting: *Deleted

## 2018-07-17 ENCOUNTER — Other Ambulatory Visit: Payer: Self-pay | Admitting: Internal Medicine

## 2018-07-17 DIAGNOSIS — N179 Acute kidney failure, unspecified: Secondary | ICD-10-CM | POA: Diagnosis not present

## 2018-07-17 NOTE — Telephone Encounter (Signed)
Called to check up on if patient was contacted.I had not heard from office after I called last week and this week. She was seen in the office 4/23 and she is going for u/s today. I am glad she was seen and hopefully come up with what is causing the nausea

## 2018-07-20 DIAGNOSIS — N179 Acute kidney failure, unspecified: Secondary | ICD-10-CM | POA: Diagnosis not present

## 2018-07-20 NOTE — Telephone Encounter (Signed)
I have followed up with PCP Dr. Caryl Comes office and they made her appt, saw her and scheduled u/s to see what is going on making her nauseated

## 2018-07-30 ENCOUNTER — Telehealth: Payer: Self-pay | Admitting: Oncology

## 2018-07-30 ENCOUNTER — Other Ambulatory Visit: Payer: Self-pay | Admitting: Oncology

## 2018-07-31 ENCOUNTER — Other Ambulatory Visit: Payer: Self-pay

## 2018-07-31 ENCOUNTER — Inpatient Hospital Stay: Payer: PPO | Attending: Oncology | Admitting: Oncology

## 2018-07-31 ENCOUNTER — Encounter: Payer: Self-pay | Admitting: Oncology

## 2018-07-31 DIAGNOSIS — Z79811 Long term (current) use of aromatase inhibitors: Secondary | ICD-10-CM | POA: Diagnosis not present

## 2018-07-31 DIAGNOSIS — Z79899 Other long term (current) drug therapy: Secondary | ICD-10-CM | POA: Diagnosis not present

## 2018-07-31 DIAGNOSIS — C50912 Malignant neoplasm of unspecified site of left female breast: Secondary | ICD-10-CM

## 2018-07-31 DIAGNOSIS — Z87891 Personal history of nicotine dependence: Secondary | ICD-10-CM | POA: Diagnosis not present

## 2018-07-31 DIAGNOSIS — Z08 Encounter for follow-up examination after completed treatment for malignant neoplasm: Secondary | ICD-10-CM

## 2018-07-31 DIAGNOSIS — I10 Essential (primary) hypertension: Secondary | ICD-10-CM | POA: Diagnosis not present

## 2018-07-31 DIAGNOSIS — Z170 Estrogen receptor positive status [ER+]: Secondary | ICD-10-CM | POA: Diagnosis not present

## 2018-07-31 DIAGNOSIS — E039 Hypothyroidism, unspecified: Secondary | ICD-10-CM

## 2018-07-31 NOTE — Progress Notes (Signed)
No pain , no nausea. She started back on the letrozole on Tuesday of this week and doing fine. During the few weeks before this her nausea cont. Without being on AI and was sent to PCP and he saw her and did u/s and it was normal and then the nausea stopped. She felt maybe she had a bug/ virus but perfectly fine now she states

## 2018-08-02 NOTE — Progress Notes (Signed)
I connected with Christy Braun on 08/02/18 at  1:00 PM EDT by video enabled telemedicine visit and verified that I am speaking with the correct person using two identifiers.   I discussed the limitations, risks, security and privacy concerns of performing an evaluation and management service by telemedicine and the availability of in-person appointments. I also discussed with the patient that there may be a patient responsible charge related to this service. The patient expressed understanding and agreed to proceed.  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 Diagnostic mammogram and ultrasound of the left breast showed 3 x 4 x 3 mm hypoechoic mass at the 2 o'clock position. No pathologic left axillary adenopathy. 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 Patient has seen Dr. [name] and will be undergoing lumpectomy soon. No prior history of breast cancer or breast biopsies. Menarche at the age of 71. Menopause was close to 10 years ago. She is G2, P2. She did not breast-feed. No family history of breast, colon, pancreatic cancer.States that her mother had some cancer and she is not sure if it was ovarian cancer  Patient had a grade 1,7 mm invasive mucinous carcinoma with negative margins. Sentinel lymph node biopsy was negative for malignancy. Patient didnot need Oncotype testing at this time given that  her tumor was less than 10 mm and grade 1. She completed adjuvant RT.[name]  Patient was started on Arimidex in December 2019.  She could not tolerate it due to significant nausea and tremors and was switched to Aromasin. She could not tolerate aromasin due to nausea and currently she is on letrozole  Interval history Patient has been on letrozole for the last 1 week. She had 1 episode of nausea that was self limited. Overall she feels she is tolerating letrozole much better. Denies any new joint pains   Review of Systems  Constitutional: Negative for chills, fever, malaise/fatigue and weight loss.  HENT: Negative for congestion, ear discharge and nosebleeds.   Eyes: Negative for blurred vision.  Respiratory: Negative for cough, hemoptysis, sputum production, shortness of breath and wheezing.   Cardiovascular: Negative for chest pain, palpitations, orthopnea and claudication.  Gastrointestinal: Positive for nausea. Negative for abdominal pain, blood in stool, constipation, diarrhea, heartburn, melena and vomiting.  Genitourinary: Negative for dysuria, flank pain, frequency, hematuria and urgency.  Musculoskeletal: Negative for back pain, joint pain and myalgias.  Skin: Negative for rash.  Neurological: Negative for dizziness, tingling, focal weakness, seizures, weakness and headaches.  Endo/Heme/Allergies: Does not bruise/bleed easily.  Psychiatric/Behavioral: Negative for depression and suicidal ideas. The patient does not have insomnia.     Allergies  Allergen Reactions  . Penicillins Hives, Swelling and Other (See Comments)    "knots" on legs Has patient had a PCN reaction causing immediate rash, facial/tongue/throat swelling, SOB or lightheadedness with hypotension: No Has patient had a PCN reaction causing severe rash involving mucus membranes or skin necrosis: No Has patient had a PCN reaction that  required hospitalization: No Has patient had a PCN reaction occurring within the last 10  years: No If all of the above answers are "NO", then may proceed with Cephalosporin use.    [name] Oxycodone-Acetaminophen Anxiety and Other (See Comments)    confusion    Past Medical History:  Diagnosis Date  . Breast cancer ([place])   . Complication of anesthesia   . Hyperlipidemia   . Hypertension   . Hypothyroidism   . PONV (postoperative nausea and vomiting)     Past Surgical History:  Procedure Laterality Date  . BACK SURGERY     lumbar ruptured disc  . BREAST BIOPSY Left 9/5/20019   u/s core bc path pending  . BREAST LUMPECTOMY Left 12/09/2017   NL with SN  . FOOT SURGERY Bilateral    x 3 bunionectomy  . PARTIAL MASTECTOMY WITH NEEDLE LOCALIZATION AND AXILLARY SENTINEL LYMPH NODE BX Left 12/09/2017   Procedure: PARTIAL MASTECTOMY WITH NEEDLE LOCALIZATION AND AXILLARY SENTINEL LYMPH NODE BX;  Surgeon: Benjamine Sprague, DO;  Location: [facility] ORS;  Service: General;  Laterality: Left;    Social History   Socioeconomic History  . Marital status: Married    Spouse name: Not on file  . Number of children: Not on file  . Years of education: Not on file  . Highest education level: Not on file  Occupational History  . Not on file  Social Needs  . Financial resource strain: Not on file  . Food insecurity:    Worry: Not on file    Inability: Not on file  . Transportation needs:    Medical: Not on file    Non-medical: Not on file  Tobacco Use  . Smoking status: Former Smoker    Types: Cigarettes    Last attempt to quit: 12/08/1968    Years since quitting: 49.6  . Smokeless tobacco: Never Used  . Tobacco comment: quit smoking over 50 yrs ago  Substance and Sexual Activity  . Alcohol use: Yes    Comment: rare just glass  . Drug use: Never  . Sexual activity: Not Currently  Lifestyle  . Physical activity:    Days per week: Not on file    Minutes per session: Not on file  . Stress: Not on file  Relationships  . Social connections:    Talks on phone: Not on file    Gets  together: Not on file    Attends religious service: Not on file    Active member of club or organization: Not on file    Attends meetings of clubs or organizations: Not on file    Relationship status: Not on file  . Intimate partner violence:    Fear of current or ex partner: Not on file    Emotionally abused: Not on file    Physically abused: Not on file    Forced sexual activity: Not on file  Other Topics Concern  . Not on file  Social History Narrative  . Not on file    Family History  Problem Relation Age of Onset  . Ovarian cancer Mother   . Heart disease Father   . Diabetes Father   . Breast cancer Neg Hx      Current Outpatient Medications:  .  atorvastatin (LIPITOR) 80 MG tablet, Take 80 mg by mouth daily., Disp: , Rfl:  .  Cholecalciferol (VITAMIN D3) 1000 units CAPS, Take 1,000 Units by mouth 2 (two) times daily., Disp: , Rfl:  .  citalopram (CELEXA)  20 MG tablet, Take 20 mg by mouth daily., Disp: , Rfl:  .  ibuprofen (ADVIL,MOTRIN) 800 MG tablet, Take 1 tablet (800 mg total) by mouth every 8 (eight) hours as needed for mild pain or moderate pain., Disp: 30 tablet, Rfl: 0 .  lansoprazole (PREVACID) 30 MG capsule, Take 1 capsule (30 mg total) by mouth daily at 12 noon., Disp: 30 capsule, Rfl: 2 .  latanoprost (XALATAN) 0.005 % ophthalmic solution, Place 1 drop into both eyes at bedtime., Disp: , Rfl:  .  letrozole (FEMARA) 2.5 MG tablet, Take 1 tablet (2.5 mg total) by mouth daily., Disp: 30 tablet, Rfl: 2 .  levothyroxine (SYNTHROID, LEVOTHROID) 50 MCG tablet, Take 50 mcg by mouth daily before breakfast., Disp: , Rfl:  .  losartan-hydrochlorothiazide (HYZAAR) 50-12.5 MG tablet, Take 1 tablet by mouth daily., Disp: , Rfl:  .  Multiple Vitamin (MULTIVITAMIN WITH MINERALS) TABS tablet, Take 1 tablet by mouth daily., Disp: , Rfl:  .  ondansetron (ZOFRAN) 4 MG tablet, TAKE 1 TABLET BY MOUTH EVERY 8 HOURS AS NEEDED FOR NAUSEA OR VOMITING (Patient not taking: Reported on  07/31/2018), Disp: 20 tablet, Rfl: 0 .  promethazine (PHENERGAN) 25 MG tablet, Take 1 tablet (25 mg total) by mouth every 6 (six) hours as needed for nausea or vomiting. (Patient not taking: Reported on 07/31/2018), Disp: 40 tablet, Rfl: 1  US Renal  Result Date: 07/17/2018 CLINICAL DATA:  Acute kidney injury. EXAM: RENAL / URINARY TRACT ULTRASOUND COMPLETE COMPARISON:  Abdominal ultrasound dated Aug 06, 2010. FINDINGS: Right Kidney: Renal measurements: 11.6 x 5.0 x 5.4 cm = volume: 163 mL . Echogenicity within normal limits. No mass or hydronephrosis visualized. Left Kidney: Renal measurements: 11.8 x 5.7 x 5.2 cm = volume: 182 mL. Echogenicity within normal limits. No mass or hydronephrosis visualized. Bladder: Appears normal for degree of bladder distention. IMPRESSION: 1. Normal renal ultrasound. Electronically Signed   By: Titus Dubin M.D.   On: 07/17/2018 13:52    No images are attached to the encounter.   CMP Latest Ref Rng & Units 05/29/2018  Glucose 70 - 99 mg/dL 105(H)  BUN 8 - 23 mg/dL 16  Creatinine 0.44 - 1.00 mg/dL 0.68  Sodium 135 - 145 mmol/L 135  Potassium 3.5 - 5.1 mmol/L 3.6  Chloride 98 - 111 mmol/L 99  CO2 22 - 32 mmol/L 26  Calcium 8.9 - 10.3 mg/dL 9.0  Total Protein 6.5 - 8.1 g/dL 8.1  Total Bilirubin 0.3 - 1.2 mg/dL 0.6  Alkaline Phos 38 - 126 U/L 102  AST 15 - 41 U/L 36  ALT 0 - 44 U/L 37   CBC Latest Ref Rng & Units 02/13/2018  WBC 4.0 - 10.5 K/uL 4.4  Hemoglobin 12.0 - 15.0 g/dL 12.2  Hematocrit 36.0 - 46.0 % 36.0  Platelets 150 - 400 K/uL 282     Observation/objective:appears in no acute distress over video visit today. Breathing is non labored  Assessment and plan:Patient is a 73 y.o. female pathological prognostic stage IApT1b pN0 cM0 invasive mucinous carcinoma of the left breast ER PR positive HER-2/neu negative status post lumpectomy. This is a surveillance visit for breast cancer and to assess tolerance to endocrine therapy  Patient was unable to  tolerate both arimidex and aromasin due to nausea and tremors. She seems to be tolerating letrozole better. She will continue taking that for 5 years. If she has problems with letrozole the only option left would be tamoxifen  She would be due for  bone density scan next year.  Mammogram due in sept 2019  Follow-up instructions: f/u in 3 months  I discussed the assessment and treatment plan with the patient. The patient was provided an opportunity to ask questions and all were answered. The patient agreed with the plan and demonstrated an understanding of the instructions.   The patient was advised to call back or seek an in-person evaluation if the symptoms worsen or if the condition fails to improve as anticipated.    Visit Diagnosis: 1. Encounter for follow-up surveillance of breast cancer   2. Use of letrozole (Femara)     Dr. Randa Evens, MD, MPH [facility] at [facility] [phone] 08/02/2018 5:23 PM

## 2018-08-13 DIAGNOSIS — R208 Other disturbances of skin sensation: Secondary | ICD-10-CM | POA: Diagnosis not present

## 2018-08-13 DIAGNOSIS — L820 Inflamed seborrheic keratosis: Secondary | ICD-10-CM | POA: Diagnosis not present

## 2018-08-13 DIAGNOSIS — L298 Other pruritus: Secondary | ICD-10-CM | POA: Diagnosis not present

## 2018-08-20 ENCOUNTER — Other Ambulatory Visit: Payer: Self-pay

## 2018-08-21 ENCOUNTER — Other Ambulatory Visit: Payer: Self-pay | Admitting: *Deleted

## 2018-08-21 ENCOUNTER — Other Ambulatory Visit: Payer: Self-pay

## 2018-08-21 ENCOUNTER — Ambulatory Visit
Admission: RE | Admit: 2018-08-21 | Discharge: 2018-08-21 | Disposition: A | Discharge status: Home / Self Care | Payer: PPO | Source: Ambulatory Visit | Attending: Radiation Oncology | Admitting: Radiation Oncology

## 2018-08-21 ENCOUNTER — Encounter (INDEPENDENT_AMBULATORY_CARE_PROVIDER_SITE_OTHER): Payer: Self-pay

## 2018-08-21 ENCOUNTER — Encounter: Payer: Self-pay | Admitting: Radiation Oncology

## 2018-08-21 VITALS — BP 152/83 | HR 100 | Temp 98.8°F | Resp 20 | Wt 140.0 lb

## 2018-08-21 DIAGNOSIS — C50919 Malignant neoplasm of unspecified site of unspecified female breast: Secondary | ICD-10-CM

## 2018-08-21 DIAGNOSIS — Z79811 Long term (current) use of aromatase inhibitors: Secondary | ICD-10-CM | POA: Diagnosis not present

## 2018-08-21 DIAGNOSIS — Z923 Personal history of irradiation: Secondary | ICD-10-CM | POA: Insufficient documentation

## 2018-08-21 DIAGNOSIS — C50412 Malignant neoplasm of upper-outer quadrant of left female breast: Secondary | ICD-10-CM | POA: Diagnosis not present

## 2018-08-21 DIAGNOSIS — Z170 Estrogen receptor positive status [ER+]: Secondary | ICD-10-CM | POA: Diagnosis not present

## 2018-08-21 NOTE — Progress Notes (Signed)
Radiation Oncology Follow up Note  Name: Christy Braun   Date:   08/21/2018 MRN:  [id number] DOB: 09/22/1945    This 73 y.o. female presents to the clinic today for 69-monthfollow-up status post whole breast radiation to her left breast for an ER PR positive stage I invasive mammary carcinoma.  REFERRING PROVIDER: KAdin Hector MD  HPI: Patient is a 73year old female now seen at 6 months having completed whole breast radiation to her right breast for stage I ER PR positive HER-2 negative invasive mammary carcinoma status post wide local excision.  She is seen today in routine follow-up is doing well specifically denies breast tenderness cough or bone pain.  She is currently on letrozole has tried other antiestrogen therapies which caused significant side effects.  She is tolerating letrozole well.  She is not yet been scheduled for a mammogram she believes that will be in September and we will check on that for her..  COMPLICATIONS OF TREATMENT: none  FOLLOW UP COMPLIANCE: keeps appointments   PHYSICAL EXAM:  BP (!) 152/83   Pulse 100   Temp 98.8 F (37.1 C)   Resp 20   Wt 140 lb (63.5 kg)   BMI 23.30 kg/m  Lungs are clear to A&P cardiac examination essentially unremarkable with regular rate and rhythm. No dominant mass or nodularity is noted in either breast in 2 positions examined. Incision is well-healed. No axillary or supraclavicular adenopathy is appreciated. Cosmetic result is excellent.  Well-developed well-nourished patient in NAD. HEENT reveals PERLA, EOMI, discs not visualized.  Oral cavity is clear. No oral mucosal lesions are identified. Neck is clear without evidence of cervical or supraclavicular adenopathy. Lungs are clear to A&P. Cardiac examination is essentially unremarkable with regular rate and rhythm without murmur rub or thrill. Abdomen is benign with no organomegaly or masses noted. Motor sensory and DTR levels are equal and symmetric in the upper and lower  extremities. Cranial nerves II through XII are grossly intact. Proprioception is intact. No peripheral adenopathy or edema is identified. No motor or sensory levels are noted. Crude visual fields are within normal range.  RADIOLOGY RESULTS: No current films for review  PLAN: Present time patient is doing well with no evidence of disease 6 months out.  And pleased with her overall progress.  We will make sure she is having mammograms around September.  I have asked to see her back in 6 months and then will go to once a year follow-up appointments.  Patient knows to call with any concerns.  I would like to take this opportunity to thank you for allowing me to participate in the care of your patient..Noreene Filbert MD

## 2018-08-24 DIAGNOSIS — H40153 Residual stage of open-angle glaucoma, bilateral: Secondary | ICD-10-CM | POA: Diagnosis not present

## 2018-08-31 DIAGNOSIS — C50412 Malignant neoplasm of upper-outer quadrant of left female breast: Secondary | ICD-10-CM | POA: Diagnosis not present

## 2018-08-31 DIAGNOSIS — E7849 Other hyperlipidemia: Secondary | ICD-10-CM | POA: Diagnosis not present

## 2018-08-31 DIAGNOSIS — Z170 Estrogen receptor positive status [ER+]: Secondary | ICD-10-CM | POA: Diagnosis not present

## 2018-08-31 DIAGNOSIS — I10 Essential (primary) hypertension: Secondary | ICD-10-CM | POA: Diagnosis not present

## 2018-08-31 DIAGNOSIS — E559 Vitamin D deficiency, unspecified: Secondary | ICD-10-CM | POA: Diagnosis not present

## 2018-08-31 DIAGNOSIS — K760 Fatty (change of) liver, not elsewhere classified: Secondary | ICD-10-CM | POA: Diagnosis not present

## 2018-09-07 DIAGNOSIS — E7849 Other hyperlipidemia: Secondary | ICD-10-CM | POA: Diagnosis not present

## 2018-09-07 DIAGNOSIS — E559 Vitamin D deficiency, unspecified: Secondary | ICD-10-CM | POA: Diagnosis not present

## 2018-09-07 DIAGNOSIS — K760 Fatty (change of) liver, not elsewhere classified: Secondary | ICD-10-CM | POA: Diagnosis not present

## 2018-09-07 DIAGNOSIS — Z170 Estrogen receptor positive status [ER+]: Secondary | ICD-10-CM | POA: Diagnosis not present

## 2018-09-07 DIAGNOSIS — C50412 Malignant neoplasm of upper-outer quadrant of left female breast: Secondary | ICD-10-CM | POA: Diagnosis not present

## 2018-09-07 DIAGNOSIS — Z0000 Encounter for general adult medical examination without abnormal findings: Secondary | ICD-10-CM | POA: Diagnosis not present

## 2018-09-07 DIAGNOSIS — E034 Atrophy of thyroid (acquired): Secondary | ICD-10-CM | POA: Diagnosis not present

## 2018-09-07 DIAGNOSIS — R1013 Epigastric pain: Secondary | ICD-10-CM | POA: Diagnosis not present

## 2018-09-07 DIAGNOSIS — F3342 Major depressive disorder, recurrent, in full remission: Secondary | ICD-10-CM | POA: Diagnosis not present

## 2018-09-07 DIAGNOSIS — I10 Essential (primary) hypertension: Secondary | ICD-10-CM | POA: Diagnosis not present

## 2018-09-21 ENCOUNTER — Other Ambulatory Visit: Payer: Self-pay | Admitting: Oncology

## 2018-09-25 ENCOUNTER — Other Ambulatory Visit: Payer: Self-pay | Admitting: Oncology

## 2018-09-28 ENCOUNTER — Ambulatory Visit: Payer: PPO | Admitting: Oncology

## 2018-10-20 ENCOUNTER — Other Ambulatory Visit: Payer: Self-pay | Admitting: General Surgery

## 2018-10-20 DIAGNOSIS — Z853 Personal history of malignant neoplasm of breast: Secondary | ICD-10-CM

## 2018-10-21 ENCOUNTER — Other Ambulatory Visit: Payer: Self-pay | Admitting: *Deleted

## 2018-10-21 ENCOUNTER — Other Ambulatory Visit: Payer: Self-pay | Admitting: Oncology

## 2018-10-21 ENCOUNTER — Telehealth: Payer: Self-pay | Admitting: *Deleted

## 2018-10-21 NOTE — Telephone Encounter (Signed)
I reviewed pt chart and Dr. Janese Banks last saw patient in May and states that she is unable to take exemestane or anastrozole and she is tolerating Johnnette Gourd will remain on this drug to complete 5 years. I called the pharmacy and let them know that she is only on letrozole. I also said that it was electronic refill sent by pharmacy and we should have denied it. They have put on file that letrozole is the drug for breast cancer now.

## 2018-10-21 NOTE — Telephone Encounter (Signed)
msg rcvd on triage. Pharmacy received script for Patient to have an aromasin RF today. Pt just filled script for Femara. Please clarify which med the patient should be taking and notify the pharmacy accordingly. Thanks.

## 2018-11-06 ENCOUNTER — Other Ambulatory Visit: Payer: PPO

## 2018-11-11 DIAGNOSIS — Z170 Estrogen receptor positive status [ER+]: Secondary | ICD-10-CM | POA: Diagnosis not present

## 2018-11-11 DIAGNOSIS — C50412 Malignant neoplasm of upper-outer quadrant of left female breast: Secondary | ICD-10-CM | POA: Diagnosis not present

## 2018-11-13 ENCOUNTER — Inpatient Hospital Stay: Payer: PPO | Attending: Oncology | Admitting: Oncology

## 2018-11-20 ENCOUNTER — Ambulatory Visit
Admission: RE | Admit: 2018-11-20 | Discharge: 2018-11-20 | Disposition: A | Discharge status: Home / Self Care | Payer: PPO | Source: Ambulatory Visit | Attending: General Surgery | Admitting: General Surgery

## 2018-11-20 DIAGNOSIS — Z853 Personal history of malignant neoplasm of breast: Secondary | ICD-10-CM | POA: Diagnosis not present

## 2018-11-20 DIAGNOSIS — R928 Other abnormal and inconclusive findings on diagnostic imaging of breast: Secondary | ICD-10-CM | POA: Diagnosis not present

## 2018-11-20 HISTORY — DX: Personal history of irradiation: Z92.3

## 2018-12-04 DIAGNOSIS — K760 Fatty (change of) liver, not elsewhere classified: Secondary | ICD-10-CM | POA: Diagnosis not present

## 2018-12-04 DIAGNOSIS — Z86010 Personal history of colonic polyps: Secondary | ICD-10-CM | POA: Diagnosis not present

## 2018-12-09 IMAGING — MG MM BREAST LOCALIZATION CLIP
2 series · 2 of 2 positions shown · non-contrast
Comparison: Previous exam(s).

CLINICAL DATA: Post ultrasound-guided core needle biopsy of left
breast 2 o'clock nodule.

EXAM:
DIAGNOSTIC LEFT MAMMOGRAM POST ULTRASOUND BIOPSY

[L CC]
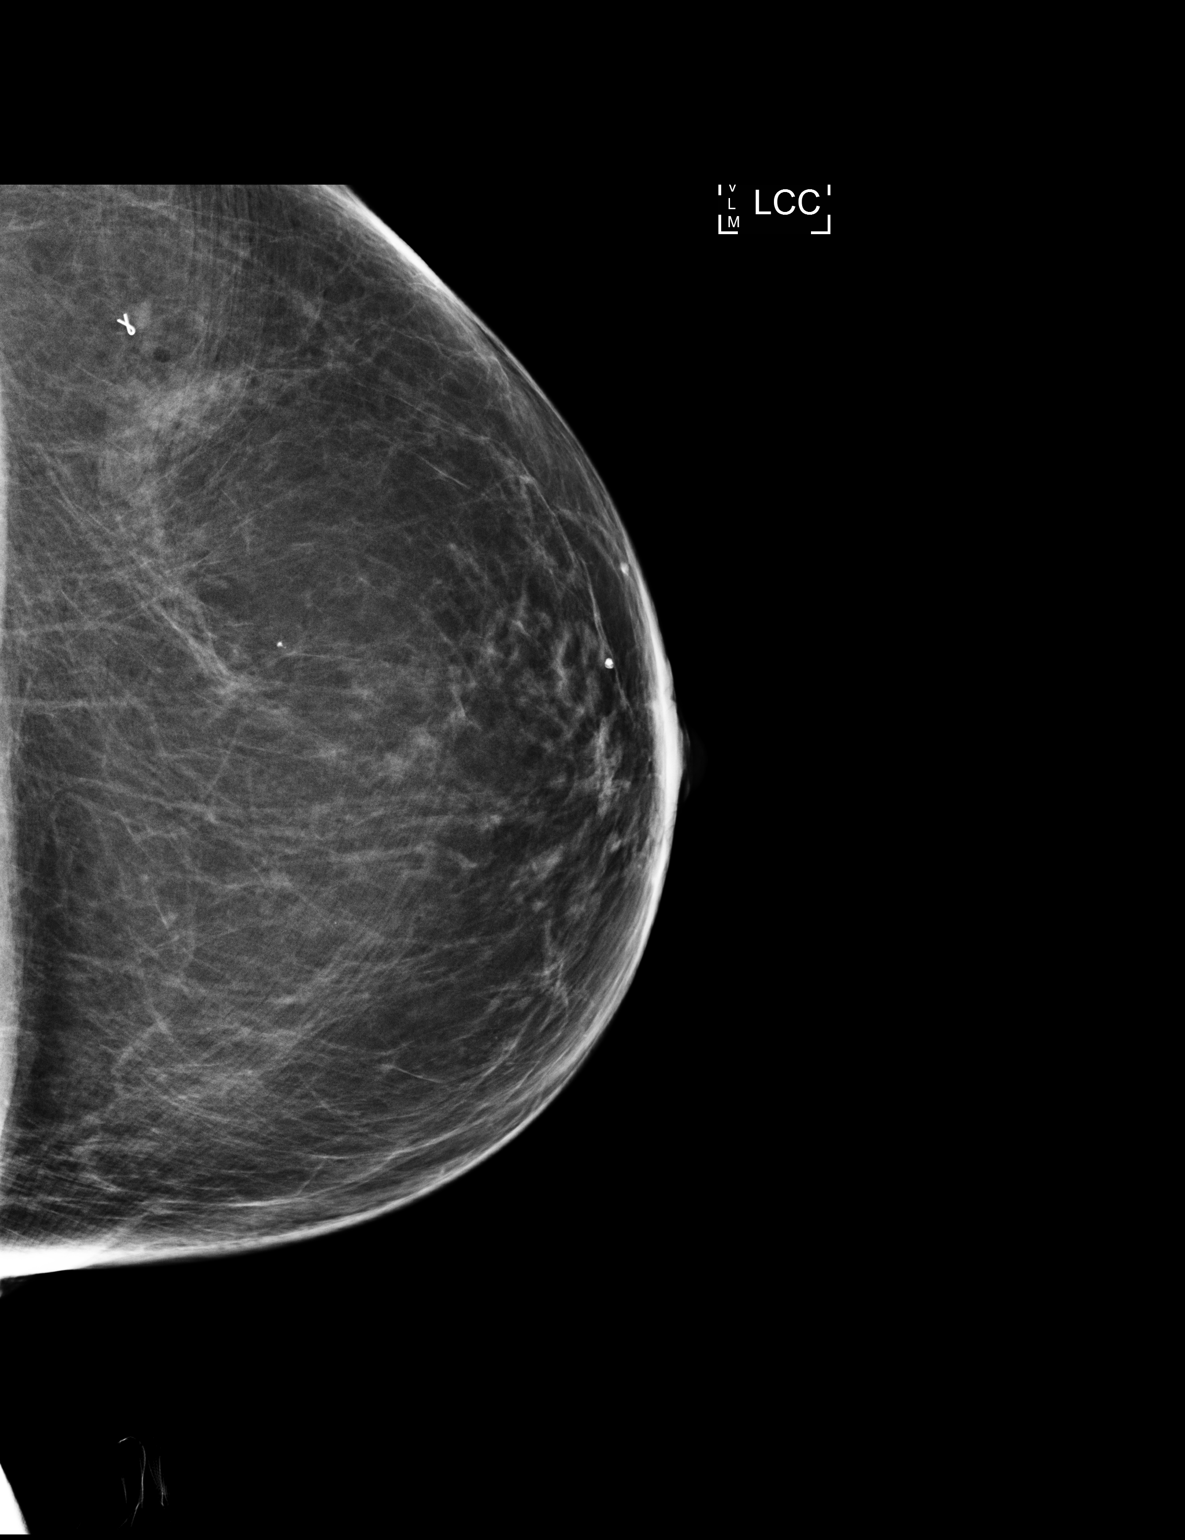

[L ML]
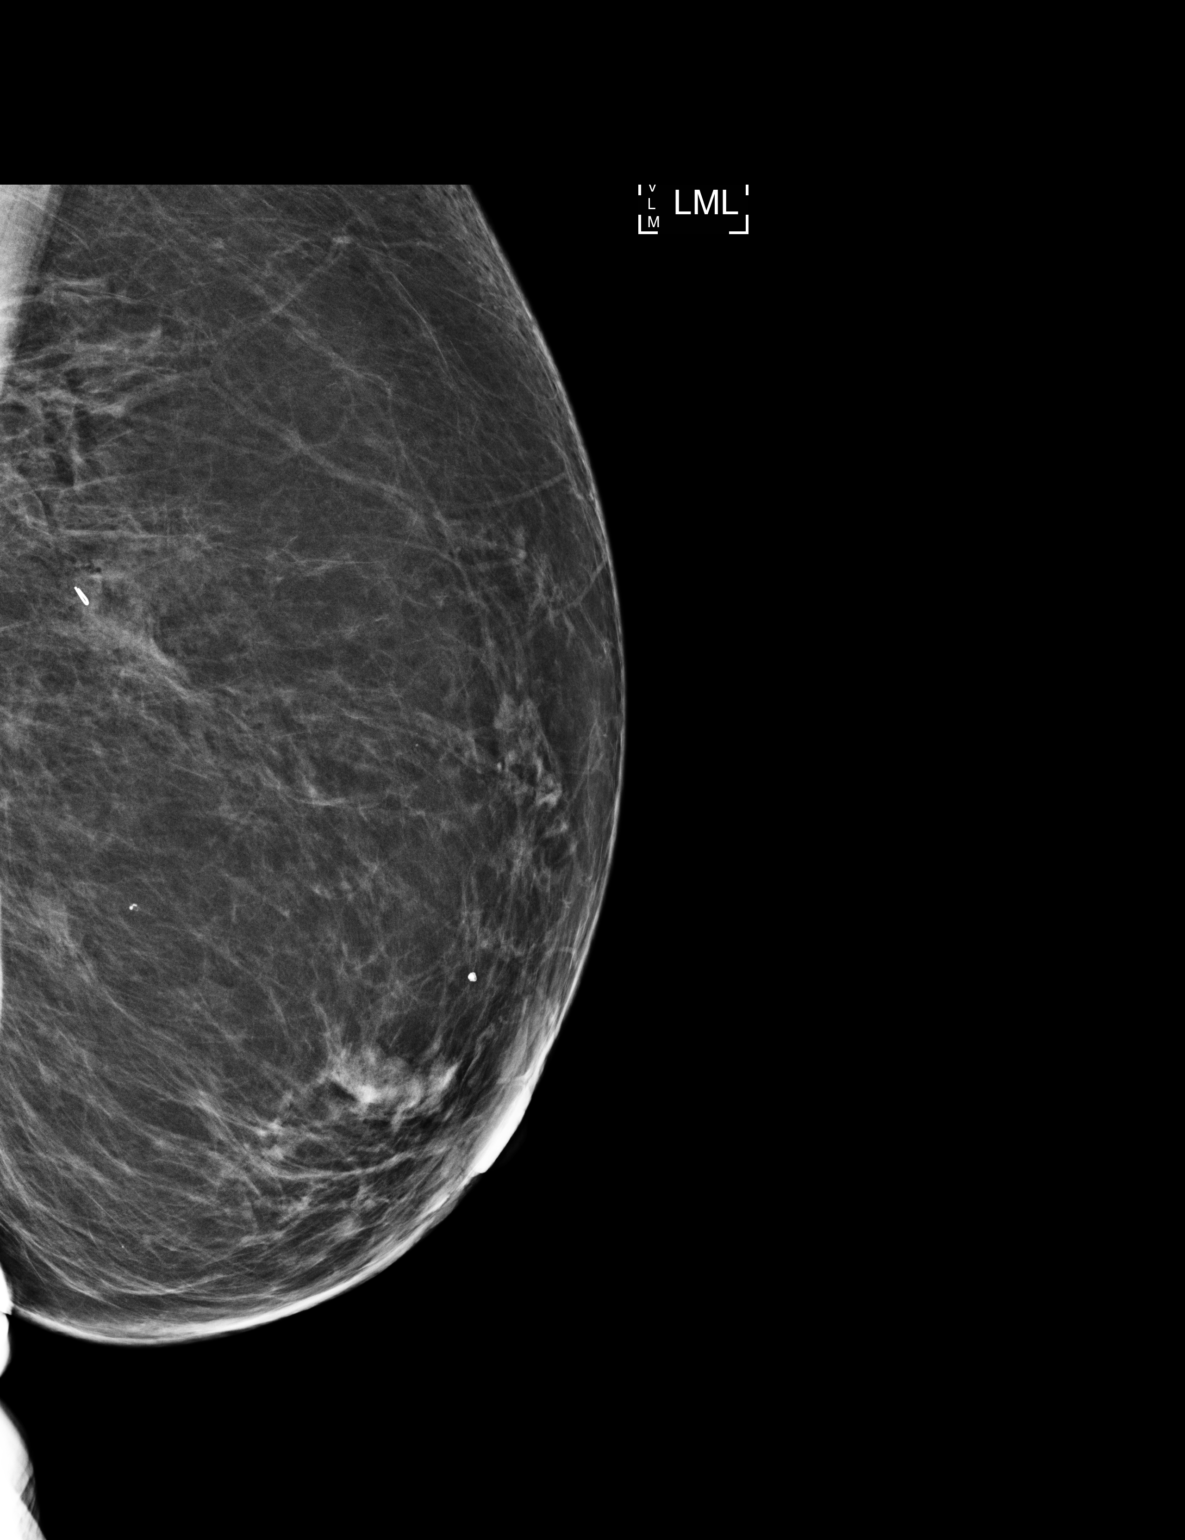

[2 of 2 positions shown; findings below may reference images not displayed]

FINDINGS: Mammographic images were obtained following ultrasound guided biopsy
of left breast 2 o'clock nodule. Two-view mammography demonstrates
presence of ribbon shaped marker within the biopsy site in
appropriate mammographic position.
IMPRESSION: Successful placement of ribbon shaped marker post ultrasound-guided
core needle biopsy of left breast 2 o'clock nodule.

Final Assessment: Post Procedure Mammograms for Marker Placement

## 2018-12-28 ENCOUNTER — Telehealth: Payer: Self-pay | Admitting: *Deleted

## 2018-12-28 NOTE — Telephone Encounter (Signed)
I called pt on home phone and there was a beep -but no message and I started talking to leave a message and 20 sec later the phone beeped again and was disconnected. I then called her cell phone and got voicemail and left message asking her to call me back to discuss nausea

## 2018-12-28 NOTE — Telephone Encounter (Signed)
Pt called back and said that sometimes she has some nausea but she is having jitters, trembles. She states that she has been on all the other meds for a while so she states that she feels that the letrozole is causing her issues. I told her to come off med for 2 weeks and then call me with if she is still having the jitters. I gave her my direct number to call me back. This ould be the 3rd AI pt has been on so the only other drug to try would be tamoxifen

## 2018-12-28 NOTE — Telephone Encounter (Signed)
Patient called and reports that the medicine is making her sick and she would like a return call.

## 2018-12-31 DIAGNOSIS — H40153 Residual stage of open-angle glaucoma, bilateral: Secondary | ICD-10-CM | POA: Diagnosis not present

## 2019-01-13 ENCOUNTER — Telehealth: Payer: Self-pay | Admitting: *Deleted

## 2019-01-13 MED ORDER — TAMOXIFEN CITRATE 20 MG PO TABS: 20.0000 mg | ORAL_TABLET | Freq: Every day | ORAL | 0 refills | Status: DC

## 2019-01-13 NOTE — Telephone Encounter (Signed)
Can you please ask her if she is willing to try tamoxifen and if so please send her a prescription for 20 mg tamoxifen. Thanks, Astrid Divine

## 2019-01-13 NOTE — Telephone Encounter (Signed)
Attempted twice to call patient and it hung up after 5 rings both times

## 2019-01-13 NOTE — Telephone Encounter (Signed)
I spoke with patient and she is agreeable to try Tamoxifen. Prescription sent

## 2019-01-13 NOTE — Telephone Encounter (Signed)
Patient called reporting that she has been off her "cancer medicine" for 2 weeks and is asking if you want her to go on a different medicine. Please advise

## 2019-01-15 ENCOUNTER — Telehealth: Payer: Self-pay | Admitting: Oncology

## 2019-01-15 NOTE — Telephone Encounter (Signed)
LM to try and R/S NS appt-ltg °

## 2019-01-29 DIAGNOSIS — L821 Other seborrheic keratosis: Secondary | ICD-10-CM | POA: Diagnosis not present

## 2019-02-15 ENCOUNTER — Other Ambulatory Visit: Payer: Self-pay | Admitting: Oncology

## 2019-02-23 ENCOUNTER — Other Ambulatory Visit: Payer: Self-pay

## 2019-02-23 ENCOUNTER — Other Ambulatory Visit: Admission: RE | Admit: 2019-02-23 | Payer: PPO | Source: Ambulatory Visit

## 2019-02-24 ENCOUNTER — Other Ambulatory Visit: Payer: Self-pay

## 2019-02-24 ENCOUNTER — Encounter: Payer: Self-pay | Admitting: Radiation Oncology

## 2019-02-24 ENCOUNTER — Ambulatory Visit
Admission: RE | Admit: 2019-02-24 | Discharge: 2019-02-24 | Disposition: A | Discharge status: Home / Self Care | Payer: PPO | Source: Ambulatory Visit | Attending: Radiation Oncology | Admitting: Radiation Oncology

## 2019-02-24 VITALS — BP 163/96 | HR 84 | Temp 97.0°F | Resp 18 | Wt 136.0 lb

## 2019-02-24 DIAGNOSIS — Z79811 Long term (current) use of aromatase inhibitors: Secondary | ICD-10-CM | POA: Diagnosis not present

## 2019-02-24 DIAGNOSIS — C50412 Malignant neoplasm of upper-outer quadrant of left female breast: Secondary | ICD-10-CM | POA: Diagnosis not present

## 2019-02-24 DIAGNOSIS — Z170 Estrogen receptor positive status [ER+]: Secondary | ICD-10-CM | POA: Insufficient documentation

## 2019-02-24 DIAGNOSIS — C50919 Malignant neoplasm of unspecified site of unspecified female breast: Secondary | ICD-10-CM

## 2019-02-24 DIAGNOSIS — Z923 Personal history of irradiation: Secondary | ICD-10-CM | POA: Diagnosis not present

## 2019-02-24 NOTE — Progress Notes (Signed)
Radiation Oncology Follow up Note  Name: Christy Braun   Date:   02/24/2019 MRN:  [id number] DOB: 08-15-1945    This 73 y.o. female presents to the clinic today for 1 year follow-up status post whole breast radiation to her left breast for ER/PR positive stage I invasive mammary carcinoma.  REFERRING PROVIDER: Adin Hector, MD  HPI: Patient is a 73 year old female now out 1 year having completed whole breast radiation to her left breast for stage I ER/PR positive invasive mammary carcinoma.  Seen today in routine follow-up she is doing well.  She specifically denies breast tenderness cough or bone pain..  She is currently on Femara tolerating that well without side effect.  She had mammograms back in August which I have reviewed were BI-RADS 2 benign.  COMPLICATIONS OF TREATMENT: none  FOLLOW UP COMPLIANCE: keeps appointments   PHYSICAL EXAM:  BP (!) 163/96   Pulse 84   Temp (!) 97 F (36.1 C)   Resp 18   Wt 136 lb (61.7 kg)   BMI 22.63 kg/m  Lungs are clear to A&P cardiac examination essentially unremarkable with regular rate and rhythm. No dominant mass or nodularity is noted in either breast in 2 positions examined. Incision is well-healed. No axillary or supraclavicular adenopathy is appreciated. Cosmetic result is excellent.  Well-developed well-nourished patient in NAD. HEENT reveals PERLA, EOMI, discs not visualized.  Oral cavity is clear. No oral mucosal lesions are identified. Neck is clear without evidence of cervical or supraclavicular adenopathy. Lungs are clear to A&P. Cardiac examination is essentially unremarkable with regular rate and rhythm without murmur rub or thrill. Abdomen is benign with no organomegaly or masses noted. Motor sensory and DTR levels are equal and symmetric in the upper and lower extremities. Cranial nerves II through XII are grossly intact. Proprioception is intact. No peripheral adenopathy or edema is identified. No motor or sensory levels are  noted. Crude visual fields are within normal range.  RADIOLOGY RESULTS: Mammograms reviewed compatible with above-stated findings  PLAN: Present time patient is doing well one 1 year out with no evidence of disease.  I am pleased with her overall progress.  She continues on Femara without side effect.  I have asked to see her back in 1 year for follow-up.  She knows to call at anytime with any concerns.  I would like to take this opportunity to thank you for allowing me to participate in the care of your patient.Noreene Filbert, MD

## 2019-02-26 ENCOUNTER — Encounter: Admission: RE | Payer: Self-pay | Source: Home / Self Care

## 2019-02-26 ENCOUNTER — Ambulatory Visit: Admission: RE | Admit: 2019-02-26 | Payer: PPO | Source: Home / Self Care | Admitting: General Surgery

## 2019-02-26 SURGERY — COLONOSCOPY WITH PROPOFOL
Anesthesia: General

## 2019-03-11 DIAGNOSIS — C50412 Malignant neoplasm of upper-outer quadrant of left female breast: Secondary | ICD-10-CM | POA: Diagnosis not present

## 2019-03-11 DIAGNOSIS — E034 Atrophy of thyroid (acquired): Secondary | ICD-10-CM | POA: Diagnosis not present

## 2019-03-11 DIAGNOSIS — E7849 Other hyperlipidemia: Secondary | ICD-10-CM | POA: Diagnosis not present

## 2019-03-11 DIAGNOSIS — Z170 Estrogen receptor positive status [ER+]: Secondary | ICD-10-CM | POA: Diagnosis not present

## 2019-03-11 DIAGNOSIS — I10 Essential (primary) hypertension: Secondary | ICD-10-CM | POA: Diagnosis not present

## 2019-03-11 DIAGNOSIS — K760 Fatty (change of) liver, not elsewhere classified: Secondary | ICD-10-CM | POA: Diagnosis not present

## 2019-03-15 DIAGNOSIS — Z170 Estrogen receptor positive status [ER+]: Secondary | ICD-10-CM | POA: Diagnosis not present

## 2019-03-15 DIAGNOSIS — E559 Vitamin D deficiency, unspecified: Secondary | ICD-10-CM | POA: Diagnosis not present

## 2019-03-15 DIAGNOSIS — E034 Atrophy of thyroid (acquired): Secondary | ICD-10-CM | POA: Diagnosis not present

## 2019-03-15 DIAGNOSIS — F3342 Major depressive disorder, recurrent, in full remission: Secondary | ICD-10-CM | POA: Diagnosis not present

## 2019-03-15 DIAGNOSIS — C50412 Malignant neoplasm of upper-outer quadrant of left female breast: Secondary | ICD-10-CM | POA: Diagnosis not present

## 2019-03-15 DIAGNOSIS — Z8739 Personal history of other diseases of the musculoskeletal system and connective tissue: Secondary | ICD-10-CM | POA: Diagnosis not present

## 2019-03-15 DIAGNOSIS — I10 Essential (primary) hypertension: Secondary | ICD-10-CM | POA: Diagnosis not present

## 2019-03-15 DIAGNOSIS — R1013 Epigastric pain: Secondary | ICD-10-CM | POA: Diagnosis not present

## 2019-03-15 DIAGNOSIS — E7849 Other hyperlipidemia: Secondary | ICD-10-CM | POA: Diagnosis not present

## 2019-03-31 ENCOUNTER — Other Ambulatory Visit: Admission: RE | Admit: 2019-03-31 | Payer: PPO | Source: Ambulatory Visit

## 2019-04-02 ENCOUNTER — Ambulatory Visit: Admit: 2019-04-02 | Payer: PPO | Admitting: General Surgery

## 2019-04-02 SURGERY — COLONOSCOPY WITH PROPOFOL
Anesthesia: General

## 2019-04-30 DIAGNOSIS — F411 Generalized anxiety disorder: Secondary | ICD-10-CM | POA: Diagnosis not present

## 2019-05-11 DIAGNOSIS — H40153 Residual stage of open-angle glaucoma, bilateral: Secondary | ICD-10-CM | POA: Diagnosis not present

## 2019-05-19 DIAGNOSIS — M1711 Unilateral primary osteoarthritis, right knee: Secondary | ICD-10-CM | POA: Diagnosis not present

## 2019-05-19 DIAGNOSIS — M25461 Effusion, right knee: Secondary | ICD-10-CM | POA: Diagnosis not present

## 2019-05-19 DIAGNOSIS — M11261 Other chondrocalcinosis, right knee: Secondary | ICD-10-CM | POA: Diagnosis not present

## 2019-05-19 DIAGNOSIS — M25561 Pain in right knee: Secondary | ICD-10-CM | POA: Diagnosis not present

## 2019-06-02 DIAGNOSIS — M25461 Effusion, right knee: Secondary | ICD-10-CM | POA: Diagnosis not present

## 2019-06-02 DIAGNOSIS — M25561 Pain in right knee: Secondary | ICD-10-CM | POA: Diagnosis not present

## 2019-06-02 DIAGNOSIS — M1120 Other chondrocalcinosis, unspecified site: Secondary | ICD-10-CM | POA: Diagnosis not present

## 2019-06-02 DIAGNOSIS — M1711 Unilateral primary osteoarthritis, right knee: Secondary | ICD-10-CM | POA: Diagnosis not present

## 2019-07-13 ENCOUNTER — Other Ambulatory Visit: Admission: RE | Admit: 2019-07-13 | Payer: PPO | Source: Ambulatory Visit

## 2019-07-21 DIAGNOSIS — M19011 Primary osteoarthritis, right shoulder: Secondary | ICD-10-CM | POA: Diagnosis not present

## 2019-07-21 DIAGNOSIS — M25511 Pain in right shoulder: Secondary | ICD-10-CM | POA: Diagnosis not present

## 2019-07-21 DIAGNOSIS — M7541 Impingement syndrome of right shoulder: Secondary | ICD-10-CM | POA: Diagnosis not present

## 2019-09-06 DIAGNOSIS — H40153 Residual stage of open-angle glaucoma, bilateral: Secondary | ICD-10-CM | POA: Diagnosis not present

## 2019-09-07 DIAGNOSIS — Z8739 Personal history of other diseases of the musculoskeletal system and connective tissue: Secondary | ICD-10-CM | POA: Diagnosis not present

## 2019-09-07 DIAGNOSIS — E559 Vitamin D deficiency, unspecified: Secondary | ICD-10-CM | POA: Diagnosis not present

## 2019-09-07 DIAGNOSIS — E7849 Other hyperlipidemia: Secondary | ICD-10-CM | POA: Diagnosis not present

## 2019-09-07 DIAGNOSIS — R1013 Epigastric pain: Secondary | ICD-10-CM | POA: Diagnosis not present

## 2019-09-07 DIAGNOSIS — I10 Essential (primary) hypertension: Secondary | ICD-10-CM | POA: Diagnosis not present

## 2019-09-07 DIAGNOSIS — C50412 Malignant neoplasm of upper-outer quadrant of left female breast: Secondary | ICD-10-CM | POA: Diagnosis not present

## 2019-09-07 DIAGNOSIS — Z170 Estrogen receptor positive status [ER+]: Secondary | ICD-10-CM | POA: Diagnosis not present

## 2019-09-14 DIAGNOSIS — Z0000 Encounter for general adult medical examination without abnormal findings: Secondary | ICD-10-CM | POA: Diagnosis not present

## 2019-09-14 DIAGNOSIS — C50412 Malignant neoplasm of upper-outer quadrant of left female breast: Secondary | ICD-10-CM | POA: Diagnosis not present

## 2019-09-14 DIAGNOSIS — E7849 Other hyperlipidemia: Secondary | ICD-10-CM | POA: Diagnosis not present

## 2019-09-14 DIAGNOSIS — Z780 Asymptomatic menopausal state: Secondary | ICD-10-CM | POA: Diagnosis not present

## 2019-09-14 DIAGNOSIS — E034 Atrophy of thyroid (acquired): Secondary | ICD-10-CM | POA: Diagnosis not present

## 2019-09-14 DIAGNOSIS — R748 Abnormal levels of other serum enzymes: Secondary | ICD-10-CM | POA: Diagnosis not present

## 2019-09-14 DIAGNOSIS — I10 Essential (primary) hypertension: Secondary | ICD-10-CM | POA: Diagnosis not present

## 2019-09-14 DIAGNOSIS — F3342 Major depressive disorder, recurrent, in full remission: Secondary | ICD-10-CM | POA: Diagnosis not present

## 2019-09-14 DIAGNOSIS — E559 Vitamin D deficiency, unspecified: Secondary | ICD-10-CM | POA: Diagnosis not present

## 2019-09-14 DIAGNOSIS — Z170 Estrogen receptor positive status [ER+]: Secondary | ICD-10-CM | POA: Diagnosis not present

## 2019-09-14 DIAGNOSIS — Z1211 Encounter for screening for malignant neoplasm of colon: Secondary | ICD-10-CM | POA: Diagnosis not present

## 2019-09-14 DIAGNOSIS — M25511 Pain in right shoulder: Secondary | ICD-10-CM | POA: Diagnosis not present

## 2019-09-20 DIAGNOSIS — M8588 Other specified disorders of bone density and structure, other site: Secondary | ICD-10-CM | POA: Diagnosis not present

## 2019-10-11 DIAGNOSIS — H2511 Age-related nuclear cataract, right eye: Secondary | ICD-10-CM | POA: Diagnosis not present

## 2019-10-11 DIAGNOSIS — H25043 Posterior subcapsular polar age-related cataract, bilateral: Secondary | ICD-10-CM | POA: Diagnosis not present

## 2019-10-11 DIAGNOSIS — H401131 Primary open-angle glaucoma, bilateral, mild stage: Secondary | ICD-10-CM | POA: Diagnosis not present

## 2019-10-11 DIAGNOSIS — H2513 Age-related nuclear cataract, bilateral: Secondary | ICD-10-CM | POA: Diagnosis not present

## 2019-10-11 DIAGNOSIS — H25013 Cortical age-related cataract, bilateral: Secondary | ICD-10-CM | POA: Diagnosis not present

## 2019-10-14 DIAGNOSIS — R748 Abnormal levels of other serum enzymes: Secondary | ICD-10-CM | POA: Diagnosis not present

## 2019-10-25 DIAGNOSIS — H2511 Age-related nuclear cataract, right eye: Secondary | ICD-10-CM | POA: Diagnosis not present

## 2019-10-25 DIAGNOSIS — Z170 Estrogen receptor positive status [ER+]: Secondary | ICD-10-CM | POA: Diagnosis not present

## 2019-10-25 DIAGNOSIS — I10 Essential (primary) hypertension: Secondary | ICD-10-CM | POA: Diagnosis not present

## 2019-10-25 DIAGNOSIS — C50412 Malignant neoplasm of upper-outer quadrant of left female breast: Secondary | ICD-10-CM | POA: Diagnosis not present

## 2019-10-25 DIAGNOSIS — R413 Other amnesia: Secondary | ICD-10-CM | POA: Diagnosis not present

## 2019-10-25 DIAGNOSIS — E034 Atrophy of thyroid (acquired): Secondary | ICD-10-CM | POA: Diagnosis not present

## 2019-10-26 DIAGNOSIS — H25042 Posterior subcapsular polar age-related cataract, left eye: Secondary | ICD-10-CM | POA: Diagnosis not present

## 2019-10-26 DIAGNOSIS — H2512 Age-related nuclear cataract, left eye: Secondary | ICD-10-CM | POA: Diagnosis not present

## 2019-10-26 DIAGNOSIS — H2511 Age-related nuclear cataract, right eye: Secondary | ICD-10-CM | POA: Diagnosis not present

## 2019-10-26 DIAGNOSIS — H25811 Combined forms of age-related cataract, right eye: Secondary | ICD-10-CM | POA: Diagnosis not present

## 2019-10-26 DIAGNOSIS — H25012 Cortical age-related cataract, left eye: Secondary | ICD-10-CM | POA: Diagnosis not present

## 2019-10-27 ENCOUNTER — Other Ambulatory Visit: Admission: RE | Admit: 2019-10-27 | Payer: PPO | Source: Ambulatory Visit

## 2019-10-29 ENCOUNTER — Ambulatory Visit: Admission: RE | Admit: 2019-10-29 | Payer: PPO | Source: Home / Self Care | Admitting: Internal Medicine

## 2019-10-29 ENCOUNTER — Encounter: Admission: RE | Payer: Self-pay | Source: Home / Self Care

## 2019-10-29 SURGERY — COLONOSCOPY WITH PROPOFOL
Anesthesia: General

## 2019-11-01 DIAGNOSIS — H2512 Age-related nuclear cataract, left eye: Secondary | ICD-10-CM | POA: Diagnosis not present

## 2019-11-02 DIAGNOSIS — H2512 Age-related nuclear cataract, left eye: Secondary | ICD-10-CM | POA: Diagnosis not present

## 2019-11-10 DIAGNOSIS — H1130 Conjunctival hemorrhage, unspecified eye: Secondary | ICD-10-CM | POA: Diagnosis not present

## 2019-11-17 DIAGNOSIS — H40153 Residual stage of open-angle glaucoma, bilateral: Secondary | ICD-10-CM | POA: Diagnosis not present

## 2019-11-23 DIAGNOSIS — I10 Essential (primary) hypertension: Secondary | ICD-10-CM | POA: Diagnosis not present

## 2019-12-08 DIAGNOSIS — Z86010 Personal history of colonic polyps: Secondary | ICD-10-CM | POA: Diagnosis not present

## 2019-12-08 DIAGNOSIS — Z01818 Encounter for other preprocedural examination: Secondary | ICD-10-CM | POA: Diagnosis not present

## 2019-12-14 DIAGNOSIS — Z01818 Encounter for other preprocedural examination: Secondary | ICD-10-CM | POA: Diagnosis not present

## 2019-12-17 DIAGNOSIS — Z1211 Encounter for screening for malignant neoplasm of colon: Secondary | ICD-10-CM | POA: Diagnosis not present

## 2019-12-17 DIAGNOSIS — K5730 Diverticulosis of large intestine without perforation or abscess without bleeding: Secondary | ICD-10-CM | POA: Diagnosis not present

## 2019-12-17 DIAGNOSIS — Z86010 Personal history of colonic polyps: Secondary | ICD-10-CM | POA: Diagnosis not present

## 2019-12-17 DIAGNOSIS — K641 Second degree hemorrhoids: Secondary | ICD-10-CM | POA: Diagnosis not present

## 2019-12-20 DIAGNOSIS — H40153 Residual stage of open-angle glaucoma, bilateral: Secondary | ICD-10-CM | POA: Diagnosis not present

## 2020-02-24 ENCOUNTER — Ambulatory Visit: Payer: PPO | Attending: Radiation Oncology | Admitting: Radiation Oncology

## 2020-03-08 DIAGNOSIS — M25511 Pain in right shoulder: Secondary | ICD-10-CM | POA: Diagnosis not present

## 2020-03-08 DIAGNOSIS — Z170 Estrogen receptor positive status [ER+]: Secondary | ICD-10-CM | POA: Diagnosis not present

## 2020-03-08 DIAGNOSIS — I10 Essential (primary) hypertension: Secondary | ICD-10-CM | POA: Diagnosis not present

## 2020-03-08 DIAGNOSIS — E034 Atrophy of thyroid (acquired): Secondary | ICD-10-CM | POA: Diagnosis not present

## 2020-03-08 DIAGNOSIS — E7849 Other hyperlipidemia: Secondary | ICD-10-CM | POA: Diagnosis not present

## 2020-03-08 DIAGNOSIS — G8929 Other chronic pain: Secondary | ICD-10-CM | POA: Diagnosis not present

## 2020-03-08 DIAGNOSIS — E559 Vitamin D deficiency, unspecified: Secondary | ICD-10-CM | POA: Diagnosis not present

## 2020-03-08 DIAGNOSIS — C50412 Malignant neoplasm of upper-outer quadrant of left female breast: Secondary | ICD-10-CM | POA: Diagnosis not present

## 2020-03-15 DIAGNOSIS — Z170 Estrogen receptor positive status [ER+]: Secondary | ICD-10-CM | POA: Diagnosis not present

## 2020-03-15 DIAGNOSIS — C50412 Malignant neoplasm of upper-outer quadrant of left female breast: Secondary | ICD-10-CM | POA: Diagnosis not present

## 2020-03-15 DIAGNOSIS — E559 Vitamin D deficiency, unspecified: Secondary | ICD-10-CM | POA: Diagnosis not present

## 2020-03-15 DIAGNOSIS — I10 Essential (primary) hypertension: Secondary | ICD-10-CM | POA: Diagnosis not present

## 2020-03-15 DIAGNOSIS — D649 Anemia, unspecified: Secondary | ICD-10-CM | POA: Diagnosis not present

## 2020-03-15 DIAGNOSIS — F3342 Major depressive disorder, recurrent, in full remission: Secondary | ICD-10-CM | POA: Diagnosis not present

## 2020-03-15 DIAGNOSIS — R1013 Epigastric pain: Secondary | ICD-10-CM | POA: Diagnosis not present

## 2020-03-15 DIAGNOSIS — R413 Other amnesia: Secondary | ICD-10-CM | POA: Diagnosis not present

## 2020-03-15 DIAGNOSIS — E7849 Other hyperlipidemia: Secondary | ICD-10-CM | POA: Diagnosis not present

## 2020-03-15 DIAGNOSIS — E034 Atrophy of thyroid (acquired): Secondary | ICD-10-CM | POA: Diagnosis not present

## 2020-03-16 ENCOUNTER — Other Ambulatory Visit: Payer: Self-pay | Admitting: Internal Medicine

## 2020-03-16 DIAGNOSIS — R413 Other amnesia: Secondary | ICD-10-CM

## 2020-04-03 ENCOUNTER — Ambulatory Visit: Payer: PPO | Attending: Internal Medicine

## 2020-05-03 ENCOUNTER — Other Ambulatory Visit: Payer: Self-pay | Admitting: Internal Medicine

## 2020-05-03 DIAGNOSIS — Z1231 Encounter for screening mammogram for malignant neoplasm of breast: Secondary | ICD-10-CM

## 2020-05-05 DIAGNOSIS — D649 Anemia, unspecified: Secondary | ICD-10-CM | POA: Diagnosis not present

## 2020-05-09 DIAGNOSIS — H40153 Residual stage of open-angle glaucoma, bilateral: Secondary | ICD-10-CM | POA: Diagnosis not present

## 2020-05-23 DIAGNOSIS — E559 Vitamin D deficiency, unspecified: Secondary | ICD-10-CM | POA: Diagnosis not present

## 2020-05-23 DIAGNOSIS — R1013 Epigastric pain: Secondary | ICD-10-CM | POA: Diagnosis not present

## 2020-05-23 DIAGNOSIS — I10 Essential (primary) hypertension: Secondary | ICD-10-CM | POA: Diagnosis not present

## 2020-05-23 DIAGNOSIS — E7849 Other hyperlipidemia: Secondary | ICD-10-CM | POA: Diagnosis not present

## 2020-05-23 DIAGNOSIS — F3342 Major depressive disorder, recurrent, in full remission: Secondary | ICD-10-CM | POA: Diagnosis not present

## 2020-05-23 DIAGNOSIS — E034 Atrophy of thyroid (acquired): Secondary | ICD-10-CM | POA: Diagnosis not present

## 2020-05-23 DIAGNOSIS — Z170 Estrogen receptor positive status [ER+]: Secondary | ICD-10-CM | POA: Diagnosis not present

## 2020-05-23 DIAGNOSIS — C50412 Malignant neoplasm of upper-outer quadrant of left female breast: Secondary | ICD-10-CM | POA: Diagnosis not present

## 2020-05-24 DIAGNOSIS — R300 Dysuria: Secondary | ICD-10-CM | POA: Diagnosis not present

## 2020-05-29 ENCOUNTER — Emergency Department: Payer: PPO

## 2020-05-29 ENCOUNTER — Other Ambulatory Visit: Payer: Self-pay

## 2020-05-29 ENCOUNTER — Emergency Department
Admission: EM | Admit: 2020-05-29 | Discharge: 2020-05-29 | Disposition: A | Discharge status: Home / Self Care | Payer: PPO | Attending: Emergency Medicine | Admitting: Emergency Medicine

## 2020-05-29 DIAGNOSIS — S43101A Unspecified dislocation of right acromioclavicular joint, initial encounter: Secondary | ICD-10-CM | POA: Diagnosis not present

## 2020-05-29 DIAGNOSIS — I10 Essential (primary) hypertension: Secondary | ICD-10-CM | POA: Insufficient documentation

## 2020-05-29 DIAGNOSIS — E039 Hypothyroidism, unspecified: Secondary | ICD-10-CM | POA: Diagnosis not present

## 2020-05-29 DIAGNOSIS — Z853 Personal history of malignant neoplasm of breast: Secondary | ICD-10-CM | POA: Diagnosis not present

## 2020-05-29 DIAGNOSIS — S43111A Subluxation of right acromioclavicular joint, initial encounter: Secondary | ICD-10-CM | POA: Insufficient documentation

## 2020-05-29 DIAGNOSIS — Z79899 Other long term (current) drug therapy: Secondary | ICD-10-CM | POA: Diagnosis not present

## 2020-05-29 DIAGNOSIS — W2209XA Striking against other stationary object, initial encounter: Secondary | ICD-10-CM | POA: Insufficient documentation

## 2020-05-29 DIAGNOSIS — M439 Deforming dorsopathy, unspecified: Secondary | ICD-10-CM | POA: Diagnosis not present

## 2020-05-29 DIAGNOSIS — M25511 Pain in right shoulder: Secondary | ICD-10-CM

## 2020-05-29 DIAGNOSIS — S4991XA Unspecified injury of right shoulder and upper arm, initial encounter: Secondary | ICD-10-CM | POA: Diagnosis present

## 2020-05-29 DIAGNOSIS — Z87891 Personal history of nicotine dependence: Secondary | ICD-10-CM | POA: Insufficient documentation

## 2020-05-29 DIAGNOSIS — Y9301 Activity, walking, marching and hiking: Secondary | ICD-10-CM | POA: Diagnosis not present

## 2020-05-29 DIAGNOSIS — S43004A Unspecified dislocation of right shoulder joint, initial encounter: Secondary | ICD-10-CM | POA: Diagnosis not present

## 2020-05-29 DIAGNOSIS — S4351XA Sprain of right acromioclavicular joint, initial encounter: Secondary | ICD-10-CM | POA: Diagnosis not present

## 2020-05-29 MED ORDER — LIDOCAINE-EPINEPHRINE 2 %-1:100000 IJ SOLN: 20.0000 mL | Freq: Once | INTRAMUSCULAR | Status: DC

## 2020-05-29 MED ORDER — METHOCARBAMOL 500 MG PO TABS
500.0000 mg | ORAL_TABLET | Freq: Once | ORAL | Status: AC
  Administered 2020-05-29: 500 mg via ORAL
  Filled 2020-05-29: qty 1

## 2020-05-29 MED ORDER — METHOCARBAMOL 500 MG PO TABS: 500.0000 mg | ORAL_TABLET | Freq: Four times a day (QID) | ORAL | 0 refills | Status: DC | PRN

## 2020-05-29 MED ORDER — ACETAMINOPHEN 500 MG PO TABS
1000.0000 mg | ORAL_TABLET | Freq: Once | ORAL | Status: AC
  Administered 2020-05-29: 1000 mg via ORAL

## 2020-05-29 MED ORDER — MORPHINE SULFATE (PF) 4 MG/ML IV SOLN: 4.0000 mg | Freq: Once | INTRAVENOUS | Status: DC

## 2020-05-29 NOTE — ED Provider Notes (Signed)
Patient lives at  [facility] Emergency Department Provider Note ____________________________________________   Event Date/Time   First MD Initiated Contact with Patient 05/29/20 1334     (approximate)  I have reviewed the triage vital signs and the nursing notes.  HISTORY  Chief Complaint Shoulder Injury   HPI Christy Braun is a 75 y.o. femalewho presents to the ED for evaluation of shoulder injury.   Chart review indicates patient is currently being treated for a UTI.  She followed up with her PCP this morning due to right shoulder pain and had an x-ray demonstrating dislocation. History of HTN, HLD and hypothyroidism.  Patient lives at home alone and handles all of her own ADLs.  Ambulates independently.  Daughter lives nearby, and presents with the patient to provide additional history.  Patient reports atraumatic pain to her right shoulder starting yesterday.  She seems to indicate some uncertainty and hesitancy when providing history.  She denies any falls or syncopal episodes.  She reports that she may have accidentally hit it yesterday while walking through a doorway.  She is reporting 4/10 intensity right shoulder pain, aching in nature and constant.  Denies additional pain.  Daughter, at the bedside, reports that she seems fine otherwise.   Past Medical History:  Diagnosis Date  . Breast cancer ([place])   . Complication of anesthesia   . Hyperlipidemia   . Hypertension   . Hypothyroidism   . Personal history of radiation therapy   . PONV (postoperative nausea and vomiting)     Patient Active Problem List   Diagnosis Date Noted  . Invasive carcinoma of breast ([place]) 12/08/2017  . Vitamin D deficiency 12/05/2017  . Hyperlipidemia 12/05/2017  . Hypertension 12/05/2017  . Fibrocystic breast disease 12/05/2017  . Allergic rhinitis 12/05/2017  . Hepatic steatosis 10/07/2017  . Recurrent major depressive disorder, in full remission ([place])  08/26/2017  . Hypothyroidism due to acquired atrophy of thyroid 03/06/2016  . History of osteoarthritis 01/24/2014  . Dyspepsia 01/24/2014    Past Surgical History:  Procedure Laterality Date  . BACK SURGERY     lumbar ruptured disc  . BREAST BIOPSY Left 9/5/20019   u/s core, Invasive mammary carcinoma W/ Mucous features  . BREAST LUMPECTOMY Left 12/09/2017   NL with SN  . FOOT SURGERY Bilateral    x 3 bunionectomy  . PARTIAL MASTECTOMY WITH NEEDLE LOCALIZATION AND AXILLARY SENTINEL LYMPH NODE BX Left 12/09/2017   Procedure: PARTIAL MASTECTOMY WITH NEEDLE LOCALIZATION AND AXILLARY SENTINEL LYMPH NODE BX;  Surgeon: Benjamine Sprague, DO;  Location: [facility] ORS;  Service: General;  Laterality: Left;    Prior to Admission medications   Medication Sig Start Date End Date Taking? Authorizing Provider  methocarbamol (ROBAXIN) 500 MG tablet Take 1 tablet (500 mg total) by mouth every 6 (six) hours as needed for muscle spasms (breakthrough pain). 05/29/20  Yes Vladimir Crofts, MD  atorvastatin (LIPITOR) 80 MG tablet Take 80 mg by mouth daily. 04/07/14   [provider]  Cholecalciferol (VITAMIN D3) 1000 units CAPS Take 1,000 Units by mouth 2 (two) times daily.    [provider]  citalopram (CELEXA) 20 MG tablet Take 20 mg by mouth daily. 12/16/14 07/31/18  [provider]  ibuprofen (ADVIL,MOTRIN) 800 MG tablet Take 1 tablet (800 mg total) by mouth every 8 (eight) hours as needed for mild pain or moderate pain. 12/09/17   [name], Isami, DO  lansoprazole (PREVACID) 30 MG capsule Take 1 capsule (30 mg total) by  mouth daily at 12 noon. 01/09/18   Chrystal, Eulas Post, MD  latanoprost (XALATAN) 0.005 % ophthalmic solution Place 1 drop into both eyes at bedtime.    [provider]  letrozole [facility]) 2.5 MG tablet TAKE ONE TABLET BY MOUTH EVERY DAY 09/21/18   Sindy Guadeloupe, MD  levothyroxine (SYNTHROID, LEVOTHROID) 50 MCG tablet Take 50 mcg by mouth daily before breakfast. 02/11/17  07/31/18  [provider]  losartan-hydrochlorothiazide (HYZAAR) 50-12.5 MG tablet Take 1 tablet by mouth daily. 03/04/17 07/31/18  [provider]  Multiple Vitamin (MULTIVITAMIN WITH MINERALS) TABS tablet Take 1 tablet by mouth daily.    [provider]  ondansetron (ZOFRAN) 4 MG tablet TAKE 1 TABLET BY MOUTH EVERY 8 HOURS AS NEEDED FOR NAUSEA OR VOMITING Patient not taking: Reported on 07/31/2018 07/10/18   Sindy Guadeloupe, MD  promethazine (PHENERGAN) 25 MG tablet Take 1 tablet (25 mg total) by mouth every 6 (six) hours as needed for nausea or vomiting. Patient not taking: Reported on 07/31/2018 01/09/18   Noreene Filbert, MD  tamoxifen (NOLVADEX) 20 MG tablet TAKE 1 TABLET BY MOUTH DAILY 02/15/19   Sindy Guadeloupe, MD    Allergies Penicillins and Oxycodone-acetaminophen  Family History  Problem Relation Age of Onset  . Ovarian cancer Mother   . Heart disease Father   . Diabetes Father   . Breast cancer Neg Hx     Social History Social History   Tobacco Use  . Smoking status: Former Smoker    Types: Cigarettes    Quit date: 12/08/1968    Years since quitting: 51.5  . Smokeless tobacco: Never Used  . Tobacco comment: quit smoking over 50 yrs ago  Vaping Use  . Vaping Use: Never used  Substance Use Topics  . Alcohol use: Yes    Comment: rare just glass  . Drug use: Never    Review of Systems  Constitutional: No fever/chills Eyes: No visual changes. ENT: No sore throat. Cardiovascular: Denies chest pain. Respiratory: Denies shortness of breath. Gastrointestinal: No abdominal pain.  No nausea, no vomiting.  No diarrhea.  No constipation. Genitourinary: Negative for dysuria. Musculoskeletal: Positive for right shoulder pain. Skin: Negative for rash. Neurological: Negative for headaches, focal weakness or numbness.  ____________________________________________   PHYSICAL EXAM:  VITAL SIGNS: Vitals:   05/29/20 1249  BP: 131/76  Pulse: (!) 109   Temp: 98.2 F (36.8 C)  SpO2: 97%     Constitutional: Alert and oriented to person, place and situation.  Disoriented to the year.. Well appearing and in no acute distress.  Helps me take her sweater off so I can examine her shoulder upon presentation. Eyes: Conjunctivae are normal. PERRL. EOMI. Head: Atraumatic. Nose: No congestion/rhinnorhea. Mouth/Throat: Mucous membranes are moist.  Oropharynx non-erythematous. Neck: No stridor. No cervical spine tenderness to palpation. Cardiovascular: Normal rate, regular rhythm. Grossly normal heart sounds.  Good peripheral circulation. Respiratory: Normal respiratory effort.  No retractions. Lungs CTAB. Gastrointestinal: Soft , nondistended, nontender to palpation. No CVA tenderness. Musculoskeletal: Bruising superiorly over her AC joint on the right, tender to palpation.  No laceration or evidence of open injury.  Right arm is distally neurovascularly intact. Full palpation of all 4 extremities otherwise without evidence of deformity, tenderness or acute trauma. Neurologic:  Normal speech and language. No gross focal neurologic deficits are appreciated. No gait instability noted. Cranial nerves II through XII intact 5/5 strength and sensation in all 4 extremities Skin:  Skin is warm, dry and intact. No rash  noted. Psychiatric: Mood and affect are normal. Speech and behavior are normal.  ____________________________________________  RADIOLOGY  ED MD interpretation: Plain film of the right shoulder reviewed by me with [facility] joint separation, without evidence of shoulder dislocation or fracture.  Official radiology report(s): DG Shoulder Right Portable  Result Date: 05/29/2020 CLINICAL DATA:  Evaluate dislocation. Right shoulder pain. Tenderness to touch. EXAM: PORTABLE RIGHT SHOULDER COMPARISON:  None. FINDINGS: The right clavicle is elevated relative to the acromion consistent with [facility] joint separation. No additional fracture or dislocation.  Visualized right lung and ribs are unremarkable. Irregularity of the greater tuberosity indicative of rotator cuff tendinopathy. Mild spurring of the glenohumeral joint. IMPRESSION: Right acromioclavicular joint separation with distal clavicle located 1.8 cm above the acromion. Electronically Signed   By: Miachel Roux M.D.   On: 05/29/2020 14:05    ____________________________________________   PROCEDURES and INTERVENTIONS  Procedure(s) performed (including Critical Care):  Procedures  Medications  acetaminophen (TYLENOL) tablet 1,000 mg (1,000 mg Oral Given 05/29/20 1503)  methocarbamol (ROBAXIN) tablet 500 mg (500 mg Oral Given 05/29/20 1503)    ____________________________________________   MDM / ED COURSE   75 year old woman presents from home with her daughter due to right shoulder pain, with evidence of AC joint separation, amenable to outpatient management orthopedics follow-up.  Hemodynamically stable.  Exam demonstrates a well-appearing woman who has no neurovascular deficits, distress.  She does have bruising to her right shoulder superiorly over her AC joint, but clinically does not have a dislocated shoulder.  Plain film demonstrates AC joint separation without fracture or shoulder dislocation.  She otherwise looks well without evidence of trauma or pathology to warrant additional imaging.  Her pain is controlled with Tylenol and Robaxin.  I discussed the case with orthopedic surgery, who agrees with outpatient management and close follow-up.  We provide her with a sling and swath, and prescription for Robaxin as an outpatient.  Return precautions for the ED were discussed.  Patient stable for outpatient management.  Clinical Course as of 05/29/20 1511  Mon May 29, 2020  1419 Ortho paged [DS]  1440 I discussed the case with Dr. Roland Rack, he reviews the plain film images.  He recommends Ortho follow-up in 7-10 days. [DS]    Clinical Course User Index [DS] Vladimir Crofts, MD     ____________________________________________   FINAL CLINICAL IMPRESSION(S) / ED DIAGNOSES  Final diagnoses:  Acute pain of right shoulder  Acromioclavicular (AC) joint injury, right, initial encounter  Acromioclavicular joint separation, right, initial encounter     ED Discharge Orders         Ordered    methocarbamol (ROBAXIN) 500 MG tablet  Every 6 hours PRN        05/29/20 1500           Judeth Gilles   Note:  This document was prepared using Systems analyst and may include unintentional dictation errors.   Vladimir Crofts, MD 05/29/20 [id number]

## 2020-05-29 NOTE — ED Triage Notes (Signed)
Pt sent from [facility] with c/o right shoulder pain, MD reported xray shows a shoulder dislocation.. pt states "it just started hurting" yesterday, denies a fall or any other mechanical movement that could have caused it.

## 2020-05-29 NOTE — Discharge Instructions (Signed)
Use Tylenol for pain and fevers.  Up to 1000 mg per dose, up to 4 times per day.  Do not take more than 4000 mg of Tylenol/acetaminophen within 24 hours..  Please keep your right arm in the sling provided until you can follow-up with orthopedic surgery.  I have also attached some rehabilitation exercises that you can slowly start to do.  The rehab is more important starting next week after he has had some time for your inflammation to decrease.  Please follow-up with orthopedic surgeons in the clinic.  I have attached their phone number and information.  If you develop any unbearable pain, fevers or inability to use/feel your right arm, please return to the ED.

## 2020-06-01 ENCOUNTER — Other Ambulatory Visit (HOSPITAL_COMMUNITY): Payer: Self-pay | Admitting: Neurology

## 2020-06-01 ENCOUNTER — Other Ambulatory Visit: Payer: Self-pay | Admitting: Neurology

## 2020-06-01 DIAGNOSIS — E538 Deficiency of other specified B group vitamins: Secondary | ICD-10-CM | POA: Diagnosis not present

## 2020-06-01 DIAGNOSIS — E519 Thiamine deficiency, unspecified: Secondary | ICD-10-CM | POA: Diagnosis not present

## 2020-06-01 DIAGNOSIS — F03A0 Unspecified dementia, mild, without behavioral disturbance, psychotic disturbance, mood disturbance, and anxiety: Secondary | ICD-10-CM

## 2020-06-01 DIAGNOSIS — F0390 Unspecified dementia without behavioral disturbance: Secondary | ICD-10-CM | POA: Diagnosis not present

## 2020-06-14 ENCOUNTER — Ambulatory Visit (HOSPITAL_COMMUNITY): Admission: RE | Admit: 2020-06-14 | Payer: PPO | Source: Ambulatory Visit

## 2020-06-14 ENCOUNTER — Encounter (HOSPITAL_COMMUNITY): Payer: Self-pay

## 2020-06-22 ENCOUNTER — Ambulatory Visit
Admission: RE | Admit: 2020-06-22 | Discharge: 2020-06-22 | Disposition: A | Discharge status: Home / Self Care | Payer: PPO | Source: Ambulatory Visit | Attending: Internal Medicine | Admitting: Internal Medicine

## 2020-06-22 ENCOUNTER — Other Ambulatory Visit: Payer: Self-pay

## 2020-06-22 DIAGNOSIS — Z1231 Encounter for screening mammogram for malignant neoplasm of breast: Secondary | ICD-10-CM | POA: Diagnosis not present

## 2020-07-05 DIAGNOSIS — S43101A Unspecified dislocation of right acromioclavicular joint, initial encounter: Secondary | ICD-10-CM | POA: Diagnosis not present

## 2020-09-07 DIAGNOSIS — H40153 Residual stage of open-angle glaucoma, bilateral: Secondary | ICD-10-CM | POA: Diagnosis not present

## 2020-09-22 DIAGNOSIS — E7849 Other hyperlipidemia: Secondary | ICD-10-CM | POA: Diagnosis not present

## 2020-09-22 DIAGNOSIS — E034 Atrophy of thyroid (acquired): Secondary | ICD-10-CM | POA: Diagnosis not present

## 2020-09-22 DIAGNOSIS — C50412 Malignant neoplasm of upper-outer quadrant of left female breast: Secondary | ICD-10-CM | POA: Diagnosis not present

## 2020-09-22 DIAGNOSIS — Z170 Estrogen receptor positive status [ER+]: Secondary | ICD-10-CM | POA: Diagnosis not present

## 2020-09-22 DIAGNOSIS — R1013 Epigastric pain: Secondary | ICD-10-CM | POA: Diagnosis not present

## 2020-09-22 DIAGNOSIS — I10 Essential (primary) hypertension: Secondary | ICD-10-CM | POA: Diagnosis not present

## 2020-09-22 DIAGNOSIS — E559 Vitamin D deficiency, unspecified: Secondary | ICD-10-CM | POA: Diagnosis not present

## 2020-11-16 DIAGNOSIS — D2261 Melanocytic nevi of right upper limb, including shoulder: Secondary | ICD-10-CM | POA: Diagnosis not present

## 2020-11-16 DIAGNOSIS — D2262 Melanocytic nevi of left upper limb, including shoulder: Secondary | ICD-10-CM | POA: Diagnosis not present

## 2020-11-16 DIAGNOSIS — D225 Melanocytic nevi of trunk: Secondary | ICD-10-CM | POA: Diagnosis not present

## 2020-11-16 DIAGNOSIS — D2272 Melanocytic nevi of left lower limb, including hip: Secondary | ICD-10-CM | POA: Diagnosis not present

## 2020-11-16 DIAGNOSIS — D2271 Melanocytic nevi of right lower limb, including hip: Secondary | ICD-10-CM | POA: Diagnosis not present

## 2020-11-16 DIAGNOSIS — L988 Other specified disorders of the skin and subcutaneous tissue: Secondary | ICD-10-CM | POA: Diagnosis not present

## 2020-11-16 DIAGNOSIS — L821 Other seborrheic keratosis: Secondary | ICD-10-CM | POA: Diagnosis not present

## 2021-01-30 DIAGNOSIS — H40153 Residual stage of open-angle glaucoma, bilateral: Secondary | ICD-10-CM | POA: Diagnosis not present

## 2021-02-21 DIAGNOSIS — R2231 Localized swelling, mass and lump, right upper limb: Secondary | ICD-10-CM | POA: Diagnosis not present

## 2021-03-09 DIAGNOSIS — M79601 Pain in right arm: Secondary | ICD-10-CM | POA: Diagnosis not present

## 2021-03-09 DIAGNOSIS — M7989 Other specified soft tissue disorders: Secondary | ICD-10-CM | POA: Diagnosis not present

## 2021-05-31 DIAGNOSIS — H40153 Residual stage of open-angle glaucoma, bilateral: Secondary | ICD-10-CM | POA: Diagnosis not present

## 2021-06-08 ENCOUNTER — Other Ambulatory Visit: Payer: Self-pay

## 2021-06-08 ENCOUNTER — Emergency Department: Payer: PPO

## 2021-06-08 ENCOUNTER — Inpatient Hospital Stay
Admission: EM | Admit: 2021-06-08 | Discharge: 2021-06-12 | DRG: 554 | Disposition: A | Discharge status: Skilled Nursing Facility | Payer: PPO | Attending: Internal Medicine | Admitting: Internal Medicine

## 2021-06-08 DIAGNOSIS — M109 Gout, unspecified: Secondary | ICD-10-CM | POA: Diagnosis present

## 2021-06-08 DIAGNOSIS — F0393 Unspecified dementia, unspecified severity, with mood disturbance: Secondary | ICD-10-CM

## 2021-06-08 DIAGNOSIS — L89621 Pressure ulcer of left heel, stage 1: Secondary | ICD-10-CM | POA: Diagnosis not present

## 2021-06-08 DIAGNOSIS — Z8041 Family history of malignant neoplasm of ovary: Secondary | ICD-10-CM

## 2021-06-08 DIAGNOSIS — C50919 Malignant neoplasm of unspecified site of unspecified female breast: Secondary | ICD-10-CM | POA: Diagnosis not present

## 2021-06-08 DIAGNOSIS — R531 Weakness: Principal | ICD-10-CM

## 2021-06-08 DIAGNOSIS — R946 Abnormal results of thyroid function studies: Secondary | ICD-10-CM | POA: Diagnosis present

## 2021-06-08 DIAGNOSIS — E785 Hyperlipidemia, unspecified: Secondary | ICD-10-CM | POA: Diagnosis not present

## 2021-06-08 DIAGNOSIS — R5383 Other fatigue: Secondary | ICD-10-CM | POA: Diagnosis not present

## 2021-06-08 DIAGNOSIS — F32A Depression, unspecified: Secondary | ICD-10-CM | POA: Diagnosis present

## 2021-06-08 DIAGNOSIS — Z8249 Family history of ischemic heart disease and other diseases of the circulatory system: Secondary | ICD-10-CM

## 2021-06-08 DIAGNOSIS — L89611 Pressure ulcer of right heel, stage 1: Secondary | ICD-10-CM | POA: Diagnosis present

## 2021-06-08 DIAGNOSIS — E538 Deficiency of other specified B group vitamins: Secondary | ICD-10-CM | POA: Diagnosis not present

## 2021-06-08 DIAGNOSIS — L89891 Pressure ulcer of other site, stage 1: Secondary | ICD-10-CM | POA: Diagnosis not present

## 2021-06-08 DIAGNOSIS — Z8739 Personal history of other diseases of the musculoskeletal system and connective tissue: Secondary | ICD-10-CM | POA: Diagnosis not present

## 2021-06-08 DIAGNOSIS — M25561 Pain in right knee: Secondary | ICD-10-CM | POA: Diagnosis present

## 2021-06-08 DIAGNOSIS — M6259 Muscle wasting and atrophy, not elsewhere classified, multiple sites: Secondary | ICD-10-CM | POA: Diagnosis not present

## 2021-06-08 DIAGNOSIS — R2681 Unsteadiness on feet: Secondary | ICD-10-CM | POA: Diagnosis not present

## 2021-06-08 DIAGNOSIS — M21372 Foot drop, left foot: Secondary | ICD-10-CM | POA: Diagnosis not present

## 2021-06-08 DIAGNOSIS — Z853 Personal history of malignant neoplasm of breast: Secondary | ICD-10-CM | POA: Diagnosis not present

## 2021-06-08 DIAGNOSIS — Z87891 Personal history of nicotine dependence: Secondary | ICD-10-CM | POA: Diagnosis not present

## 2021-06-08 DIAGNOSIS — D649 Anemia, unspecified: Secondary | ICD-10-CM | POA: Diagnosis not present

## 2021-06-08 DIAGNOSIS — E039 Hypothyroidism, unspecified: Secondary | ICD-10-CM | POA: Diagnosis present

## 2021-06-08 DIAGNOSIS — F0390 Unspecified dementia without behavioral disturbance: Secondary | ICD-10-CM | POA: Diagnosis not present

## 2021-06-08 DIAGNOSIS — L8990 Pressure ulcer of unspecified site, unspecified stage: Secondary | ICD-10-CM | POA: Insufficient documentation

## 2021-06-08 DIAGNOSIS — Z923 Personal history of irradiation: Secondary | ICD-10-CM

## 2021-06-08 DIAGNOSIS — T502X5A Adverse effect of carbonic-anhydrase inhibitors, benzothiadiazides and other diuretics, initial encounter: Secondary | ICD-10-CM | POA: Diagnosis present

## 2021-06-08 DIAGNOSIS — M1711 Unilateral primary osteoarthritis, right knee: Secondary | ICD-10-CM | POA: Diagnosis present

## 2021-06-08 DIAGNOSIS — R52 Pain, unspecified: Secondary | ICD-10-CM | POA: Diagnosis not present

## 2021-06-08 DIAGNOSIS — M810 Age-related osteoporosis without current pathological fracture: Secondary | ICD-10-CM | POA: Diagnosis not present

## 2021-06-08 DIAGNOSIS — Z736 Limitation of activities due to disability: Secondary | ICD-10-CM | POA: Diagnosis not present

## 2021-06-08 DIAGNOSIS — E871 Hypo-osmolality and hyponatremia: Secondary | ICD-10-CM | POA: Diagnosis present

## 2021-06-08 DIAGNOSIS — E876 Hypokalemia: Secondary | ICD-10-CM | POA: Diagnosis present

## 2021-06-08 DIAGNOSIS — I10 Essential (primary) hypertension: Secondary | ICD-10-CM | POA: Diagnosis present

## 2021-06-08 DIAGNOSIS — R41841 Cognitive communication deficit: Secondary | ICD-10-CM | POA: Diagnosis not present

## 2021-06-08 DIAGNOSIS — Z9012 Acquired absence of left breast and nipple: Secondary | ICD-10-CM | POA: Diagnosis not present

## 2021-06-08 DIAGNOSIS — M11261 Other chondrocalcinosis, right knee: Principal | ICD-10-CM | POA: Diagnosis present

## 2021-06-08 DIAGNOSIS — M7121 Synovial cyst of popliteal space [Baker], right knee: Secondary | ICD-10-CM | POA: Diagnosis not present

## 2021-06-08 DIAGNOSIS — F419 Anxiety disorder, unspecified: Secondary | ICD-10-CM | POA: Diagnosis not present

## 2021-06-08 DIAGNOSIS — Z833 Family history of diabetes mellitus: Secondary | ICD-10-CM

## 2021-06-08 DIAGNOSIS — W19XXXA Unspecified fall, initial encounter: Secondary | ICD-10-CM | POA: Diagnosis present

## 2021-06-08 DIAGNOSIS — R4182 Altered mental status, unspecified: Secondary | ICD-10-CM | POA: Diagnosis not present

## 2021-06-08 DIAGNOSIS — W19XXXD Unspecified fall, subsequent encounter: Secondary | ICD-10-CM | POA: Diagnosis not present

## 2021-06-08 DIAGNOSIS — R296 Repeated falls: Secondary | ICD-10-CM | POA: Diagnosis present

## 2021-06-08 DIAGNOSIS — M25461 Effusion, right knee: Secondary | ICD-10-CM | POA: Diagnosis not present

## 2021-06-08 DIAGNOSIS — S8990XA Unspecified injury of unspecified lower leg, initial encounter: Secondary | ICD-10-CM

## 2021-06-08 LAB — COMPREHENSIVE METABOLIC PANEL
ALT: 22 U/L (ref 0–44)
AST: 29 U/L (ref 15–41)
Albumin: 4.1 g/dL (ref 3.5–5.0)
Alkaline Phosphatase: 66 U/L (ref 38–126)
Anion gap: 10 (ref 5–15)
BUN: 16 mg/dL (ref 8–23)
CO2: 25 mmol/L (ref 22–32)
Calcium: 8.7 mg/dL — ABNORMAL LOW (ref 8.9–10.3)
Chloride: 98 mmol/L (ref 98–111)
Creatinine, Ser: 0.52 mg/dL (ref 0.44–1.00)
GFR, Estimated: >60 mL/min (ref >60)
Glucose, Bld: 125 mg/dL — ABNORMAL HIGH (ref 70–99)
Potassium: 3.3 mmol/L — ABNORMAL LOW (ref 3.5–5.1)
Sodium: 133 mmol/L — ABNORMAL LOW (ref 135–145)
Total Bilirubin: 1 mg/dL (ref 0.3–1.2)
Total Protein: 7.4 g/dL (ref 6.5–8.1)

## 2021-06-08 LAB — RAPID HIV SCREEN (HIV 1/2 AB+AG)
HIV 1/2 Antibodies: NONREACTIVE
HIV-1 P24 Antigen - HIV24: NONREACTIVE

## 2021-06-08 LAB — URINALYSIS, COMPLETE (UACMP) WITH MICROSCOPIC
Bacteria, UA: NONE SEEN
Bilirubin Urine: NEGATIVE
Glucose, UA: NEGATIVE mg/dL
Ketones, ur: NEGATIVE mg/dL
Leukocytes,Ua: NEGATIVE
Nitrite: NEGATIVE
Protein, ur: NEGATIVE mg/dL
Specific Gravity, Urine: 1.021 (ref 1.005–1.030)
Squamous Epithelial / HPF: NONE SEEN (ref 0–5)
pH: 6 (ref 5.0–8.0)

## 2021-06-08 LAB — CBC
HCT: 35.8 % — ABNORMAL LOW (ref 36.0–46.0)
Hemoglobin: 12.2 g/dL (ref 12.0–15.0)
MCH: 30.7 pg (ref 26.0–34.0)
MCHC: 34.1 g/dL (ref 30.0–36.0)
MCV: 89.9 fL (ref 80.0–100.0)
Platelets: 250 10*3/uL (ref 150–400)
RBC: 3.98 MIL/uL (ref 3.87–5.11)
RDW: 15.5 % (ref 11.5–15.5)
WBC: 10.9 10*3/uL — ABNORMAL HIGH (ref 4.0–10.5)
nRBC: 0 % (ref 0.0–0.2)

## 2021-06-08 LAB — MAGNESIUM: Magnesium: 1.7 mg/dL (ref 1.7–2.4)

## 2021-06-08 LAB — LACTIC ACID, PLASMA: Lactic Acid, Venous: 1.5 mmol/L (ref 0.5–1.9)

## 2021-06-08 LAB — FOLATE: Folate: 6 ng/mL (ref >5.9)

## 2021-06-08 LAB — VITAMIN B12: Vitamin B-12: 150 pg/mL — ABNORMAL LOW (ref 180–914)

## 2021-06-08 LAB — CK: Total CK: 145 U/L (ref 38–234)

## 2021-06-08 LAB — TSH: TSH: 6.51 u[IU]/mL — ABNORMAL HIGH (ref 0.350–4.500)

## 2021-06-08 MED ORDER — LOSARTAN POTASSIUM-HCTZ 50-12.5 MG PO TABS: 1.0000 | ORAL_TABLET | Freq: Every day | ORAL | Status: DC

## 2021-06-08 MED ORDER — DONEPEZIL HCL 5 MG PO TABS
5.0000 mg | ORAL_TABLET | Freq: Every day | ORAL | Status: DC
  Administered 2021-06-08 – 2021-06-11 (×4): 5 mg via ORAL
  Filled 2021-06-08 (×4): qty 1

## 2021-06-08 MED ORDER — LORAZEPAM 2 MG PO TABS: 0.0000 mg | ORAL_TABLET | Freq: Two times a day (BID) | ORAL | Status: DC

## 2021-06-08 MED ORDER — TRAMADOL HCL 50 MG PO TABS
50.0000 mg | ORAL_TABLET | Freq: Three times a day (TID) | ORAL | Status: DC | PRN
  Administered 2021-06-08: 50 mg via ORAL
  Filled 2021-06-08: qty 1

## 2021-06-08 MED ORDER — ATORVASTATIN CALCIUM 20 MG PO TABS
80.0000 mg | ORAL_TABLET | Freq: Every day | ORAL | Status: DC
  Administered 2021-06-08 – 2021-06-12 (×5): 80 mg via ORAL
  Filled 2021-06-08 (×5): qty 4

## 2021-06-08 MED ORDER — ONDANSETRON HCL 4 MG/2ML IJ SOLN
4.0000 mg | Freq: Four times a day (QID) | INTRAMUSCULAR | Status: DC | PRN
  Administered 2021-06-08 – 2021-06-09 (×2): 4 mg via INTRAVENOUS
  Filled 2021-06-08 (×2): qty 2

## 2021-06-08 MED ORDER — HYDROCHLOROTHIAZIDE 12.5 MG PO TABS
12.5000 mg | ORAL_TABLET | Freq: Every day | ORAL | Status: DC
  Administered 2021-06-08: 12.5 mg via ORAL
  Filled 2021-06-08: qty 1

## 2021-06-08 MED ORDER — THIAMINE HCL 100 MG/ML IJ SOLN: 100.0000 mg | Freq: Every day | INTRAMUSCULAR | Status: DC

## 2021-06-08 MED ORDER — FOLIC ACID 1 MG PO TABS
1.0000 mg | ORAL_TABLET | Freq: Every day | ORAL | Status: DC
  Administered 2021-06-08 – 2021-06-12 (×5): 1 mg via ORAL
  Filled 2021-06-08 (×5): qty 1

## 2021-06-08 MED ORDER — MORPHINE SULFATE (PF) 2 MG/ML IV SOLN
2.0000 mg | INTRAVENOUS | Status: DC | PRN
  Administered 2021-06-08: 2 mg via INTRAVENOUS
  Filled 2021-06-08: qty 1

## 2021-06-08 MED ORDER — THIAMINE HCL 100 MG PO TABS
100.0000 mg | ORAL_TABLET | Freq: Every day | ORAL | Status: DC
  Administered 2021-06-09 – 2021-06-12 (×4): 100 mg via ORAL
  Filled 2021-06-08 (×5): qty 1

## 2021-06-08 MED ORDER — ADULT MULTIVITAMIN W/MINERALS CH: 1.0000 | ORAL_TABLET | Freq: Every day | ORAL | Status: DC

## 2021-06-08 MED ORDER — ADULT MULTIVITAMIN W/MINERALS CH
1.0000 | ORAL_TABLET | Freq: Every day | ORAL | Status: DC
  Administered 2021-06-08 – 2021-06-12 (×5): 1 via ORAL
  Filled 2021-06-08 (×5): qty 1

## 2021-06-08 MED ORDER — OXYCODONE-ACETAMINOPHEN 5-325 MG PO TABS
1.0000 | ORAL_TABLET | Freq: Four times a day (QID) | ORAL | Status: DC | PRN
  Filled 2021-06-08: qty 1

## 2021-06-08 MED ORDER — CITALOPRAM HYDROBROMIDE 20 MG PO TABS
20.0000 mg | ORAL_TABLET | Freq: Every day | ORAL | Status: DC
  Administered 2021-06-08 – 2021-06-12 (×5): 20 mg via ORAL
  Filled 2021-06-08 (×5): qty 1

## 2021-06-08 MED ORDER — BUSPIRONE HCL 15 MG PO TABS
7.5000 mg | ORAL_TABLET | Freq: Two times a day (BID) | ORAL | Status: DC
  Administered 2021-06-08 – 2021-06-12 (×8): 7.5 mg via ORAL
  Filled 2021-06-08 (×8): qty 1

## 2021-06-08 MED ORDER — VITAMIN D3 25 MCG (1000 UNIT) PO TABS
1000.0000 [IU] | ORAL_TABLET | Freq: Two times a day (BID) | ORAL | Status: DC
  Administered 2021-06-08 – 2021-06-12 (×8): 1000 [IU] via ORAL
  Filled 2021-06-08 (×15): qty 1

## 2021-06-08 MED ORDER — LOSARTAN POTASSIUM 50 MG PO TABS
50.0000 mg | ORAL_TABLET | Freq: Every day | ORAL | Status: DC
  Administered 2021-06-08 – 2021-06-11 (×4): 50 mg via ORAL
  Filled 2021-06-08 (×4): qty 1

## 2021-06-08 MED ORDER — TAMOXIFEN CITRATE 10 MG PO TABS
20.0000 mg | ORAL_TABLET | Freq: Every day | ORAL | Status: DC
  Administered 2021-06-09 – 2021-06-12 (×4): 20 mg via ORAL
  Filled 2021-06-08 (×5): qty 2

## 2021-06-08 MED ORDER — POTASSIUM CHLORIDE CRYS ER 20 MEQ PO TBCR
40.0000 meq | EXTENDED_RELEASE_TABLET | Freq: Every day | ORAL | Status: DC
  Administered 2021-06-08: 40 meq via ORAL
  Filled 2021-06-08: qty 2

## 2021-06-08 MED ORDER — LORAZEPAM 2 MG/ML IJ SOLN
1.0000 mg | INTRAMUSCULAR | Status: DC | PRN
  Administered 2021-06-08: 2 mg via INTRAVENOUS
  Filled 2021-06-08: qty 1

## 2021-06-08 MED ORDER — LORAZEPAM 2 MG PO TABS: 0.0000 mg | ORAL_TABLET | Freq: Four times a day (QID) | ORAL | Status: DC

## 2021-06-08 MED ORDER — LORAZEPAM 0.5 MG PO TABS
0.5000 mg | ORAL_TABLET | Freq: Once | ORAL | Status: AC
  Administered 2021-06-08: 0.5 mg via ORAL
  Filled 2021-06-08: qty 1

## 2021-06-08 MED ORDER — LORAZEPAM 1 MG PO TABS
1.0000 mg | ORAL_TABLET | ORAL | Status: DC | PRN
  Administered 2021-06-09: 1 mg via ORAL
  Administered 2021-06-09: 2 mg via ORAL
  Administered 2021-06-10: 1 mg via ORAL
  Administered 2021-06-11: 2 mg via ORAL
  Filled 2021-06-08: qty 2
  Filled 2021-06-08 (×2): qty 1
  Filled 2021-06-08: qty 2
  Filled 2021-06-08: qty 1

## 2021-06-08 MED ORDER — LEVOTHYROXINE SODIUM 50 MCG PO TABS
50.0000 ug | ORAL_TABLET | Freq: Every day | ORAL | Status: DC
  Administered 2021-06-09 – 2021-06-12 (×4): 50 ug via ORAL
  Filled 2021-06-08 (×4): qty 1

## 2021-06-08 MED ORDER — ONDANSETRON HCL 4 MG PO TABS: 4.0000 mg | ORAL_TABLET | Freq: Four times a day (QID) | ORAL | Status: DC | PRN

## 2021-06-08 MED ORDER — ENOXAPARIN SODIUM 40 MG/0.4ML IJ SOSY
40.0000 mg | PREFILLED_SYRINGE | INTRAMUSCULAR | Status: DC
  Administered 2021-06-08 – 2021-06-11 (×4): 40 mg via SUBCUTANEOUS
  Filled 2021-06-08 (×4): qty 0.4

## 2021-06-08 NOTE — Assessment & Plan Note (Addendum)
Hyponatremia ? ?Secondary to diuretics.  Potassium was repleted.  Sodium level is low but stable. ?

## 2021-06-08 NOTE — ED Notes (Signed)
Informed RN bed assigned 

## 2021-06-08 NOTE — Progress Notes (Signed)
Admission profile updated. ?

## 2021-06-08 NOTE — Assessment & Plan Note (Addendum)
Holding HCTZ due to hypokalemia and hyponatremia.  Dose of ARB increased and will be continued at discharge.  HCTZ to be discontinued. ?

## 2021-06-08 NOTE — Assessment & Plan Note (Addendum)
Patient is status post fall and presents for evaluation of right knee pain and swelling as well as difficulty with ambulation. ?Apparently has been falling more than usual per daughter.  Likely due to progression of dementia.   ?Seen by PT.  Skilled nursing facility is recommended for rehabilitation.  Patient is medically stable for discharge. ?

## 2021-06-08 NOTE — ED Triage Notes (Signed)
Pt BIBA for weakness, fall yesterday and n/v/d. Pt a/ox4, +weakness, nausea, states she thinks she had fall but cannot fully remember. Denies head/neck/back pain or CP, SOB ?

## 2021-06-08 NOTE — Assessment & Plan Note (Addendum)
Patient with complaints of right knee pain and swelling following a fall and difficulty with ambulation. ?X ray shows moderate-sized joint effusion with articular surface irregularity of the posterolateral tibial plateau. ?Tricompartment osteoarthritis and/or CPPD arthropathy.  CT of the knee does not show any fractures and continues to show joint effusion. ?Seen by orthopedics and underwent arthrocentesis on 3/18.  Patient does feel better with respect to pain in her right knee. ?Synovial fluid analysis reviewed.  It was a bloody fluid with the 35,305 WBCs.  No organisms noted on Gram stain.  This is likely inflammatory.  Intracellular calcium pyrophosphate crystals were noted.  Likely pseudogout.  Cultures negative. ?Symptoms have improved.  She was given Motrin since renal function was normal.  If she has recurrence of her symptoms Motrin could be again administered.  If she has persistent symptoms despite Motrin then would either try colchicine or steroid though would hesitate to use steroids due to her dementia.   ?

## 2021-06-08 NOTE — Assessment & Plan Note (Addendum)
Pseudogout based on synovial fluid analysis ? ?See discussion under knee pain. ?

## 2021-06-08 NOTE — Assessment & Plan Note (Signed)
Status post partial mastectomy as well as radiation therapy ?Continue tamoxifen ?

## 2021-06-08 NOTE — Assessment & Plan Note (Addendum)
Noted to be on Synthroid.  TSH elevated at 6.5.  Free T4 normal at 0.9.  Continue current dose of Synthroid.  Recheck labs in 3 to 4 weeks. ?

## 2021-06-08 NOTE — Assessment & Plan Note (Addendum)
Patient has a history of underlying depression and dementia. ?Continue donepezil, Celexa and buspirone. ?Per daughter patient has been more forgetful.   ?Dementia work-up was done which showed extremely low vitamin B12 level.  See below.  Folic acid level was normal.  ?RPR was nonreactive ?TSH mildly elevated at 6.5.  Free T4 normal at 0.94. ?

## 2021-06-08 NOTE — ED Provider Notes (Signed)
? ?[facility] ?Provider Note ? ? ? Event Date/Time  ? First MD Initiated Contact with Patient 06/08/21 [phone]   ?  (approximate) ? ? ?History  ? ?Weakness (Fall, NVD/) ?History limited by altered mental status ? ?HPI ? ?Christy Braun is a 76 y.o. female with a history of hypertension, hypothyroidism who reportedly lives alone and handles all of her ADLs presents after reported fall.  Apparently the patient had a fall yesterday has been having weakness and nausea and confusion.  She complains only of right knee pain. ?  ? ? ?Physical Exam  ? ?Triage Vital Signs: ?ED Triage Vitals  ?Enc Vitals Group  ?   BP 06/08/21 0906 (!) 147/70  ?   Pulse Rate 06/08/21 0906 100  ?   Resp 06/08/21 0906 20  ?   Temp 06/08/21 0906 98 ?F (36.7 ?C)  ?   Temp Source 06/08/21 0906 Oral  ?   SpO2 06/08/21 0906 97 %  ?   Weight 06/08/21 0907 63.5 kg (140 lb)  ?   Height 06/08/21 0907 1.676 m ('5\' 6"'$ )  ?   Head Circumference --   ?   Peak Flow --   ?   Pain Score 06/08/21 0907 0  ?   Pain Loc --   ?   Pain Edu? --   ?   Excl. in [place]? --   ? ? ?Most recent vital signs: ?Vitals:  ? 06/08/21 0906 06/08/21 1235  ?BP: (!) 147/70 139/79  ?Pulse: 100 96  ?Resp: 20 18  ?Temp: 98 ?F (36.7 ?C) 98.3 ?F (36.8 ?C)  ?SpO2: 97% 100%  ? ? ? ?General: Awake, no distress.  ?CV:  Good peripheral perfusion.  No chest wall tenderness pain, no bruising, no clavicle tenderness. ?Resp:  Normal effort.  CTA B ?Abd:  No distention.  No tenderness to palpation ?Other:  No vertebral tenderness to palpation, no cervical pain with axial load.  No evidence of head trauma. ?Cranial nerves are intact, moving all extremities equally.  Patient does appear somewhat confused however. ?PERRLA, EOMI ? ?Mild tenderness to palpation to the right knee anteriorly, possible effusion, no bony abnormalities palpated ? ? ?ED Results / Procedures / Treatments  ? ?Labs ?(all labs ordered are listed, but only abnormal results are displayed) ?Labs Reviewed  ?CBC -  Abnormal; Notable for the following components:  ?    Result Value  ? WBC 10.9 (*)   ? HCT 35.8 (*)   ? All other components within normal limits  ?COMPREHENSIVE METABOLIC PANEL - Abnormal; Notable for the following components:  ? Sodium 133 (*)   ? Potassium 3.3 (*)   ? Glucose, Bld 125 (*)   ? Calcium 8.7 (*)   ? All other components within normal limits  ?URINALYSIS, COMPLETE (UACMP) WITH MICROSCOPIC - Abnormal; Notable for the following components:  ? Color, Urine YELLOW (*)   ? APPearance CLEAR (*)   ? Hgb urine dipstick MODERATE (*)   ? All other components within normal limits  ?LACTIC ACID, PLASMA  ? ? ? ?EKG ? ?ED ECG REPORT ?I, Lavonia Drafts, the attending physician, personally viewed and interpreted this ECG. ? ?Date: 06/08/2021 ? ?Rhythm: Sinus tachycardia ?QRS Axis: normal ?Intervals: normal ?ST/T Wave abnormalities: normal ?Narrative Interpretation: no evidence of acute ischemia ? ? ? ?RADIOLOGY ?Knee x-ray viewed interpreted by me, no acute abnormality ? ? ? ?PROCEDURES: ? ?Critical Care performed:  ? ?Procedures ? ? ?MEDICATIONS ORDERED IN ED: ?  Medications  ?LORazepam (ATIVAN) tablet 0.5 mg (has no administration in time range)  ? ? ? ?IMPRESSION / MDM / ASSESSMENT AND PLAN / ED COURSE  ?I reviewed the triage vital signs and the nursing notes. ? ? ?Patient presents with altered mental status as detailed above, she does seem confused does not remember a fall.  Reviewing medical records demonstrates the patient seems to be quite independent typically. ? ?Concern for possible ICH, infection, electrolyte abnormality. ? ?We will obtain CT head, labs, place IV, obtain urinalysis and Monitor. ? ? ?----------------------------------------- ?1:36 PM on 06/08/2021 ?----------------------------------------- ?Daughter has arrived, discussed with her at length, she has concerns that her mother does have dementia which has been progressing over some time.  Patient's work-up is overall reassuring, no evidence of  infection however patient is very unsteady and not safe for discharge, have discussed with Dr. Francine Graven of the hospitalist for admission ? ?  ? ? ?FINAL CLINICAL IMPRESSION(S) / ED DIAGNOSES  ? ?Final diagnoses:  ?Generalized weakness  ?Knee injury, initial encounter  ? ? ? ?Rx / DC Orders  ? ?ED Discharge Orders   ? ? None  ? ?  ? ? ? ?Note:  This document was prepared using Dragon voice recognition software and may include unintentional dictation errors. ?  ?Lavonia Drafts, MD ?06/08/21 1337 ? ?

## 2021-06-08 NOTE — H&P (Signed)
?History and Physical  ? ? ?Patient: Christy Braun SFK:[id number] DOB: 07/01/1945 ?DOA: 06/08/2021 ?DOS: the patient was seen and examined on 06/08/2021 ?PCP: Adin Hector, MD  ?Patient coming from: Home ? ?Chief Complaint:  ?Chief Complaint  ?Patient presents with  ? Weakness  ?  Fall, NVD ?  ?Most of the history was obtained from patient's daughter at the bedside as she cannot fully remember the events of yesterday. ?HPI: Christy Braun is a 76 y.o. female with medical history significant for hypothyroidism, hypertension, history of breast cancer status post partial mastectomy as well as radiation therapy and dementia who was brought into the ER by EMS for evaluation of weakness and frequent falls at home. ?Per patient's daughter she has had multiple falls and usually ambulates with a rolling walker.  It is unclear if she had her walker when she fell yesterday because she lives alone.  Patient complains of pain in her right knee and has difficulty ambulating due to pain and weakness in that right leg. ?She has a known history of dementia but daughter states that she has become increasingly forgetful and is concerned about patient living alone. ?They tried to ambulate her in the ER but she was unable to ambulate due to pain in her right leg. ?She denies having any chest pain, no shortness of breath, no headache, no dizziness, no lightheadedness, no abdominal pain, no changes in her bowel habits, no fever, no chills, no cough. ?She admits to daily alcohol use but denies having symptoms of alcohol withdrawal when she does not drink ? ?Review of Systems: As mentioned in the history of present illness. All other systems reviewed and are negative. ?Past Medical History:  ?Diagnosis Date  ? Breast cancer ([place])   ? Complication of anesthesia   ? Hyperlipidemia   ? Hypertension   ? Hypothyroidism   ? Personal history of radiation therapy   ? PONV (postoperative nausea and vomiting)   ? ?Past Surgical History:   ?Procedure Laterality Date  ? BACK SURGERY    ? lumbar ruptured disc  ? BREAST BIOPSY Left 9/5/20019  ? u/s core, Invasive mammary carcinoma W/ Mucous features  ? BREAST LUMPECTOMY Left 12/09/2017  ? NL with SN  ? FOOT SURGERY Bilateral   ? x 3 bunionectomy  ? PARTIAL MASTECTOMY WITH NEEDLE LOCALIZATION AND AXILLARY SENTINEL LYMPH NODE BX Left 12/09/2017  ? Procedure: PARTIAL MASTECTOMY WITH NEEDLE LOCALIZATION AND AXILLARY SENTINEL LYMPH NODE BX;  Surgeon: Benjamine Sprague, DO;  Location: [facility] ORS;  Service: General;  Laterality: Left;  ? ?Social History:  reports that she quit smoking about 52 years ago. Her smoking use included cigarettes. She has never used smokeless tobacco. She reports current alcohol use. She reports that she does not use drugs. ? ?Allergies  ?Allergen Reactions  ? Penicillins Hives, Swelling and Other (See Comments)  ?  "knots" on legs ?Has patient had a PCN reaction causing immediate rash, facial/tongue/throat swelling, SOB or lightheadedness with hypotension: No ?Has patient had a PCN reaction causing severe rash involving mucus membranes or skin necrosis: No ?Has patient had a PCN reaction that required hospitalization: No ?Has patient had a PCN reaction occurring within the last 10 years: No ?If all of the above answers are "NO", then may proceed with Cephalosporin use. ? ?  ? Oxycodone-Acetaminophen Anxiety and Other (See Comments)  ?  confusion  ? ? ?Family History  ?Problem Relation Age of Onset  ? Ovarian cancer Mother   ?  Heart disease Father   ? Diabetes Father   ? Breast cancer Neg Hx   ? ? ?Prior to Admission medications   ?Medication Sig Start Date End Date Taking? Authorizing Provider  ?atorvastatin (LIPITOR) 80 MG tablet Take 80 mg by mouth daily. 04/07/14  Yes [provider]  ?busPIRone (BUSPAR) 7.5 MG tablet Take 7.5 mg by mouth 2 (two) times daily. 01/15/21  Yes [provider]  ?Cholecalciferol (VITAMIN D3) 1000 units CAPS Take 1,000 Units by mouth 2 (two)  times daily.   Yes [provider]  ?donepezil (ARICEPT) 5 MG tablet Take 5 mg by mouth at bedtime. 12/18/20  Yes [provider]  ?ibuprofen (ADVIL,MOTRIN) 800 MG tablet Take 1 tablet (800 mg total) by mouth every 8 (eight) hours as needed for mild pain or moderate pain. 12/09/17  Yes Sakai, Isami, DO  ?Multiple Vitamin (MULTIVITAMIN WITH MINERALS) TABS tablet Take 1 tablet by mouth daily.   Yes [provider]  ?tamoxifen (NOLVADEX) 20 MG tablet TAKE 1 TABLET BY MOUTH DAILY 02/15/19  Yes Sindy Guadeloupe, MD  ?citalopram (CELEXA) 20 MG tablet Take 20 mg by mouth daily. 12/16/14 07/31/18  [provider]  ?lansoprazole (PREVACID) 30 MG capsule Take 1 capsule (30 mg total) by mouth daily at 12 noon. ?Patient not taking: Reported on 06/08/2021 01/09/18   Noreene Filbert, MD  ?latanoprost (XALATAN) 0.005 % ophthalmic solution Place 1 drop into both eyes at bedtime. ?Patient not taking: Reported on 06/08/2021    [provider]  ?letrozole ([place]) 2.5 MG tablet TAKE ONE TABLET BY MOUTH EVERY DAY ?Patient not taking: Reported on 06/08/2021 09/21/18   Sindy Guadeloupe, MD  ?levothyroxine (SYNTHROID, LEVOTHROID) 50 MCG tablet Take 50 mcg by mouth daily before breakfast. 02/11/17 07/31/18  [provider]  ?losartan-hydrochlorothiazide (HYZAAR) 50-12.5 MG tablet Take 1 tablet by mouth daily. 03/04/17 07/31/18  [provider]  ?methocarbamol (ROBAXIN) 500 MG tablet Take 1 tablet (500 mg total) by mouth every 6 (six) hours as needed for muscle spasms (breakthrough pain). ?Patient not taking: Reported on 06/08/2021 05/29/20   Vladimir Crofts, MD  ?mirtazapine (REMERON) 7.5 MG tablet Take 7.5 mg by mouth at bedtime. ?Patient not taking: Reported on 06/08/2021 01/15/21   [provider]  ?ondansetron (ZOFRAN) 4 MG tablet TAKE 1 TABLET BY MOUTH EVERY 8 HOURS AS NEEDED FOR NAUSEA OR VOMITING ?Patient not taking: No sig reported 07/10/18   Sindy Guadeloupe, MD  ?promethazine  (PHENERGAN) 25 MG tablet Take 1 tablet (25 mg total) by mouth every 6 (six) hours as needed for nausea or vomiting. ?Patient not taking: Reported on 07/31/2018 01/09/18   Noreene Filbert, MD  ? ? ?Physical Exam: ?Vitals:  ? 06/08/21 0906 06/08/21 0907 06/08/21 1235  ?BP: (!) 147/70  139/79  ?Pulse: 100  96  ?Resp: 20  18  ?Temp: 98 ?F (36.7 ?C)  98.3 ?F (36.8 ?C)  ?TempSrc: Oral  Oral  ?SpO2: 97%  100%  ?Weight:  63.5 kg   ?Height:  '5\' 6"'$  (1.676 m)   ? ?Physical Exam ?Vitals and nursing note reviewed.  ?HENT:  ?   Head: Normocephalic and atraumatic.  ?   Nose: Nose normal.  ?   Mouth/Throat:  ?   Mouth: Mucous membranes are moist.  ?Eyes:  ?   Pupils: Pupils are equal, round, and reactive to light.  ?Cardiovascular:  ?   Rate and Rhythm: Normal rate and regular rhythm.  ?   Pulses: Normal pulses.  ?  Pulmonary:  ?   Effort: Pulmonary effort is normal.  ?Abdominal:  ?   General: Abdomen is flat. Bowel sounds are normal.  ?   Palpations: Abdomen is soft.  ?Musculoskeletal:     ?   General: Swelling present.  ?   Cervical back: Normal range of motion and neck supple.  ?   Comments: Decreased range of motion and swelling involving the right knee  ?Skin: ?   General: Skin is warm and dry.  ?Neurological:  ?   General: No focal deficit present.  ?   Mental Status: She is alert and oriented to person, place, and time.  ?   Gait: Gait abnormal.  ?Psychiatric:     ?   Behavior: Behavior normal.  ?   Comments: Appears anxious  ? ? ?Data Reviewed: ?Relevant notes from primary care and specialist visits, past discharge summaries as available in EHR, including Care Everywhere. ?Prior diagnostic testing as pertinent to current admission diagnoses ?Updated medications and problem lists for reconciliation ?ED course, including vitals, labs, imaging, treatment and response to treatment ?Triage notes, nursing and pharmacy notes and ED provider's notes ?Notable results as noted in HPI ?Labs reviewed.  Sodium 133, potassium 3.3, glucose  125, calcium 8.7, white count 10.9, hemoglobin 12.2, hematocrit 35.8, MCV 89.9, platelet count 250 ?X-ray of the pelvis shows mild degenerative disease ?Right knee x-ray shows Moderate-sized joint effusion with articular

## 2021-06-09 DIAGNOSIS — E039 Hypothyroidism, unspecified: Secondary | ICD-10-CM

## 2021-06-09 DIAGNOSIS — D649 Anemia, unspecified: Secondary | ICD-10-CM | POA: Diagnosis not present

## 2021-06-09 DIAGNOSIS — E876 Hypokalemia: Secondary | ICD-10-CM | POA: Diagnosis not present

## 2021-06-09 DIAGNOSIS — E538 Deficiency of other specified B group vitamins: Secondary | ICD-10-CM | POA: Diagnosis present

## 2021-06-09 DIAGNOSIS — F0393 Unspecified dementia, unspecified severity, with mood disturbance: Secondary | ICD-10-CM | POA: Diagnosis not present

## 2021-06-09 DIAGNOSIS — M25561 Pain in right knee: Secondary | ICD-10-CM | POA: Diagnosis not present

## 2021-06-09 DIAGNOSIS — L8990 Pressure ulcer of unspecified site, unspecified stage: Secondary | ICD-10-CM | POA: Insufficient documentation

## 2021-06-09 LAB — BASIC METABOLIC PANEL
Anion gap: 9 (ref 5–15)
BUN: 9 mg/dL (ref 8–23)
CO2: 27 mmol/L (ref 22–32)
Calcium: 8.6 mg/dL — ABNORMAL LOW (ref 8.9–10.3)
Chloride: 95 mmol/L — ABNORMAL LOW (ref 98–111)
Creatinine, Ser: 0.55 mg/dL (ref 0.44–1.00)
GFR, Estimated: >60 mL/min (ref >60)
Glucose, Bld: 120 mg/dL — ABNORMAL HIGH (ref 70–99)
Potassium: 2.9 mmol/L — ABNORMAL LOW (ref 3.5–5.1)
Sodium: 131 mmol/L — ABNORMAL LOW (ref 135–145)

## 2021-06-09 LAB — CBC
HCT: 33.7 % — ABNORMAL LOW (ref 36.0–46.0)
Hemoglobin: 11.6 g/dL — ABNORMAL LOW (ref 12.0–15.0)
MCH: 30.7 pg (ref 26.0–34.0)
MCHC: 34.4 g/dL (ref 30.0–36.0)
MCV: 89.2 fL (ref 80.0–100.0)
Platelets: 218 10*3/uL (ref 150–400)
RBC: 3.78 MIL/uL — ABNORMAL LOW (ref 3.87–5.11)
RDW: 15.2 % (ref 11.5–15.5)
WBC: 11.7 10*3/uL — ABNORMAL HIGH (ref 4.0–10.5)
nRBC: 0 % (ref 0.0–0.2)

## 2021-06-09 LAB — SYNOVIAL CELL COUNT + DIFF, W/ CRYSTALS
Eosinophils-Synovial: 0 %
Lymphocytes-Synovial Fld: 2 %
Monocyte-Macrophage-Synovial Fluid: 7 %
Neutrophil, Synovial: 91 %
WBC, Synovial: 35305 /mm3 — ABNORMAL HIGH (ref 0–200)

## 2021-06-09 LAB — RPR: RPR Ser Ql: NONREACTIVE

## 2021-06-09 MED ORDER — POTASSIUM CHLORIDE IN NACL 20-0.9 MEQ/L-% IV SOLN
INTRAVENOUS | Status: DC
  Filled 2021-06-09 (×3): qty 1000

## 2021-06-09 MED ORDER — VITAMIN B-12 1000 MCG PO TABS: 1000.0000 ug | ORAL_TABLET | Freq: Every day | ORAL | Status: DC

## 2021-06-09 MED ORDER — CYANOCOBALAMIN 1000 MCG/ML IJ SOLN
1000.0000 ug | Freq: Every day | INTRAMUSCULAR | Status: DC
  Administered 2021-06-09 – 2021-06-12 (×4): 1000 ug via INTRAMUSCULAR
  Filled 2021-06-09 (×4): qty 1

## 2021-06-09 MED ORDER — BUPIVACAINE HCL (PF) 0.5 % IJ SOLN
10.0000 mL | Freq: Once | INTRAMUSCULAR | Status: AC
Start: 2021-06-09 — End: 2021-06-09
  Administered 2021-06-09: 10 mL via INTRA_ARTICULAR
  Filled 2021-06-09: qty 10

## 2021-06-09 MED ORDER — POTASSIUM CHLORIDE CRYS ER 20 MEQ PO TBCR
40.0000 meq | EXTENDED_RELEASE_TABLET | ORAL | Status: AC
  Administered 2021-06-09 (×2): 40 meq via ORAL
  Filled 2021-06-09 (×2): qty 2

## 2021-06-09 MED ORDER — TRIAMCINOLONE ACETONIDE 40 MG/ML IJ SUSP
80.0000 mg | Freq: Once | INTRAMUSCULAR | Status: AC
  Administered 2021-06-09: 80 mg via INTRA_ARTICULAR
  Filled 2021-06-09: qty 2

## 2021-06-09 MED ORDER — MAGNESIUM SULFATE 2 GM/50ML IV SOLN
2.0000 g | Freq: Once | INTRAVENOUS | Status: AC
  Administered 2021-06-09: 2 g via INTRAVENOUS
  Filled 2021-06-09: qty 50

## 2021-06-09 NOTE — Hospital Course (Signed)
76 y.o. female with medical history significant for hypothyroidism, hypertension, history of breast cancer status post partial mastectomy as well as radiation therapy and dementia who was brought into the ER by EMS for evaluation of weakness and frequent falls at home.  Patient was also complaining of pain in the right knee.  According to daughter patient has become increasingly forgetful and has had multiple falls at home.  She was hospitalized for further management. ?

## 2021-06-09 NOTE — Plan of Care (Signed)

## 2021-06-09 NOTE — TOC Initial Note (Addendum)
Transition of Care (TOC) - Initial/Assessment Note  ? ? ?Patient Details  ?Name: Christy Braun ?MRN: [id number] ?Date of Birth: 1946/03/21 ? ?Transition of Care (TOC) CM/SW Contact:    ?Declan Mier E Jahaan Vanwagner, LCSW ?Phone Number: ?06/09/2021, 2:15 PM ? ?Clinical Narrative:              Per chart review, patient with memory impairment.   ?Spoke to daughter Lattie Haw regarding SNF recommendation. ?Patient lives alone and was driving herself to appointments, however family may not have patient drive herself anymore. PCP is Dr. Caryl Comes. [facility]. At baseline, patient uses a rolling walker as needed. No HH or SNF history.  ?Lattie Haw is agreeable to SNF recommendation. First choice is [facility] but she understands it is based on availability and as of Friday they were full. Provided Medicare.gov Care Compare information for Lattie Haw to research other options.  ?SNF work up started. Asked Seth Bake at [facility] to review- she said she will review Monday and will know about bed availability on Monday. ? ?Expected Discharge Plan: [place] ?Barriers to Discharge: Continued Medical Work up ? ? ?Patient Goals and CMS Choice ?Patient states their goals for this hospitalization and ongoing recovery are:: SNF ?CMS Medicare.gov Compare Post Acute Care list provided to:: Patient Represenative (must comment) ?Choice offered to / list presented to : Adult Children ? ?Expected Discharge Plan and Services ?Expected Discharge Plan: [place] ?  ?  ?  ?Living arrangements for the past 2 months: [place] ?                ?  ?  ?  ?  ?  ?  ?  ?  ?  ?  ? ?Prior Living Arrangements/Services ?Living arrangements for the past 2 months: [place] ?Lives with:: Self ?Patient language and need for interpreter reviewed:: Yes ?       ?Need for Family Participation in Patient Care: Yes (Comment) ?Care giver support system in place?: Yes (comment) ?Current home services: DME ?Criminal Activity/Legal Involvement  Pertinent to Current Situation/Hospitalization: No - Comment as needed ? ?Activities of Daily Living ?Home Assistive Devices/Equipment: Gilford Rile (specify type), Grab bars around toilet, Built-in shower seat ?ADL Screening (condition at time of admission) ?Patient's cognitive ability adequate to safely complete daily activities?: Yes ?Is the patient deaf or have difficulty hearing?: No ?Does the patient have difficulty seeing, even when wearing glasses/contacts?: No ?Does the patient have difficulty concentrating, remembering, or making decisions?: No ?Patient able to express need for assistance with ADLs?: Yes ?Does the patient have difficulty dressing or bathing?: No ?Independently performs ADLs?: Yes (appropriate for developmental age) ?Does the patient have difficulty walking or climbing stairs?: Yes ?Weakness of Legs: Both ?Weakness of Arms/Hands: None ? ?Permission Sought/Granted ?Permission sought to share information with : Customer service manager ?Permission granted to share information with : Yes, Verbal Permission Granted (by daughter Lattie Haw) ?   ? Permission granted to share info w AGENCY: SNFs ?   ?   ? ?Emotional Assessment ?  ?  ?  ?  ?Alcohol / Substance Use: Not Applicable ?Psych Involvement: No (comment) ? ?Admission diagnosis:  Fall [W19.XXXA] ?Generalized weakness [R53.1] ?Knee injury, initial encounter [S89.90XA] ?Patient Active Problem List  ? Diagnosis Date Noted  ? Pressure injury of skin 06/09/2021  ? Vitamin B12 deficiency 06/09/2021  ? Normocytic anemia 06/09/2021  ? Fall 06/08/2021  ? Dementia ([place]) 06/08/2021  ? Hypothyroidism 06/08/2021  ? Right knee pain  06/08/2021  ? Hypokalemia 06/08/2021  ? Invasive carcinoma of breast ([place]) 12/08/2017  ? Vitamin D deficiency 12/05/2017  ? Hyperlipidemia 12/05/2017  ? Hypertension 12/05/2017  ? Fibrocystic breast disease 12/05/2017  ? Allergic rhinitis 12/05/2017  ? Hepatic steatosis 10/07/2017  ? History of osteoarthritis 01/24/2014  ? Dyspepsia  01/24/2014  ? ?PCP:  Adin Hector, MD ?Pharmacy:   ?[facility] - [place], [place] - [address] ?[address] ?[place] [address] ?Phone: [phone] Fax: [phone] ? ? ? ? ?Social Determinants of Health (SDOH) Interventions ?  ? ?Readmission Risk Interventions ?Readmission Risk Prevention Plan 06/09/2021  ?Transportation Screening Complete  ?PCP or Specialist Appt within 5-7 Days Complete  ?Home Care Screening Complete  ?Medication Review (RN CM) Complete  ?Some recent data might be hidden  ? ? ? ?

## 2021-06-09 NOTE — Progress Notes (Signed)
? ?[facility] ?PROGRESS NOTE ? ? ?Christy Braun:[id number] DOB: [birth year] DOA: 06/08/2021  1 ?DOS: the patient was seen and examined on 06/09/2021 ? ?PCP: Adin Hector, MD ? ?Brief History and Hospital Course:  ?76 y.o. female with medical history significant for hypothyroidism, hypertension, history of breast cancer status post partial mastectomy as well as radiation therapy and dementia who was brought into the ER by EMS for evaluation of weakness and frequent falls at home.  Patient was also complaining of pain in the right knee.  According to daughter patient has become increasingly forgetful and has had multiple falls at home.  She was hospitalized for further management. ? ?Consultants: Orthopedics ? ?Procedures: None yet ? ? ? ?Subjective: ?Patient complains of 6 out of 10 pain in the right knee.  Denies any chest pain shortness of breath.  Nausea and vomiting overnight which she thinks was due to her dinner.  Does not have any abdominal pain. ? ? ?Assessment/Plan: ? ?* Fall ?Patient is status post fall and presents for evaluation of right knee pain and swelling as well as difficulty with ambulation. ?PT and OT evaluation once cleared by orthopedics. ? ?Right knee pain ?Patient with complaints of right knee pain and swelling following a fall and difficulty with ambulation. ?X ray shows moderate-sized joint effusion with articular surface irregularity of the posterolateral tibial plateau.  Tricompartment osteoarthritis and/or CPPD arthropathy.  CT of the knee does not show any fractures and continues to show joint effusion. ?Waiting on orthopedic input.  Patient might benefit from arthrocentesis. ? ?Dementia ([place]) ?Patient has a history of underlying depression and dementia. ?Continue donepezil, Celexa and buspirone. ?Per daughter patient has been more forgetful.   ?Dementia work-up was done which showed extremely low vitamin B12 level.  See below.  Folic acid level was normal.  ?RPR was  nonreactive ?TSH mildly elevated at 6.5.  Will check free T4. ? ?Hypertension ?Hold HCTZ due to hypokalemia and hyponatremia.  Continue ARB. ? ?Hypokalemia ?Hyponatremia ? ?Secondary to diuretics.  We will replace potassium aggressively.  Magnesium is 1.7 and will be repleted. ?Continue to monitor sodium levels. ? ?Hypothyroidism ?Noted to be on Synthroid.  TSH is elevated.  Check free T4.  May need dose adjustment. ? ?History of osteoarthritis ?Continue as needed tramadol for pain control ? ?Invasive carcinoma of breast ([place]) ?Status post partial mastectomy as well as radiation therapy ?Continue tamoxifen ? ?Normocytic anemia ?Drop in hemoglobin is dilutional.  No evidence of overt bleeding.  Continue to monitor. ? ?Vitamin B12 deficiency ?Replace B12 with IM injections initially and then transition to oral. ? ? ? ?DVT Prophylaxis: Lovenox ?Code Status: Full code ?Family Communication: No family at bedside ?Disposition Plan: To be determined ? ?Status is: Inpatient ?Remains inpatient appropriate because: Inability to ambulate due to pain issues.  Unsafe discharge at this time ? ? ? ? ?Medications: Scheduled: ? atorvastatin  80 mg Oral Daily  ? bupivacaine(PF)  10 mL Intra-articular Once  ? busPIRone  7.5 mg Oral BID  ? cholecalciferol  1,000 Units Oral BID  ? citalopram  20 mg Oral Daily  ? cyanocobalamin  1,000 mcg Intramuscular Daily  ? Followed by  ? [START ON 06/14/2021] vitamin B-12  1,000 mcg Oral Daily  ? donepezil  5 mg Oral QHS  ? enoxaparin (LOVENOX) injection  40 mg Subcutaneous V67M  ? folic acid  1 mg Oral Daily  ? levothyroxine  50 mcg Oral QAC breakfast  ? LORazepam  0-4 mg Oral Q6H  ? Followed by  ? [START ON 06/10/2021] LORazepam  0-4 mg Oral Q12H  ? losartan  50 mg Oral Daily  ? multivitamin with minerals  1 tablet Oral Daily  ? potassium chloride  40 mEq Oral Q4H  ? tamoxifen  20 mg Oral Daily  ? thiamine  100 mg Oral Daily  ? Or  ? thiamine  100 mg Intravenous Daily  ? triamcinolone acetonide  80  mg Intra-articular Once  ? ?Continuous: ? 0.9 % NaCl with KCl 20 mEq / L 75 mL/hr at 06/09/21 1610  ? ?RUE:[id number] **OR** LORazepam, morphine injection, ondansetron **OR** ondansetron (ZOFRAN) IV ? ?Antibiotics: ?Anti-infectives (From admission, onward)  ? ? None  ? ?  ? ? ?Objective: ? ?Vital Signs ? ?Vitals:  ? 06/08/21 1920 06/08/21 2023 06/09/21 0215 06/09/21 0440  ?BP: 116/69 132/74 120/64 130/77  ?Pulse: (!) 103 (!) 102 96 (!) 109  ?Resp: 16   16  ?Temp: 98.1 ?F (36.7 ?C)   99 ?F (37.2 ?C)  ?TempSrc: Oral   Oral  ?SpO2: 92%   93%  ?Weight:      ?Height:      ? ? ?Intake/Output Summary (Last 24 hours) at 06/09/2021 1056 ?Last data filed at 06/08/2021 1910 ?Gross per 24 hour  ?Intake 240 ml  ?Output 100 ml  ?Net 140 ml  ? ?Filed Weights  ? 06/08/21 0907  ?Weight: 63.5 kg  ? ? ?General appearance: Awake alert.  In no distress. mildly distracted ?Resp: Clear to auscultation bilaterally.  Normal effort ?Cardio: S1-S2 is normal regular.  No S3-S4.  No rubs murmurs or bruit ?GI: Abdomen is soft.  Nontender nondistended.  Bowel sounds are present normal.  No masses organomegaly ?Extremities: Swollen right knee noted without any erythema.  Restricted range of motion.  Tender. ?Neurologic:  No focal neurological deficits.  ? ? ?Lab Results: ? ?Data Reviewed: I have personally reviewed labs and imaging study reports ? ?CBC: ?Recent Labs  ?Lab 06/08/21 ?0948 06/09/21 ?1478  ?WBC 10.9* 11.7*  ?HGB 12.2 11.6*  ?HCT 35.8* 33.7*  ?MCV 89.9 89.2  ?PLT 250 218  ? ? ?Basic Metabolic Panel: ?Recent Labs  ?Lab 06/08/21 ?0948 06/08/21 ?1514 06/09/21 ?2956  ?NA 133*  --  131*  ?K 3.3*  --  2.9*  ?CL 98  --  95*  ?CO2 25  --  27  ?GLUCOSE 125*  --  120*  ?BUN 16  --  9  ?CREATININE 0.52  --  0.55  ?CALCIUM 8.7*  --  8.6*  ?MG  --  1.7  --   ? ? ?GFR: ?Estimated Creatinine Clearance: 57.8 mL/min (by C-G formula based on SCr of 0.55 mg/dL). ? ?Liver Function Tests: ?Recent Labs  ?Lab 06/08/21 ?0948  ?AST 29  ?ALT 22  ?ALKPHOS 66   ?BILITOT 1.0  ?PROT 7.4  ?ALBUMIN 4.1  ? ? ? ?Cardiac Enzymes: ?Recent Labs  ?Lab 06/08/21 ?1514  ?CKTOTAL 145  ? ? ? ?Thyroid Function Tests: ?Recent Labs  ?  06/08/21 ?1514  ?TSH 6.510*  ? ? ?Anemia Panel: ?Recent Labs  ?  06/08/21 ?1514  ?VITAMINB12 150*  ?FOLATE 6.0  ? ? ? ?Radiology Studies: ?DG Pelvis 1-2 Views ? ?Result Date: 06/08/2021 ?CLINICAL DATA:  Fall with weakness and injury. EXAM: PELVIS - 1-2 VIEW COMPARISON:  None. FINDINGS: Bones are demineralized. No fracture or dislocation. SI joints and symphysis pubis unremarkable. Mild degenerative changes noted in both hips. IMPRESSION: Negative. Electronically Signed  By: Misty Stanley M.D.   On: 06/08/2021 10:19  ? ?CT Head Wo Contrast ? ?Result Date: 06/08/2021 ?CLINICAL DATA:  Altered mental status EXAM: CT HEAD WITHOUT CONTRAST TECHNIQUE: Contiguous axial images were obtained from the base of the skull through the vertex without intravenous contrast. RADIATION DOSE REDUCTION: This exam was performed according to the departmental dose-optimization program which includes automated exposure control, adjustment of the mA and/or kV according to patient size and/or use of iterative reconstruction technique. COMPARISON:  None. FINDINGS: Brain: No acute intracranial findings are seen. Cortical sulci are prominent. There is no focal edema or mass effect. There are no epidural or subdural fluid collections. Vascular: Scattered arterial calcifications are seen. Skull: Unremarkable. Sinuses/Orbits: There is mucosal thickening in the right maxillary sinus. There is metallic density in the inferior aspect of right maxillary sinus, possibly part of dental prostheses. There is mild mucosal thickening in the ethmoid sinus. Other: None IMPRESSION: No acute intracranial findings are seen in noncontrast CT brain. Atrophy. Chronic ethmoid and right maxillary sinusitis. Electronically Signed   By: Elmer Picker M.D.   On: 06/08/2021 10:29  ? ?CT Knee Right Wo  Contrast ? ?Result Date: 06/08/2021 ?CLINICAL DATA:  Knee pain, stress fracture suspected. Possible lateral tibial plateau fracture on radiographs. EXAM: CT OF THE RIGHT KNEE WITHOUT CONTRAST TECHNIQUE: Multidetector CT i

## 2021-06-09 NOTE — Consult Note (Signed)
ORTHOPAEDIC CONSULTATION ? ?REQUESTING PHYSICIAN: Bonnielee Haff, MD ? ?Chief Complaint:   ?Right knee pain and swelling. ? ?History of Present Illness: ?Christy Braun is a 76 y.o. female with a history of hyperlipidemia, hypertension, hypothyroidism, and breast cancer who lives independently.  The patient apparently was in her usual state of health until 2 days ago when she apparently could not get up off the floor after getting down on the floor to pick up something.  She crawled to the phone and called her daughter-in-law who came to get her.  Because of significant pain and swelling in her knee, as well as the apparent general deteriorated state of the patient psychosocially, the patient was brought to the emergency room.  X-rays and a CT scan of the knee were obtained and were unremarkable for any acute injury/fracture.  However, the patient was admitted for pain control, progressive mobilization, and possible rehab placement.   ? ?The patient denies falling or sustaining any particular injury to the knee.  Furthermore, she denies any head injury or loss of consciousness.  She also denies any lightheadedness, dizziness, chest pain, shortness of breath, or any other associated symptoms. ? ?According to the daughter-in-law who is at the bedside, the patient apparently was quite independent until about 4 weeks ago.  Subsequently, there has been a fairly rapid decline in her ability to care for herself.  She is concerned that the patient may be drinking excessively. ? ?Past Medical History:  ?Diagnosis Date  ? Breast cancer ([place])   ? Complication of anesthesia   ? Hyperlipidemia   ? Hypertension   ? Hypothyroidism   ? Personal history of radiation therapy   ? PONV (postoperative nausea and vomiting)   ? ?Past Surgical History:  ?Procedure Laterality Date  ? BACK SURGERY    ? lumbar ruptured disc  ? BREAST BIOPSY Left 9/5/20019  ? u/s core, Invasive  mammary carcinoma W/ Mucous features  ? BREAST LUMPECTOMY Left 12/09/2017  ? NL with SN  ? FOOT SURGERY Bilateral   ? x 3 bunionectomy  ? PARTIAL MASTECTOMY WITH NEEDLE LOCALIZATION AND AXILLARY SENTINEL LYMPH NODE BX Left 12/09/2017  ? Procedure: PARTIAL MASTECTOMY WITH NEEDLE LOCALIZATION AND AXILLARY SENTINEL LYMPH NODE BX;  Surgeon: Benjamine Sprague, DO;  Location: [facility] ORS;  Service: General;  Laterality: Left;  ? ?Social History  ? ?Socioeconomic History  ? Marital status: Widowed  ?  Spouse name: Not on file  ? Number of children: Not on file  ? Years of education: Not on file  ? Highest education level: Not on file  ?Occupational History  ? Not on file  ?Tobacco Use  ? Smoking status: Former  ?  Types: Cigarettes  ?  Quit date: 12/08/1968  ?  Years since quitting: 52.5  ? Smokeless tobacco: Never  ? Tobacco comments:  ?  quit smoking over 50 yrs ago  ?Vaping Use  ? Vaping Use: Never used  ?Substance and Sexual Activity  ? Alcohol use: Yes  ?  Comment: rare just glass  ? Drug use: Never  ? Sexual activity: Not Currently  ?Other Topics Concern  ? Not on file  ?Social History Narrative  ? Not on file  ? ?Social Determinants of Health  ? ?Financial Resource Strain: Not on file  ?Food Insecurity: Not on file  ?Transportation Needs: Not on file  ?Physical Activity: Not on file  ?Stress: Not on file  ?Social Connections: Not on file  ? ?Family History  ?Problem Relation  Age of Onset  ? Ovarian cancer Mother   ? Heart disease Father   ? Diabetes Father   ? Breast cancer Neg Hx   ? ?Allergies  ?Allergen Reactions  ? Penicillins Hives, Swelling and Other (See Comments)  ?  "knots" on legs ?Has patient had a PCN reaction causing immediate rash, facial/tongue/throat swelling, SOB or lightheadedness with hypotension: No ?Has patient had a PCN reaction causing severe rash involving mucus membranes or skin necrosis: No ?Has patient had a PCN reaction that required hospitalization: No ?Has patient had a PCN reaction occurring  within the last 10 years: No ?If all of the above answers are "NO", then may proceed with Cephalosporin use. ? ?  ? Oxycodone-Acetaminophen Anxiety and Other (See Comments)  ?  confusion  ? ?Prior to Admission medications   ?Medication Sig Start Date End Date Taking? Authorizing Provider  ?atorvastatin (LIPITOR) 80 MG tablet Take 80 mg by mouth daily. 04/07/14  Yes [provider]  ?busPIRone (BUSPAR) 7.5 MG tablet Take 7.5 mg by mouth 2 (two) times daily. 01/15/21  Yes [provider]  ?Cholecalciferol (VITAMIN D3) 1000 units CAPS Take 1,000 Units by mouth 2 (two) times daily.   Yes [provider]  ?donepezil (ARICEPT) 5 MG tablet Take 5 mg by mouth at bedtime. 12/18/20  Yes [provider]  ?ibuprofen (ADVIL,MOTRIN) 800 MG tablet Take 1 tablet (800 mg total) by mouth every 8 (eight) hours as needed for mild pain or moderate pain. 12/09/17  Yes Sakai, Isami, DO  ?Multiple Vitamin (MULTIVITAMIN WITH MINERALS) TABS tablet Take 1 tablet by mouth daily.   Yes [provider]  ?tamoxifen (NOLVADEX) 20 MG tablet TAKE 1 TABLET BY MOUTH DAILY 02/15/19  Yes Sindy Guadeloupe, MD  ?citalopram (CELEXA) 20 MG tablet Take 20 mg by mouth daily. 12/16/14 07/31/18  [provider]  ?lansoprazole (PREVACID) 30 MG capsule Take 1 capsule (30 mg total) by mouth daily at 12 noon. ?Patient not taking: Reported on 06/08/2021 01/09/18   Noreene Filbert, MD  ?latanoprost (XALATAN) 0.005 % ophthalmic solution Place 1 drop into both eyes at bedtime. ?Patient not taking: Reported on 06/08/2021    [provider]  ?letrozole ([place]) 2.5 MG tablet TAKE ONE TABLET BY MOUTH EVERY DAY ?Patient not taking: Reported on 06/08/2021 09/21/18   Sindy Guadeloupe, MD  ?levothyroxine (SYNTHROID, LEVOTHROID) 50 MCG tablet Take 50 mcg by mouth daily before breakfast. 02/11/17 07/31/18  [provider]  ?losartan-hydrochlorothiazide (HYZAAR) 50-12.5 MG tablet Take 1 tablet by mouth daily. 03/04/17  07/31/18  [provider]  ?methocarbamol (ROBAXIN) 500 MG tablet Take 1 tablet (500 mg total) by mouth every 6 (six) hours as needed for muscle spasms (breakthrough pain). ?Patient not taking: Reported on 06/08/2021 05/29/20   Vladimir Crofts, MD  ?mirtazapine (REMERON) 7.5 MG tablet Take 7.5 mg by mouth at bedtime. ?Patient not taking: Reported on 06/08/2021 01/15/21   [provider]  ? ?DG Pelvis 1-2 Views ? ?Result Date: 06/08/2021 ?CLINICAL DATA:  Fall with weakness and injury. EXAM: PELVIS - 1-2 VIEW COMPARISON:  None. FINDINGS: Bones are demineralized. No fracture or dislocation. SI joints and symphysis pubis unremarkable. Mild degenerative changes noted in both hips. IMPRESSION: Negative. Electronically Signed   By: Misty Stanley M.D.   On: 06/08/2021 10:19  ? ?CT Head Wo Contrast ? ?Result Date: 06/08/2021 ?CLINICAL DATA:  Altered mental status EXAM: CT HEAD WITHOUT CONTRAST TECHNIQUE: Contiguous axial images were obtained from the base of the skull  through the vertex without intravenous contrast. RADIATION DOSE REDUCTION: This exam was performed according to the departmental dose-optimization program which includes automated exposure control, adjustment of the mA and/or kV according to patient size and/or use of iterative reconstruction technique. COMPARISON:  None. FINDINGS: Brain: No acute intracranial findings are seen. Cortical sulci are prominent. There is no focal edema or mass effect. There are no epidural or subdural fluid collections. Vascular: Scattered arterial calcifications are seen. Skull: Unremarkable. Sinuses/Orbits: There is mucosal thickening in the right maxillary sinus. There is metallic density in the inferior aspect of right maxillary sinus, possibly part of dental prostheses. There is mild mucosal thickening in the ethmoid sinus. Other: None IMPRESSION: No acute intracranial findings are seen in noncontrast CT brain. Atrophy. Chronic ethmoid and right maxillary sinusitis.  Electronically Signed   By: Elmer Picker M.D.   On: 06/08/2021 10:29  ? ?CT Knee Right Wo Contrast ? ?Result Date: 06/08/2021 ?CLINICAL DATA:  Knee pain, stress fracture suspected. Possible lateral tibial

## 2021-06-09 NOTE — Progress Notes (Signed)
PT Cancellation Note ? ?Patient Details ?Name: ANICA ALCARAZ ?MRN: [id number] ?DOB: June 06, 1945 ? ? ?Cancelled Treatment:    Reason Eval/Treat Not Completed: Other (comment) PT orders received, chart reviewed. Ortho consult placed but no note yet. Will hold PT evaluation until pt has been seen/cleared by ortho.  ? ?Lavone Nian, PT, DPT ?06/09/21, 8:33 AM ? ?Waunita Schooner ?06/09/2021, 8:32 AM ?

## 2021-06-09 NOTE — NC FL2 (Signed)
?[place] MEDICAID FL2 LEVEL OF CARE SCREENING TOOL  ?  ? ?IDENTIFICATION  ?Patient Name: ?Christy Braun Birthdate: January 28, 1946 Sex: female Admission Date (Current Location): ?06/08/2021  ?[place] and [place] Number: ? [place] ?  Facility and Address:  ?[facility], [address], [place], [place] [address] ?     Provider Number: ?[id number]  ?Attending Physician Name and Address:  ?Bonnielee Haff, MD ? Relative Name and Phone Number:  ?HITE,LISA (Daughter)   [phone] (Mobile) ?   ?Current Level of Care: ?Hospital Recommended Level of Care: ?[place] Prior Approval Number: ?  ? ?Date Approved/Denied: ?  PASRR Number: ?[id number] A ? ?Discharge Plan: ?  ?  ? ?Current Diagnoses: ?Patient Active Problem List  ? Diagnosis Date Noted  ? Pressure injury of skin 06/09/2021  ? Vitamin B12 deficiency 06/09/2021  ? Normocytic anemia 06/09/2021  ? Fall 06/08/2021  ? Dementia ([place]) 06/08/2021  ? Hypothyroidism 06/08/2021  ? Right knee pain 06/08/2021  ? Hypokalemia 06/08/2021  ? Invasive carcinoma of breast ([place]) 12/08/2017  ? Vitamin D deficiency 12/05/2017  ? Hyperlipidemia 12/05/2017  ? Hypertension 12/05/2017  ? Fibrocystic breast disease 12/05/2017  ? Allergic rhinitis 12/05/2017  ? Hepatic steatosis 10/07/2017  ? History of osteoarthritis 01/24/2014  ? Dyspepsia 01/24/2014  ? ? ?Orientation RESPIRATION BLADDER Height & Weight   ?  ?Self, Time, Situation, Place ? Normal External catheter Weight: 140 lb (63.5 kg) ?Height:  '5\' 6"'$  (167.6 cm)  ?BEHAVIORAL SYMPTOMS/MOOD NEUROLOGICAL BOWEL NUTRITION STATUS  ?    Incontinent Diet (2 gram sodium)  ?AMBULATORY STATUS COMMUNICATION OF NEEDS Skin   ?Limited Assist Verbally Other (Comment) (ischial tuberosity stage 1 left and right, bilateral heel stage 1) ?  ?  ?  ?    ?     ?     ? ? ?Personal Care Assistance Level of Assistance  ?Bathing, Feeding, Dressing Bathing Assistance: Limited assistance ?Feeding assistance: Limited  assistance ?Dressing Assistance: Limited assistance ?   ? ?Functional Limitations Info  ?    ?  ?   ? ? ?SPECIAL CARE FACTORS FREQUENCY  ?PT (By licensed PT), OT (By licensed OT)   ?  ?PT Frequency: 5 times per week ?OT Frequency: 5 times per week ?  ?  ?  ?   ? ? ?Contractures    ? ? ?Additional Factors Info  ?Code Status, Allergies Code Status Info: full ?Allergies Info: penicillins, oxycodone-acetaminophen ?  ?  ?  ?   ? ?Current Medications (06/09/2021):  This is the current hospital active medication list ?Current Facility-Administered Medications  ?Medication Dose Route Frequency Provider Last Rate Last Admin  ? 0.9 % NaCl with KCl 20 mEq/ L  infusion   Intravenous Continuous Bonnielee Haff, MD 75 mL/hr at 06/09/21 0812 New Bag at 06/09/21 0812  ? atorvastatin (LIPITOR) tablet 80 mg  80 mg Oral Daily Agbata, Tochukwu, MD   80 mg at 06/09/21 0814  ? busPIRone (BUSPAR) tablet 7.5 mg  7.5 mg Oral BID Agbata, Tochukwu, MD   7.5 mg at 06/09/21 0814  ? cholecalciferol (VITAMIN D) tablet 1,000 Units  1,000 Units Oral BID Collier Bullock, MD   1,000 Units at 06/09/21 0815  ? citalopram (CELEXA) tablet 20 mg  20 mg Oral Daily Agbata, Tochukwu, MD   20 mg at 06/09/21 0815  ? cyanocobalamin ((VITAMIN B-12)) injection 1,000 mcg  1,000 mcg Intramuscular Daily Bonnielee Haff, MD   1,000 mcg at 06/09/21 1235  ? Followed by  ? [  START ON 06/14/2021] vitamin B-12 (CYANOCOBALAMIN) tablet 1,000 mcg  1,000 mcg Oral Daily Bonnielee Haff, MD      ? donepezil (ARICEPT) tablet 5 mg  5 mg Oral QHS Agbata, Tochukwu, MD   5 mg at 06/08/21 2244  ? enoxaparin (LOVENOX) injection 40 mg  40 mg Subcutaneous Q24H Agbata, Tochukwu, MD   40 mg at 06/08/21 2051  ? folic acid (FOLVITE) tablet 1 mg  1 mg Oral Daily Agbata, Tochukwu, MD   1 mg at 06/09/21 0815  ? levothyroxine (SYNTHROID) tablet 50 mcg  50 mcg Oral QAC breakfast Agbata, Tochukwu, MD   50 mcg at 06/09/21 0550  ? LORazepam (ATIVAN) tablet 1-4 mg  1-4 mg Oral Q1H PRN Agbata,  Tochukwu, MD      ? Or  ? LORazepam (ATIVAN) injection 1-4 mg  1-4 mg Intravenous Q1H PRN Agbata, Tochukwu, MD   2 mg at 06/08/21 1840  ? losartan (COZAAR) tablet 50 mg  50 mg Oral Daily Noralee Space, RPH   50 mg at 06/09/21 8657  ? morphine (PF) 2 MG/ML injection 2 mg  2 mg Intravenous Q4H PRN Agbata, Tochukwu, MD   2 mg at 06/08/21 2025  ? multivitamin with minerals tablet 1 tablet  1 tablet Oral Daily Agbata, Tochukwu, MD   1 tablet at 06/09/21 0814  ? ondansetron (ZOFRAN) tablet 4 mg  4 mg Oral Q6H PRN Agbata, Tochukwu, MD      ? Or  ? ondansetron (ZOFRAN) injection 4 mg  4 mg Intravenous Q6H PRN Agbata, Tochukwu, MD   4 mg at 06/09/21 0646  ? potassium chloride SA (KLOR-CON M) CR tablet 40 mEq  40 mEq Oral Q4H Bonnielee Haff, MD   40 mEq at 06/09/21 0815  ? tamoxifen (NOLVADEX) tablet 20 mg  20 mg Oral Daily Agbata, Tochukwu, MD   20 mg at 06/09/21 0814  ? thiamine tablet 100 mg  100 mg Oral Daily Agbata, Tochukwu, MD   100 mg at 06/09/21 0815  ? Or  ? thiamine (B-1) injection 100 mg  100 mg Intravenous Daily Agbata, Tochukwu, MD      ? ? ? ?Discharge Medications: ?Please see discharge summary for a list of discharge medications. ? ?Relevant Imaging Results: ? ?Relevant Lab Results: ? ? ?Additional Information ?SS #: [phone] ? ?Khelani Kops E Britian Jentz, LCSW ? ? ? ? ?

## 2021-06-09 NOTE — Assessment & Plan Note (Addendum)
Drop in hemoglobin is dilutional.  No evidence of overt bleeding.  Hemoglobin is stable. ?

## 2021-06-09 NOTE — Evaluation (Signed)
Physical Therapy Evaluation Patient Details Name: Christy Braun MRN: [id number] DOB: [birth year] Today's Date: 06/09/2021  History of Present Illness  Pt is a 76 y/o F admitted on 06/08/21 after presenting with c/o weakness & frequent falls at home & R knee pain. X ray shows moderate-sized joint effusion with articular surface irregularity of the posterolateral tibial plateau. Orthopedics was consulted & R knee was aspirated; pt is WBAT RLE. PMH: hypothyroidism, HTN, breast CA s/p partial mastectomy & radiation therapy, dementia  Clinical Impression  Pt seen for PT evaluation with daughter present for session. Pt has been experiencing a functional decline in the past month at home but prior to this was independent & driving. On this date, pt endorses significant R knee pain that was monitored throughout session. Pt is able to complete supine>sit with supervision but extra time & reliance on hospital bed features. Pt requires min assist for sit>stand & stand pivot & up to mod assist to attempt taking 2 steps forwards + backwards with RW. Pt demonstrates decreased weight shift & weight bearing through RLE throughout all mobility 2/2 pain. At this time pt would benefit from STR upon d/c to maximize independence with functional mobility & reduce fall risk prior to return home.    Recommendations for follow up therapy are one component of a multi-disciplinary discharge planning process, led by the attending physician.  Recommendations may be updated based on patient status, additional functional criteria and insurance authorization.  Follow Up Recommendations Skilled nursing-short term rehab (<3 hours/day)    Assistance Recommended at Discharge Frequent or constant Supervision/Assistance  Patient can return home with the following  A lot of help with walking and/or transfers;A lot of help with bathing/dressing/bathroom;Assistance with cooking/housework;Assist for transportation;Direct supervision/assist  for financial management;Help with stairs or ramp for entrance;Direct supervision/assist for medications management    Equipment Recommendations Rolling walker (2 wheels);BSC/3in1  Recommendations for Other Services    OT consult   Functional Status Assessment Patient has had a recent decline in their functional status and demonstrates the ability to make significant improvements in function in a reasonable and predictable amount of time.     Precautions / Restrictions Precautions Precautions: Fall Restrictions Weight Bearing Restrictions: Yes RLE Weight Bearing: Weight bearing as tolerated      Mobility  Bed Mobility Overal bed mobility: Needs Assistance Bed Mobility: Supine to Sit     Supine to sit: Supervision, HOB elevated     General bed mobility comments: cuing for sequencing/technique, extra time to complete movement    Transfers Overall transfer level: Needs assistance Equipment used: Rolling walker (2 wheels) Transfers: Sit to/from Stand, Bed to chair/wheelchair/BSC Sit to Stand: Min assist   Step pivot transfers: Min assist       General transfer comment: cuing for safe hand placement during sit<>stand    Ambulation/Gait Ambulation/Gait assistance: Min assist Gait Distance (Feet): 2 Feet Assistive device: Rolling walker (2 wheels) Gait Pattern/deviations: Step-to pattern, Decreased step length - right, Decreased step length - left, Decreased stride length, Decreased dorsiflexion - right, Decreased dorsiflexion - left, Decreased weight shift to right Gait velocity: decreased     General Gait Details: 2 steps forwards with min assist + 2 steps backwards with mod assist with PT  Stairs            Wheelchair Mobility    Modified Rankin (Stroke Patients Only)       Balance Overall balance assessment: Needs assistance Sitting-balance support: Feet supported, Bilateral upper extremity supported Sitting balance-Leahy  Scale: Fair     Standing  balance support: During functional activity, Bilateral upper extremity supported, Reliant on assistive device for balance Standing balance-Leahy Scale: Poor                               Pertinent Vitals/Pain Pain Assessment Pain Assessment: Faces Faces Pain Scale: Hurts even more Pain Location: R knee & groin Pain Descriptors / Indicators: Grimacing, Discomfort Pain Intervention(s): Monitored during session, Repositioned    Home Living Family/patient expects to be discharged to:: Private residence Living Arrangements: Alone Available Help at Discharge: Family;Available PRN/intermittently Type of Home: House Home Access: Stairs to enter Entrance Stairs-Rails: Right Entrance Stairs-Number of Steps: 4   Home Layout: One level Home Equipment: Agricultural consultant (2 wheels)      Prior Function Prior Level of Function : Driving;Independent/Modified Independent;History of Falls (last six months)             Mobility Comments: Pt has been experiencing a functional decline over the past ~month. Prior to this pt was independent, driving, working out 3x/week. Pt reports 1-2 falls in the past 6 months but reports she gets down on floor & has difficulty getting back up, vs falling. Pt began using RW recently.       Hand Dominance        Extremity/Trunk Assessment   Upper Extremity Assessment Upper Extremity Assessment: Generalized weakness    Lower Extremity Assessment Lower Extremity Assessment: Generalized weakness (RLE knee ROM limited by pain, almost able to fully extend R knee when sitting EOB)    Cervical / Trunk Assessment Cervical / Trunk Assessment: Normal  Communication   Communication: No difficulties  Cognition Arousal/Alertness: Awake/alert Behavior During Therapy: Flat affect Overall Cognitive Status:  (need to assess further in functional context)                                 General Comments: Pt internally distracted by IV &  lines throughout session, delayed processing time & requires frequent cuing to perform simple tasks.        General Comments      Exercises     Assessment/Plan    PT Assessment Patient needs continued PT services  PT Problem List Decreased strength;Decreased mobility;Decreased safety awareness;Decreased range of motion;Decreased knowledge of precautions;Decreased activity tolerance;Decreased cognition;Cardiopulmonary status limiting activity;Pain;Decreased knowledge of use of DME;Decreased balance       PT Treatment Interventions DME instruction;Therapeutic exercise;Balance training;Gait training;Stair training;Neuromuscular re-education;Cognitive remediation;Patient/family education;Functional mobility training;Therapeutic activities;Modalities;Manual techniques    PT Goals (Current goals can be found in the Care Plan section)  Acute Rehab PT Goals Patient Stated Goal: decreased pain PT Goal Formulation: With patient Time For Goal Achievement: 06/23/21 Potential to Achieve Goals: Good    Frequency Min 2X/week     Co-evaluation               AM-PAC PT "6 Clicks" Mobility  Outcome Measure Help needed turning from your back to your side while in a flat bed without using bedrails?: A Little Help needed moving from lying on your back to sitting on the side of a flat bed without using bedrails?: A Little Help needed moving to and from a bed to a chair (including a wheelchair)?: A Little Help needed standing up from a chair using your arms (e.g., wheelchair or bedside chair)?: A Little Help needed to walk  in hospital room?: A Lot Help needed climbing 3-5 steps with a railing? : Total 6 Click Score: 15    End of Session Equipment Utilized During Treatment: Gait belt Activity Tolerance: Patient tolerated treatment well;Patient limited by fatigue;Patient limited by pain Patient left: in chair;with chair alarm set;with call bell/phone within reach;with family/visitor  present Nurse Communication: Mobility status PT Visit Diagnosis: Unsteadiness on feet (R26.81);Muscle weakness (generalized) (M62.81);Difficulty in walking, not elsewhere classified (R26.2);Pain Pain - Right/Left: Right Pain - part of body: Knee    Time: 1143-1209 PT Time Calculation (min) (ACUTE ONLY): 26 min   Charges:   PT Evaluation $PT Eval Low Complexity: 1 Low PT Treatments $Therapeutic Activity: 8-22 mins        Aleda Grana, PT, DPT 06/09/21, 12:37 PM   Sandi Mariscal 06/09/2021, 12:35 PM

## 2021-06-09 NOTE — Assessment & Plan Note (Addendum)
Replace B12 with IM injections initially and then transition to oral. ?Recheck B12 level in 3 to 4 weeks. ?

## 2021-06-10 DIAGNOSIS — E039 Hypothyroidism, unspecified: Secondary | ICD-10-CM | POA: Diagnosis not present

## 2021-06-10 DIAGNOSIS — M25561 Pain in right knee: Secondary | ICD-10-CM | POA: Diagnosis not present

## 2021-06-10 DIAGNOSIS — F0393 Unspecified dementia, unspecified severity, with mood disturbance: Secondary | ICD-10-CM | POA: Diagnosis not present

## 2021-06-10 LAB — BASIC METABOLIC PANEL
Anion gap: 5 (ref 5–15)
BUN: 12 mg/dL (ref 8–23)
CO2: 24 mmol/L (ref 22–32)
Calcium: 8.5 mg/dL — ABNORMAL LOW (ref 8.9–10.3)
Chloride: 104 mmol/L (ref 98–111)
Creatinine, Ser: 0.52 mg/dL (ref 0.44–1.00)
GFR, Estimated: >60 mL/min (ref >60)
Glucose, Bld: 152 mg/dL — ABNORMAL HIGH (ref 70–99)
Potassium: 4.2 mmol/L (ref 3.5–5.1)
Sodium: 133 mmol/L — ABNORMAL LOW (ref 135–145)

## 2021-06-10 LAB — CBC
HCT: 31.8 % — ABNORMAL LOW (ref 36.0–46.0)
Hemoglobin: 11 g/dL — ABNORMAL LOW (ref 12.0–15.0)
MCH: 30.6 pg (ref 26.0–34.0)
MCHC: 34.6 g/dL (ref 30.0–36.0)
MCV: 88.6 fL (ref 80.0–100.0)
Platelets: 214 K/uL (ref 150–400)
RBC: 3.59 MIL/uL — ABNORMAL LOW (ref 3.87–5.11)
RDW: 14.8 % (ref 11.5–15.5)
WBC: 9.1 K/uL (ref 4.0–10.5)
nRBC: 0 % (ref 0.0–0.2)

## 2021-06-10 LAB — T4, FREE: Free T4: 0.94 ng/dL (ref 0.61–1.12)

## 2021-06-10 LAB — MAGNESIUM: Magnesium: 2 mg/dL (ref 1.7–2.4)

## 2021-06-10 IMAGING — DX DG SHOULDER 2+V PORT*R*
3 series · 3 of 3 positions shown · non-contrast
Comparison: None.

CLINICAL DATA: Evaluate dislocation. Right shoulder pain.
Tenderness to touch.

EXAM:
PORTABLE RIGHT SHOULDER

[shoulder ap]
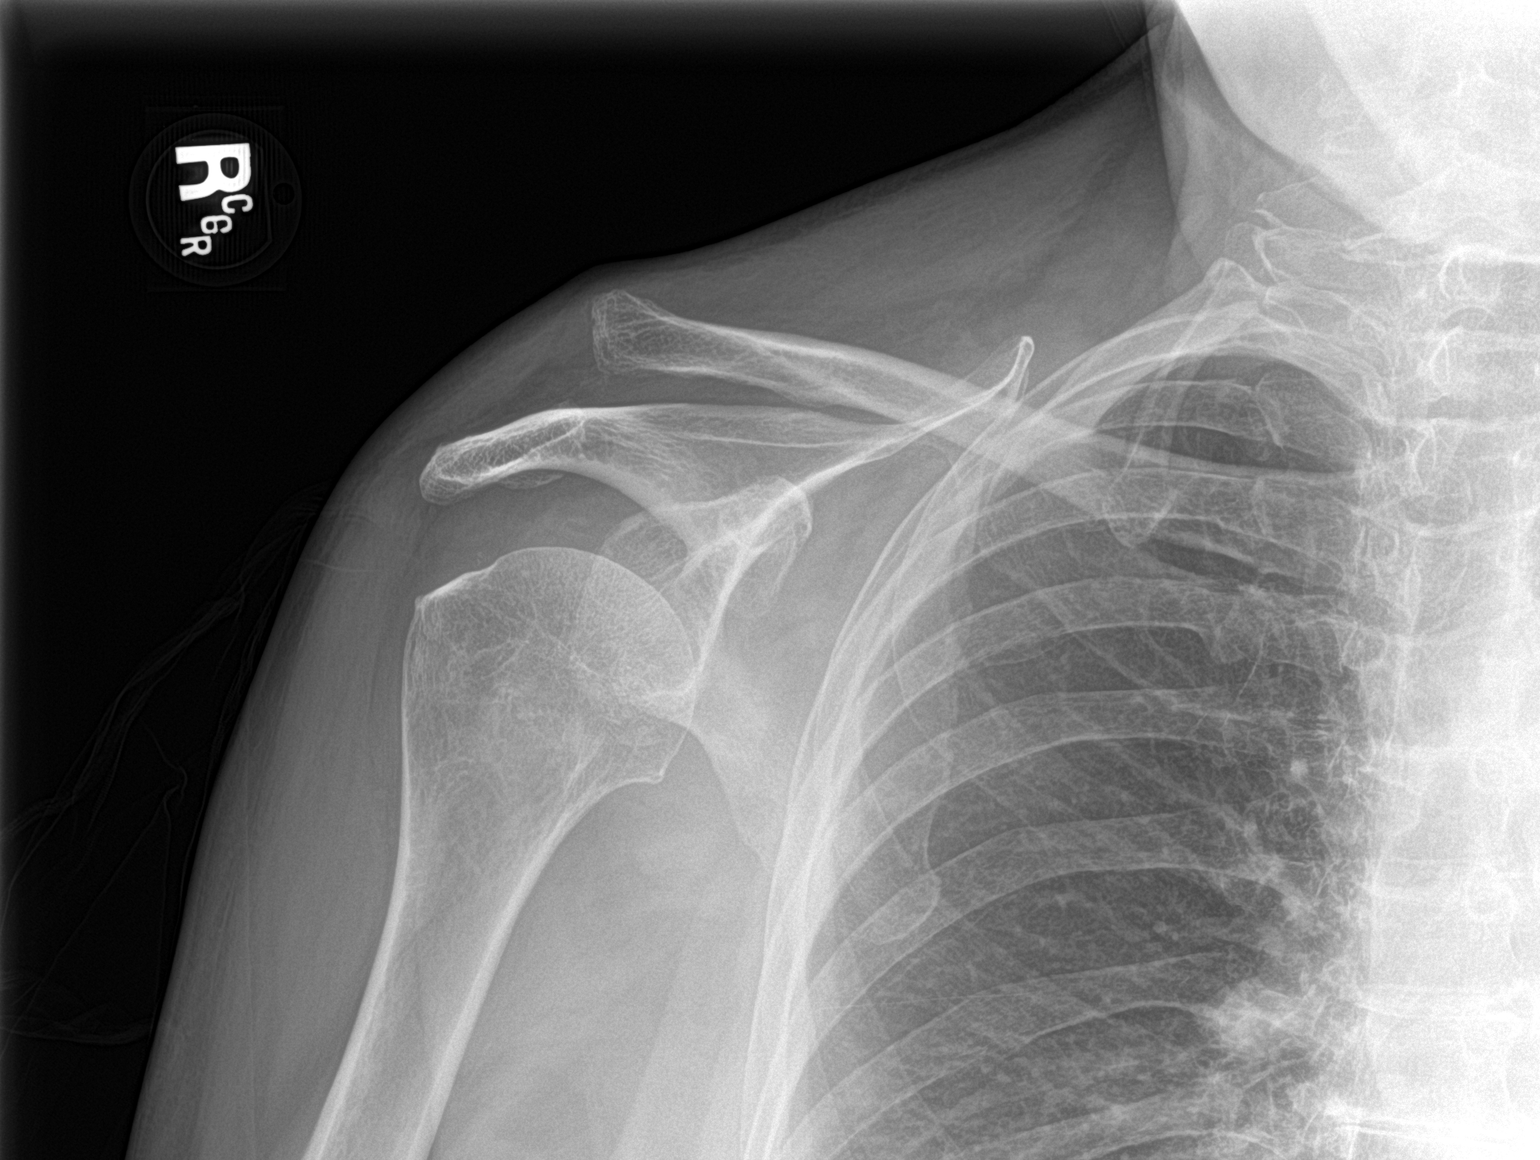

[shoulder axial]
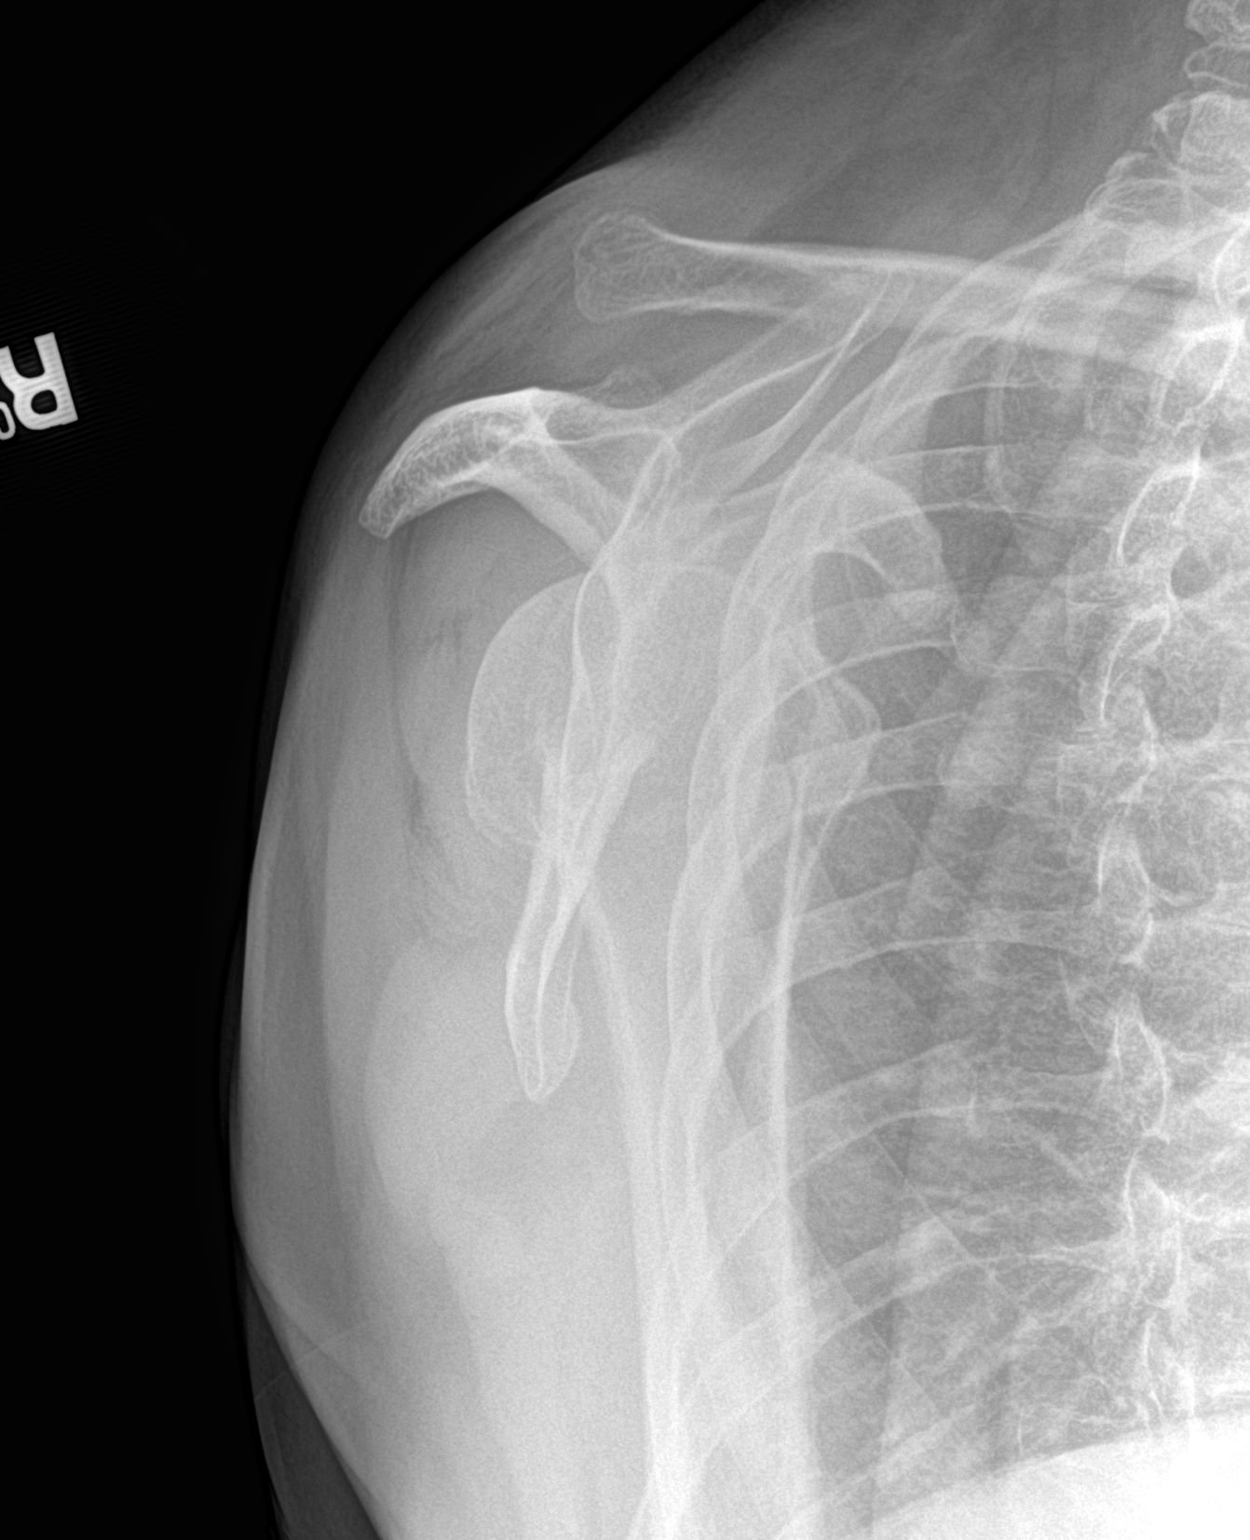

[shoulder obl]
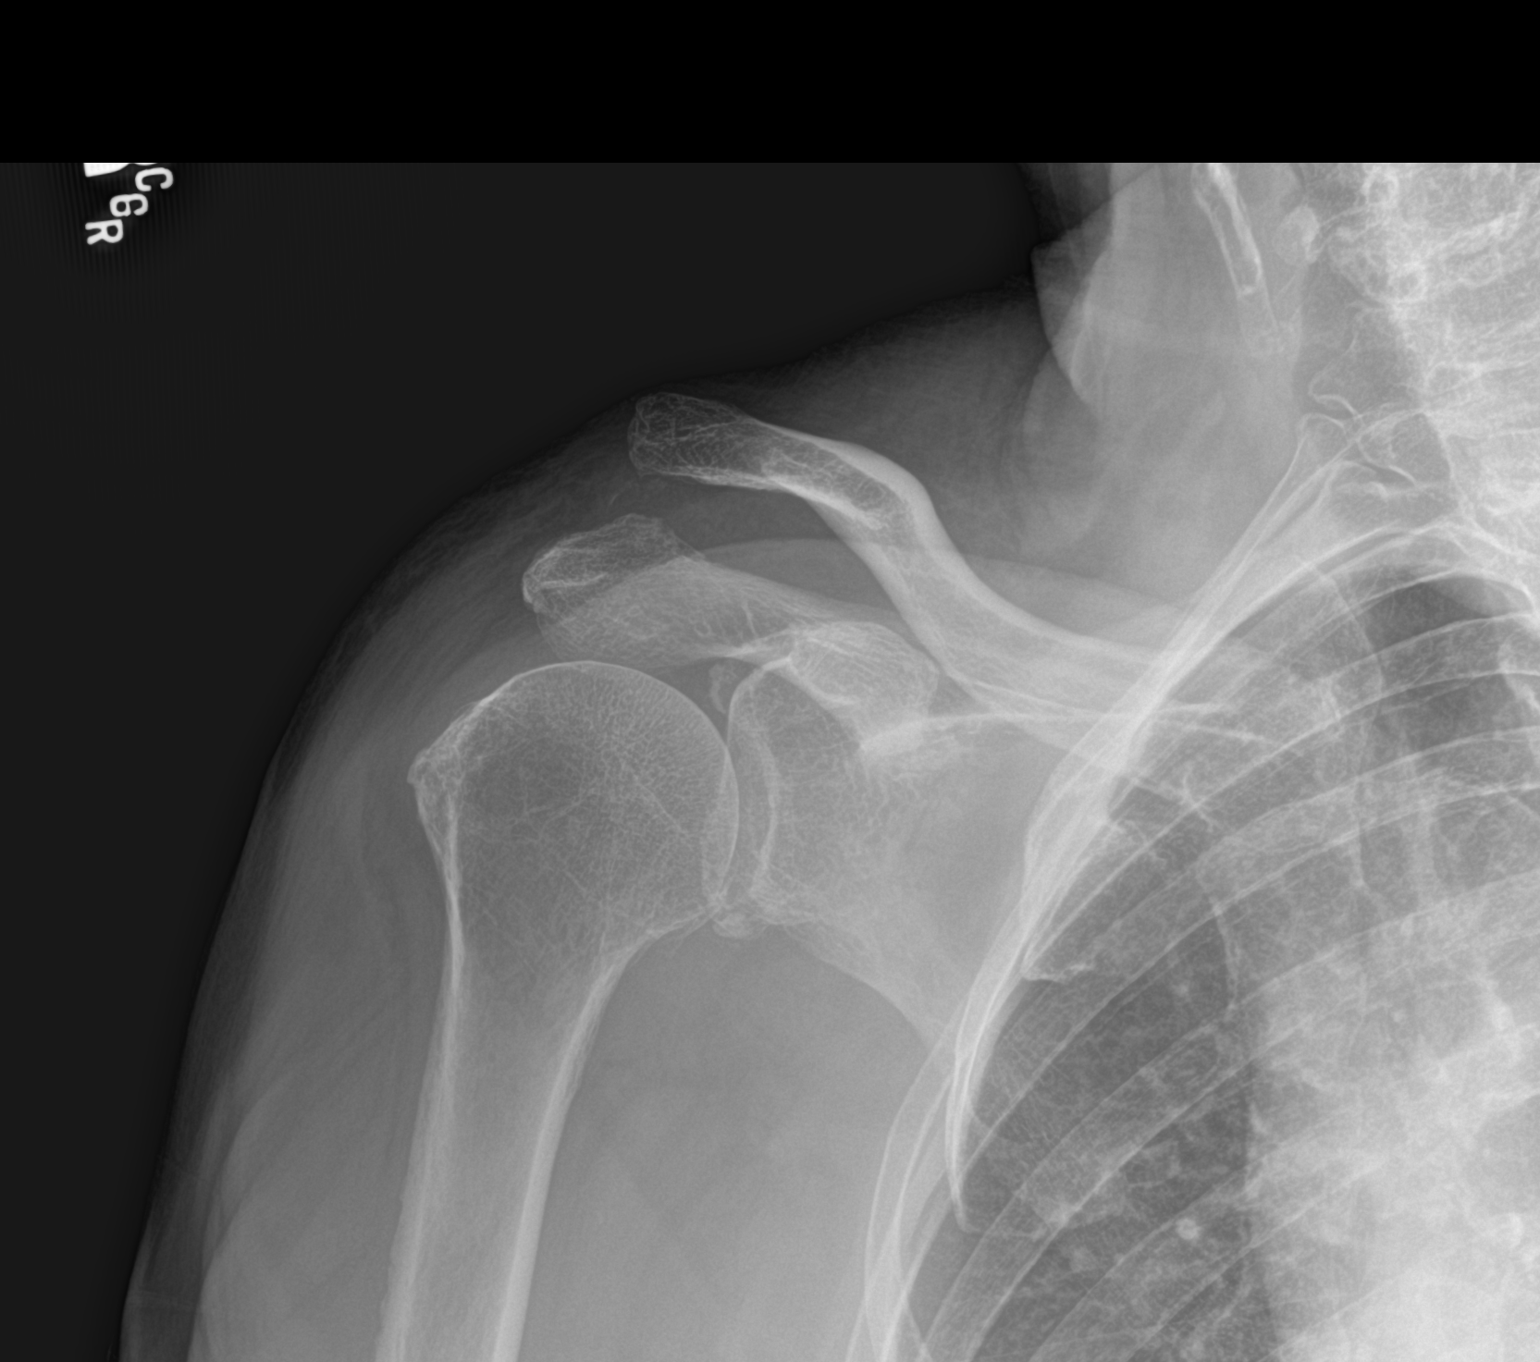

[3 of 3 positions shown; findings below may reference images not displayed]

FINDINGS: The right clavicle is elevated relative to the acromion consistent
with AC joint separation. No additional fracture or dislocation.
Visualized right lung and ribs are unremarkable. Irregularity of the
greater tuberosity indicative of rotator cuff tendinopathy. Mild
spurring of the glenohumeral joint.
IMPRESSION: Right acromioclavicular joint separation with distal clavicle
located 1.8 cm above the acromion.

## 2021-06-10 MED ORDER — IBUPROFEN 400 MG PO TABS
400.0000 mg | ORAL_TABLET | Freq: Three times a day (TID) | ORAL | Status: AC
  Administered 2021-06-10 – 2021-06-11 (×3): 400 mg via ORAL
  Filled 2021-06-10 (×3): qty 1

## 2021-06-10 NOTE — Progress Notes (Signed)
? ?[facility] ?PROGRESS NOTE ? ? ?MICHELENA CULMER MVH:[id number] DOB: April 09, 1945 DOA: 06/08/2021  2 ?DOS: the patient was seen and examined on 06/10/2021 ? ?PCP: Adin Hector, MD ? ?Brief History and Hospital Course:  ?76 y.o. female with medical history significant for hypothyroidism, hypertension, history of breast cancer status post partial mastectomy as well as radiation therapy and dementia who was brought into the ER by EMS for evaluation of weakness and frequent falls at home.  Patient was also complaining of pain in the right knee.  According to daughter patient has become increasingly forgetful and has had multiple falls at home.  She was hospitalized for further management. ? ?Consultants: Orthopedics ? ?Procedures: Arthrocentesis right knee ? ? ? ?Subjective: ?Patient pleasantly confused this morning.  Able to move her right knee better than before.  Denies any new complaints.   ? ?Assessment/Plan: ? ?* Fall ?Patient is status post fall and presents for evaluation of right knee pain and swelling as well as difficulty with ambulation. ?Apparently has been falling more than usual per daughter.  Likely due to progression of dementia.   ?Seen by PT.  Skilled nursing facility is recommended for rehabilitation. ? ?Right knee pain ?Patient with complaints of right knee pain and swelling following a fall and difficulty with ambulation. ?X ray shows moderate-sized joint effusion with articular surface irregularity of the posterolateral tibial plateau.  Tricompartment osteoarthritis and/or CPPD arthropathy.  CT of the knee does not show any fractures and continues to show joint effusion. ?Seen by orthopedics and underwent arthrocentesis on 3/18.  Patient does feel better with respect to pain in her right knee. ?Synovial fluid analysis reviewed.  It was a bloody fluid with the 35,305 WBCs.  No organisms noted on Gram stain.  This is likely inflammatory.  Intracellular calcium pyrophosphate crystals were  noted.  Likely pseudogout.  Could give her a trial of NSAIDs.  Renal function is normal. ? ?Dementia ([place]) ?Patient has a history of underlying depression and dementia. ?Continue donepezil, Celexa and buspirone. ?Per daughter patient has been more forgetful.   ?Dementia work-up was done which showed extremely low vitamin B12 level.  See below.  Folic acid level was normal.  ?RPR was nonreactive ?TSH mildly elevated at 6.5.  Free T4 normal at 0.94. ? ?Hypertension ?Holding HCTZ due to hypokalemia and hyponatremia.  ARB being continued.   ? ?Hypokalemia ?Hyponatremia ? ?Secondary to diuretics.  Potassium was repleted.  Levels noted to be normal today.  Magnesium 2.0.   ? ?Hypothyroidism ?Noted to be on Synthroid.  TSH elevated at 6.5.  Free T4 normal at 0.9.  Continue current dose of Synthroid.  Recheck labs in 3 to 4 weeks. ? ?History of osteoarthritis ?Continue as needed tramadol for pain control ? ?Invasive carcinoma of breast ([place]) ?Status post partial mastectomy as well as radiation therapy ?Continue tamoxifen ? ?Normocytic anemia ?Drop in hemoglobin is dilutional.  No evidence of overt bleeding.  Hemoglobin is stable. ? ?Vitamin B12 deficiency ?Replace B12 with IM injections initially and then transition to oral. ? ? ? ?DVT Prophylaxis: Lovenox ?Code Status: Full code ?Family Communication: No family at bedside.  We will update her daughter later today. ?Disposition Plan: SNF for rehab ? ?Status is: Inpatient ?Remains inpatient appropriate because: Inability to ambulate due to pain issues.  Unsafe discharge at this time ? ? ? ? ?Medications: Scheduled: ? atorvastatin  80 mg Oral Daily  ? busPIRone  7.5 mg Oral BID  ? cholecalciferol  1,000 Units Oral BID  ? citalopram  20 mg Oral Daily  ? cyanocobalamin  1,000 mcg Intramuscular Daily  ? Followed by  ? [START ON 06/14/2021] vitamin B-12  1,000 mcg Oral Daily  ? donepezil  5 mg Oral QHS  ? enoxaparin (LOVENOX) injection  40 mg Subcutaneous E75T  ? folic acid  1 mg  Oral Daily  ? levothyroxine  50 mcg Oral QAC breakfast  ? losartan  50 mg Oral Daily  ? multivitamin with minerals  1 tablet Oral Daily  ? tamoxifen  20 mg Oral Daily  ? thiamine  100 mg Oral Daily  ? Or  ? thiamine  100 mg Intravenous Daily  ? ?Continuous: ? ? ?ZGY:FVCBSWHQP **OR** LORazepam, morphine injection, ondansetron **OR** ondansetron (ZOFRAN) IV ? ?Antibiotics: ?Anti-infectives (From admission, onward)  ? ? None  ? ?  ? ? ?Objective: ? ?Vital Signs ? ?Vitals:  ? 06/09/21 0440 06/09/21 2003 06/10/21 0342 06/10/21 0835  ?BP: 130/77 135/81 (!) 142/87 129/76  ?Pulse: (!) 109 95 100 89  ?Resp: '16 20 18 18  '$ ?Temp: 99 ?F (37.2 ?C) 98.4 ?F (36.9 ?C) 98.1 ?F (36.7 ?C) 99 ?F (37.2 ?C)  ?TempSrc: Oral  Oral Oral  ?SpO2: 93% 96% 94% 95%  ?Weight:      ?Height:      ? ? ?Intake/Output Summary (Last 24 hours) at 06/10/2021 0954 ?Last data filed at 06/10/2021 0900 ?Gross per 24 hour  ?Intake 1860.75 ml  ?Output 450 ml  ?Net 1410.75 ml  ? ? ?Filed Weights  ? 06/08/21 0907  ?Weight: 63.5 kg  ? ? ?General appearance: Awake alert.  In no distress.  Distracted ?Resp: Clear to auscultation bilaterally.  Normal effort ?Cardio: S1-S2 is normal regular.  No S3-S4.  No rubs murmurs or bruit ?GI: Abdomen is soft.  Nontender nondistended.  Bowel sounds are present normal.  No masses organomegaly ?Extremities: Improved swelling of the right knee with improved range of motion. ?Neurologic:   No focal neurological deficits.  ? ? ? ?Lab Results: ? ?Data Reviewed: I have personally reviewed labs and imaging study reports ? ?CBC: ?Recent Labs  ?Lab 06/08/21 ?0948 06/09/21 ?5916 06/10/21 ?0452  ?WBC 10.9* 11.7* 9.1  ?HGB 12.2 11.6* 11.0*  ?HCT 35.8* 33.7* 31.8*  ?MCV 89.9 89.2 88.6  ?PLT 250 218 214  ? ? ? ?Basic Metabolic Panel: ?Recent Labs  ?Lab 06/08/21 ?0948 06/08/21 ?1514 06/09/21 ?3846 06/10/21 ?0452  ?NA 133*  --  131* 133*  ?K 3.3*  --  2.9* 4.2  ?CL 98  --  95* 104  ?CO2 25  --  27 24  ?GLUCOSE 125*  --  120* 152*  ?BUN 16  --  9  12  ?CREATININE 0.52  --  0.55 0.52  ?CALCIUM 8.7*  --  8.6* 8.5*  ?MG  --  1.7  --  2.0  ? ? ? ?GFR: ?Estimated Creatinine Clearance: 57.8 mL/min (by C-G formula based on SCr of 0.52 mg/dL). ? ?Liver Function Tests: ?Recent Labs  ?Lab 06/08/21 ?0948  ?AST 29  ?ALT 22  ?ALKPHOS 66  ?BILITOT 1.0  ?PROT 7.4  ?ALBUMIN 4.1  ? ? ? ? ?Cardiac Enzymes: ?Recent Labs  ?Lab 06/08/21 ?1514  ?CKTOTAL 145  ? ? ? ? ?Thyroid Function Tests: ?Recent Labs  ?  06/08/21 ?1514 06/10/21 ?0452  ?TSH 6.510*  --   ?FREET4  --  0.94  ? ? ? ?Anemia Panel: ?Recent Labs  ?  06/08/21 ?1514  ?[id number]  150*  ?FOLATE 6.0  ? ? ? ? ?Radiology Studies: ?DG Pelvis 1-2 Views ? ?Result Date: 06/08/2021 ?CLINICAL DATA:  Fall with weakness and injury. EXAM: PELVIS - 1-2 VIEW COMPARISON:  None. FINDINGS: Bones are demineralized. No fracture or dislocation. SI joints and symphysis pubis unremarkable. Mild degenerative changes noted in both hips. IMPRESSION: Negative. Electronically Signed   By: Misty Stanley M.D.   On: 06/08/2021 10:19  ? ?CT Head Wo Contrast ? ?Result Date: 06/08/2021 ?CLINICAL DATA:  Altered mental status EXAM: CT HEAD WITHOUT CONTRAST TECHNIQUE: Contiguous axial images were obtained from the base of the skull through the vertex without intravenous contrast. RADIATION DOSE REDUCTION: This exam was performed according to the departmental dose-optimization program which includes automated exposure control, adjustment of the mA and/or kV according to patient size and/or use of iterative reconstruction technique. COMPARISON:  None. FINDINGS: Brain: No acute intracranial findings are seen. Cortical sulci are prominent. There is no focal edema or mass effect. There are no epidural or subdural fluid collections. Vascular: Scattered arterial calcifications are seen. Skull: Unremarkable. Sinuses/Orbits: There is mucosal thickening in the right maxillary sinus. There is metallic density in the inferior aspect of right maxillary sinus, possibly part  of dental prostheses. There is mild mucosal thickening in the ethmoid sinus. Other: None IMPRESSION: No acute intracranial findings are seen in noncontrast CT brain. Atrophy. Chronic ethmoid and right m

## 2021-06-10 NOTE — Progress Notes (Signed)
Patient ID: Christy Braun, female   DOB: [birth year], 76 y.o.   MRN: [id number] ? ?Subjective: ?The patient notes that her right knee symptoms are much improved as compared to yesterday, and feels that the injection was quite beneficial.  She denies any pain in the knee while in bed, but notes that the knee was still somewhat sore when she was working with therapy earlier.  She denies any new complaints pertaining to her knee.  ? ?Objective: ?Vital signs in last 24 hours: ?Temp:  [98.1 ?F (36.7 ?C)-99 ?F (37.2 ?C)] 99 ?F (37.2 ?C) (03/19 1157) ?Pulse Rate:  [89-100] 89 (03/19 0835) ?Resp:  [18-20] 18 (03/19 0835) ?BP: (129-142)/(76-87) 129/76 (03/19 2620) ?SpO2:  [94 %-96 %] 95 % (03/19 0835) ? ?Intake/Output from previous day: ?03/18 0701 - 03/19 0700 ?In: 1860.8 [P.O.:477; I.V.:1383.8] ?Out: 200 [Urine:200] ?Intake/Output this shift: ?Total I/O ?In: -  ?Out: 250 [Urine:250] ? ?Recent Labs  ?  06/08/21 ?0948 06/09/21 ?3559 06/10/21 ?0452  ?HGB 12.2 11.6* 11.0*  ? ?Recent Labs  ?  06/09/21 ?7416 06/10/21 ?0452  ?WBC 11.7* 9.1  ?RBC 3.78* 3.59*  ?HCT 33.7* 31.8*  ?PLT 218 214  ? ?Recent Labs  ?  06/09/21 ?3845 06/10/21 ?0452  ?NA 131* 133*  ?K 2.9* 4.2  ?CL 95* 104  ?CO2 27 24  ?BUN 9 12  ?CREATININE 0.55 0.52  ?GLUCOSE 120* 152*  ?CALCIUM 8.6* 8.5*  ? ?No results for input(s): LABPT, INR in the last 72 hours. ? ?Physical Exam: ?Orthopedic examination is limited to the right knee and lower extremity.  The patient's swelling and warmth around the knee are much improved from yesterday, although she still exhibits a small effusion.  She has only minimal tenderness to palpation around the knee and can actively extend her knee to 0 degrees and flex beyond 90 degrees.  She moves quite freely in the bed.  She is neurovascularly intact to the right lower extremity and foot. ? ?Assessment: ?Acute right knee effusion secondary to gout ? ?Plan: ?The treatment options are reviewed with the patient.  She may continue to be  progressed with physical therapy, weightbearing as tolerated on the right leg.  She may require medication to treat gout upon discharge home.   ? ?Thank you for asking me to participate in the care of this most pleasant yet unfortunate woman.  I will sign off at this time.  If you have further need of orthopedic input during this hospitalization, please reconsult me.  She may follow-up with either Cameron Proud, PA-C, or myself in the office in 4 weeks if necessary. ? ? ?Marshall Cork Meshelle Holness ?06/10/2021, 3:15 PM  ? ? ? ? ?

## 2021-06-10 NOTE — Evaluation (Signed)
Occupational Therapy Evaluation ?Patient Details ?Name: Christy Braun ?MRN: [id number] ?DOB: [birth year] ?Today's Date: 06/10/2021 ? ? ?History of Present Illness Pt is a 76 y/o F admitted on 06/08/21 after presenting with c/o weakness & frequent falls at home & R knee pain. X ray shows moderate-sized joint effusion with articular surface irregularity of the posterolateral tibial plateau. Orthopedics was consulted & R knee was aspirated; pt is WBAT RLE. PMH: hypothyroidism, HTN, breast CA s/p partial mastectomy & radiation therapy, dementia  ? ?Clinical Impression ?  ?Pt seen for OT evaluation this date. Upon arrival to room, pt asleep in bed however easily awoken. Pt was alert and oriented to self, place, and month/year; disoriented to situation and repeating questions during session. No caregiver present to determine baseline cognitive functioning or PLOF. Pt reports that she is independent with ADLs at baseline. Per chart review, pt was driving and exercising 3x/week 1 month prior to admission. While sitting in long-sitting position in bed, pt required SET-UP assist for feeding, SET-UP assist for washing hands, and SUPERVISION for donning socks. Pt declined to participate in OOB mobility 2/2 dizziness while resting in bed (BP obtained 129/70; pt denied dizziness getting worse following supine>sit transfer); RN informed. Pt would benefit from additional skilled OT services to maximize return to PLOF and minimize risk of future falls, injury, caregiver burden, and readmission. Upon discharge, recommend SNF.    ? ?Recommendations for follow up therapy are one component of a multi-disciplinary discharge planning process, led by the attending physician.  Recommendations may be updated based on patient status, additional functional criteria and insurance authorization.  ? ?Follow Up Recommendations ? Skilled nursing-short term rehab (<3 hours/day)  ?  ?Assistance Recommended at Discharge Frequent or constant  Supervision/Assistance  ?Patient can return home with the following A lot of help with walking and/or transfers;A lot of help with bathing/dressing/bathroom ? ?  ?Functional Status Assessment ? Patient has had a recent decline in their functional status and demonstrates the ability to make significant improvements in function in a reasonable and predictable amount of time.  ?Equipment Recommendations ? Other (comment) (defer to next venue of care)  ?  ?   ?Precautions / Restrictions Precautions ?Precautions: Fall ?Restrictions ?Weight Bearing Restrictions: Yes ?RLE Weight Bearing: Weight bearing as tolerated  ? ?  ? ?Mobility Bed Mobility ?Overal bed mobility: Needs Assistance ?Bed Mobility: Supine to Sit ?  ?  ?Supine to sit: Supervision ?  ?  ?General bed mobility comments: supine>long sitting position in bed ?  ? ?Transfers ?  ?  ?  ?  ?  ?  ?  ?  ?  ?General transfer comment: pt declined to attempt 2/2 dizziness while at rest in bed ?  ? ?  ?Balance Overall balance assessment: Needs assistance ?Sitting-balance support: No upper extremity supported, Feet supported ?Sitting balance-Leahy Scale: Fair ?  ?  ?  ?  ?  ?  ?  ?  ?  ?  ?  ?  ?  ?  ?  ?  ?   ? ?ADL either performed or assessed with clinical judgement  ? ?ADL Overall ADL's : Needs assistance/impaired ?Eating/Feeding: Set up;Bed level (via long-sitting position in bed) ?  ?Grooming: Wash/dry hands;Set up;Bed level (via long-sitting position in bed) ?  ?  ?  ?  ?  ?  ?  ?Lower Body Dressing: Set up;Bed level (via long-sitting position in bed) ?Lower Body Dressing Details (indicate cue type and reason): Requires increased time/effort ?  ?  ?  ?  ?  ?  ?  ?   ? ? ? ?  Vision Ability to See in Adequate Light: 0 Adequate ?   ?   ?   ?   ? ?Pertinent Vitals/Pain Pain Assessment ?Pain Assessment: No/denies pain  ? ? ? ?   ?Extremity/Trunk Assessment Upper Extremity Assessment ?Upper Extremity Assessment: Generalized weakness ?  ?Lower Extremity Assessment ?Lower  Extremity Assessment: Generalized weakness ?  ?  ?  ?Communication Communication ?Communication: No difficulties ?  ?Cognition Arousal/Alertness: Awake/alert ?Behavior During Therapy: Flat affect ?Overall Cognitive Status: No family/caregiver present to determine baseline cognitive functioning ?  ?  ?  ?  ?  ?  ?  ?  ?  ?  ?  ?  ?  ?  ?  ?  ?General Comments: Pt alert and oriented to self, place, and month/year only. Requires increased processing time and demonstrate decreased short term memory (e.g., repeating same question about food) ?  ?  ?General Comments  pt c/o of dizziness while supine in bed (BP obtained; 129/70). Denied dizziness getting worse following supine>sit transfer ? ?  ?   ?   ? ? ?Home Living Family/patient expects to be discharged to:: Private residence ?Living Arrangements: Alone ?Available Help at Discharge: Family;Available PRN/intermittently ?Type of Home: House ?Home Access: Stairs to enter ?Entrance Stairs-Number of Steps: 4 ?Entrance Stairs-Rails: Right ?Home Layout: One level ?  ?  ?Bathroom Shower/Tub: Walk-in shower ?  ?  ?  ?  ?Home Equipment: Conservation officer, nature (2 wheels);Shower seat;Grab bars - tub/shower ?  ?  ?  ? ?  ?Prior Functioning/Environment Prior Level of Function : Driving;Independent/Modified Independent;History of Falls (last six months) ?  ?  ?  ?  ?  ?  ?Mobility Comments: Pt has been experiencing a functional decline over the past ~month. Prior to this pt was independent, driving, working out 3x/week. Pt reports 1-2 falls in the past 6 months but reports she gets down on floor & has difficulty getting back up, vs falling. Pt began using RW recently. ?ADLs Comments: pt reports she is independent with ADLs ?  ? ?  ?  ?OT Problem List: Decreased strength;Decreased activity tolerance;Impaired balance (sitting and/or standing);Decreased cognition ?  ?   ?OT Treatment/Interventions: Self-care/ADL training;Therapeutic exercise;DME and/or AE instruction;Therapeutic  activities;Patient/family education  ?  ?OT Goals(Current goals can be found in the care plan section) Acute Rehab OT Goals ?Patient Stated Goal: to feel better ?OT Goal Formulation: With patient ?Time For Goal Achievement: 06/24/21 ?Potential to Achieve Goals: Good ?ADL Goals ?Pt Will Perform Grooming: with min assist;standing ?Pt Will Transfer to Toilet: with min assist;ambulating;bedside commode ?Pt Will Perform Toileting - Clothing Manipulation and hygiene: with min assist;sitting/lateral leans  ?OT Frequency: Min 2X/week ?  ? ?   ?AM-PAC OT "6 Clicks" Daily Activity     ?Outcome Measure Help from another person eating meals?: None ?Help from another person taking care of personal grooming?: A Little ?Help from another person toileting, which includes using toliet, bedpan, or urinal?: A Lot ?Help from another person bathing (including washing, rinsing, drying)?: A Lot ?Help from another person to put on and taking off regular upper body clothing?: A Little ?Help from another person to put on and taking off regular lower body clothing?: A Little ?6 Click Score: 17 ?  ?End of Session Nurse Communication: Mobility status ? ?Activity Tolerance: Other (comment) (pt limited by dizziness at rest) ?Patient left: in bed;with call bell/phone within reach;with bed alarm set ? ?OT Visit Diagnosis: Muscle weakness (generalized) (M62.81)  ?              ?  Time: 6433-2951 ?OT Time Calculation (min): 22 min ?Charges:  OT General Charges ?$OT Visit: 1 Visit ?OT Evaluation ?$OT Eval Moderate Complexity: 1 Mod ?OT Treatments ?$Self Care/Home Management : 8-22 mins ? ?Fredirick Maudlin, OTR/L ?[place] ? ?

## 2021-06-10 NOTE — Plan of Care (Signed)
?  Problem: Clinical Measurements: ?Goal: Ability to maintain clinical measurements within normal limits will improve ?Outcome: Progressing ?Goal: Will remain free from infection ?Outcome: Progressing ?Goal: Diagnostic test results will improve ?Outcome: Progressing ?Goal: Respiratory complications will improve ?Outcome: Progressing ?Goal: Cardiovascular complication will be avoided ?Outcome: Progressing ?  ?Problem: Pain Managment: ?Goal: General experience of comfort will improve ?Outcome: Progressing ? ?Pt is alert and disorientedx3. Pt is also very forgetful. V/S stable. Ativan given for restlessness. ?

## 2021-06-11 ENCOUNTER — Inpatient Hospital Stay: Payer: PPO

## 2021-06-11 DIAGNOSIS — F0393 Unspecified dementia, unspecified severity, with mood disturbance: Secondary | ICD-10-CM | POA: Diagnosis not present

## 2021-06-11 LAB — BASIC METABOLIC PANEL WITH GFR
Anion gap: 6 (ref 5–15)
BUN: 14 mg/dL (ref 8–23)
CO2: 24 mmol/L (ref 22–32)
Calcium: 8.5 mg/dL — ABNORMAL LOW (ref 8.9–10.3)
Chloride: 101 mmol/L (ref 98–111)
Creatinine, Ser: 0.47 mg/dL (ref 0.44–1.00)
GFR, Estimated: >60 mL/min
Glucose, Bld: 125 mg/dL — ABNORMAL HIGH (ref 70–99)
Potassium: 3.6 mmol/L (ref 3.5–5.1)
Sodium: 131 mmol/L — ABNORMAL LOW (ref 135–145)

## 2021-06-11 LAB — CBC
HCT: 32.8 % — ABNORMAL LOW (ref 36.0–46.0)
Hemoglobin: 11.3 g/dL — ABNORMAL LOW (ref 12.0–15.0)
MCH: 30.9 pg (ref 26.0–34.0)
MCHC: 34.5 g/dL (ref 30.0–36.0)
MCV: 89.6 fL (ref 80.0–100.0)
Platelets: 268 10*3/uL (ref 150–400)
RBC: 3.66 MIL/uL — ABNORMAL LOW (ref 3.87–5.11)
RDW: 14.6 % (ref 11.5–15.5)
WBC: 10.9 10*3/uL — ABNORMAL HIGH (ref 4.0–10.5)
nRBC: 0 % (ref 0.0–0.2)

## 2021-06-11 MED ORDER — FOLIC ACID 1 MG PO TABS: 1.0000 mg | ORAL_TABLET | Freq: Every day | ORAL | Status: AC

## 2021-06-11 MED ORDER — CYANOCOBALAMIN 1000 MCG PO TABS: 1000.0000 ug | ORAL_TABLET | Freq: Every day | ORAL | Status: AC

## 2021-06-11 MED ORDER — LOSARTAN POTASSIUM 50 MG PO TABS
100.0000 mg | ORAL_TABLET | Freq: Every day | ORAL | Status: DC
  Administered 2021-06-12: 100 mg via ORAL
  Filled 2021-06-11: qty 2

## 2021-06-11 MED ORDER — LORAZEPAM 2 MG/ML IJ SOLN
0.5000 mg | Freq: Once | INTRAMUSCULAR | Status: AC
  Administered 2021-06-11: 0.5 mg via INTRAVENOUS
  Filled 2021-06-11: qty 1

## 2021-06-11 MED ORDER — THIAMINE HCL 100 MG PO TABS
100.0000 mg | ORAL_TABLET | Freq: Every day | ORAL | Status: AC
Start: 2021-06-12 — End: ?

## 2021-06-11 MED ORDER — MELATONIN 5 MG PO TABS
5.0000 mg | ORAL_TABLET | Freq: Every evening | ORAL | Status: DC | PRN
  Filled 2021-06-11: qty 1

## 2021-06-11 MED ORDER — LORAZEPAM 2 MG/ML IJ SOLN: 0.5000 mg | Freq: Once | INTRAMUSCULAR | Status: DC | PRN

## 2021-06-11 MED ORDER — LOSARTAN POTASSIUM 100 MG PO TABS: 100.0000 mg | ORAL_TABLET | Freq: Every day | ORAL | Status: AC

## 2021-06-11 MED ORDER — POTASSIUM CHLORIDE CRYS ER 20 MEQ PO TBCR
40.0000 meq | EXTENDED_RELEASE_TABLET | Freq: Once | ORAL | Status: AC
  Administered 2021-06-11: 40 meq via ORAL
  Filled 2021-06-11: qty 2

## 2021-06-11 NOTE — Discharge Summary (Signed)
?[facility] ? ?Physician Discharge Summary  ? ?Patient ID: ?Christy Braun ?MRN: [id number] ?DOB/AGE: [birth year] 75 y.o. ? ?Admit date: 06/08/2021 ?Discharge date:   06/11/2021 ? ? ?PCP: Adin Hector, MD ? ?DISCHARGE DIAGNOSES:  ?Principal Problem: ?  Fall ?Active Problems: ?  Right knee pain ?  Dementia ([place]) ?  Hypertension ?  Hypokalemia ?  Hypothyroidism ?  History of osteoarthritis ?  Invasive carcinoma of breast ([place]) ?  Vitamin B12 deficiency ?  Normocytic anemia ?  Pressure injury of skin ? ? ? ?RECOMMENDATIONS FOR OUTPATIENT FOLLOW UP: ?Check thyroid function test in 3 to 4 weeks ?Check CBC and basic metabolic panel in 1 week ? ? ?Home Health: SNF ?Equipment/Devices: None ? ?CODE STATUS: Full code ? ?DISCHARGE CONDITION: fair ? ?Diet recommendation: Low-sodium diet ? ?INITIAL HISTORY: ?76 y.o. female with medical history significant for hypothyroidism, hypertension, history of breast cancer status post partial mastectomy as well as radiation therapy and dementia who was brought into the ER by EMS for evaluation of weakness and frequent falls at home.  Patient was also complaining of pain in the right knee.  According to daughter patient has become increasingly forgetful and has had multiple falls at home.  She was hospitalized for further management. ? ?Consultations: ?Orthopedics, Dr. Roland Rack ? ?Procedures: ?Arthrocentesis  ? ? ?HOSPITAL COURSE:  ? ?* Fall ?Patient is status post fall and presents for evaluation of right knee pain and swelling as well as difficulty with ambulation. ?Apparently has been falling more than usual per daughter.  Likely due to progression of dementia.   ?Seen by PT.  Skilled nursing facility is recommended for rehabilitation.  Patient is medically stable for discharge. ? ?Right knee pain ?Patient with complaints of right knee pain and swelling following a fall and difficulty with ambulation. ?X ray shows moderate-sized joint effusion with articular surface irregularity  of the posterolateral tibial plateau.  Tricompartment osteoarthritis and/or CPPD arthropathy.  CT of the knee does not show any fractures and continues to show joint effusion. ?Seen by orthopedics and underwent arthrocentesis on 3/18.  Patient does feel better with respect to pain in her right knee. ?Synovial fluid analysis reviewed.  It was a bloody fluid with the 35,305 WBCs.  No organisms noted on Gram stain.  This is likely inflammatory.  Intracellular calcium pyrophosphate crystals were noted.  Likely pseudogout.  Cultures negative. ?Symptoms have improved.  She was given Motrin since renal function was normal.  If she has recurrence of her symptoms Motrin could be again administered.  If she has persistent symptoms despite Motrin then would either try colchicine or steroid though would hesitate to use steroids due to her dementia.   ? ?Dementia ([place]) ?Patient has a history of underlying depression and dementia. ?Continue donepezil, Celexa and buspirone. ?Per daughter patient has been more forgetful.   ?Dementia work-up was done which showed extremely low vitamin B12 level.  See below.  Folic acid level was normal.  ?RPR was nonreactive ?TSH mildly elevated at 6.5.  Free T4 normal at 0.94. ? ?Hypertension ?Holding HCTZ due to hypokalemia and hyponatremia.  Dose of ARB increased and will be continued at discharge.  HCTZ to be discontinued. ? ?Hypokalemia ?Hyponatremia ? ?Secondary to diuretics.  Potassium was repleted.  Sodium level is low but stable. ? ?Hypothyroidism ?Noted to be on Synthroid.  TSH elevated at 6.5.  Free T4 normal at 0.9.  Continue current dose of Synthroid.  Recheck labs in 3 to 4 weeks. ? ?  History of osteoarthritis ?Pseudogout based on synovial fluid analysis ? ?See discussion under knee pain. ? ?Invasive carcinoma of breast ([place]) ?Status post partial mastectomy as well as radiation therapy ?Continue tamoxifen ? ?Normocytic anemia ?Drop in hemoglobin is dilutional.  No evidence of overt  bleeding.  Hemoglobin is stable. ? ?Vitamin B12 deficiency ?Replace B12 with IM injections initially and then transition to oral. ? ? ? ?Stage I pressure injury right and left ischial tuberosity and bilateral heels ?Pressure Injury 06/08/21 Ischial tuberosity Right Stage 1 -  Intact skin with non-blanchable redness of a localized area usually over a bony prominence. (Active)  ?06/08/21 1530  ?Location: Ischial tuberosity  ?Location Orientation: Right  ?Staging: Stage 1 -  Intact skin with non-blanchable redness of a localized area usually over a bony prominence.  ?Wound Description (Comments):   ?Present on Admission: Yes  ?   ?Pressure Injury 06/08/21 Ischial tuberosity Left Stage 1 -  Intact skin with non-blanchable redness of a localized area usually over a bony prominence. (Active)  ?06/08/21 1530  ?Location: Ischial tuberosity  ?Location Orientation: Left  ?Staging: Stage 1 -  Intact skin with non-blanchable redness of a localized area usually over a bony prominence.  ?Wound Description (Comments):   ?Present on Admission: Yes  ?   ?Pressure Injury 06/08/21 Heel Bilateral Stage 1 -  Intact skin with non-blanchable redness of a localized area usually over a bony prominence. (Active)  ?06/08/21 1530  ?Location: Heel  ?Location Orientation: Bilateral  ?Staging: Stage 1 -  Intact skin with non-blanchable redness of a localized area usually over a bony prominence.  ?Wound Description (Comments):   ?Present on Admission: Yes  ? ? ?Patient is stable.  Okay for discharge to SNF when bed is available. ? ? ?PERTINENT LABS: ? ?The results of significant diagnostics from this hospitalization (including imaging, microbiology, ancillary and laboratory) are listed below for reference.   ? ?Microbiology: ?Recent Results (from the past 240 hour(s))  ?Body fluid culture w Gram Stain     Status: None (Preliminary result)  ? Collection Time: 06/09/21 11:18 AM  ? Specimen: Synovium; Body Fluid  ?Result Value Ref Range Status  ?  Specimen Description   Final  ?  SYNOVIAL ?Performed at [facility], [address] [place], [place] [address] ?  ? Special Requests   Final  ?  RIGHT KNEE JOINT FLUID ?Performed at [facility], [address] [place], [place] [address] ?  ? Gram Stain   Final  ?  MODERATE WBC PRESENT, PREDOMINANTLY PMN ?NO ORGANISMS SEEN ?  ? Culture   Final  ?  NO GROWTH 2 DAYS ?Performed at [facility], [place] [address] [place], [place] [address] ?  ? Report Status PENDING  Incomplete  ?  ? ?Labs: ? ? ? ? ?Basic Metabolic Panel: ?Recent Labs  ?Lab 06/08/21 ?0948 06/08/21 ?1514 06/09/21 ?1314 06/10/21 ?3888 06/11/21 ?0439  ?NA 133*  --  131* 133* 131*  ?K 3.3*  --  2.9* 4.2 3.6  ?CL 98  --  95* 104 101  ?CO2 25  --  '27 24 24  '$ ?GLUCOSE 125*  --  120* 152* 125*  ?BUN 16  --  '9 12 14  '$ ?CREATININE 0.52  --  0.55 0.52 0.47  ?CALCIUM 8.7*  --  8.6* 8.5* 8.5*  ?MG  --  1.7  --  2.0  --   ? ?Liver Function Tests: ?Recent Labs  ?Lab 06/08/21 ?0948  ?AST 29  ?  ALT 22  ?ALKPHOS 66  ?BILITOT 1.0  ?PROT 7.4  ?ALBUMIN 4.1  ? ? ?CBC: ?Recent Labs  ?Lab 06/08/21 ?0948 06/09/21 ?4888 06/10/21 ?9169 06/11/21 ?0439  ?WBC 10.9* 11.7* 9.1 10.9*  ?HGB 12.2 11.6* 11.0* 11.3*  ?HCT 35.8* 33.7* 31.8* 32.8*  ?MCV 89.9 89.2 88.6 89.6  ?PLT 250 218 214 268  ? ?Cardiac Enzymes: ?Recent Labs  ?Lab 06/08/21 ?1514  ?CKTOTAL 145  ? ? ? ?IMAGING STUDIES ?DG Pelvis 1-2 Views ? ?Result Date: 06/08/2021 ?CLINICAL DATA:  Fall with weakness and injury. EXAM: PELVIS - 1-2 VIEW COMPARISON:  None. FINDINGS: Bones are demineralized. No fracture or dislocation. SI joints and symphysis pubis unremarkable. Mild degenerative changes noted in both hips. IMPRESSION: Negative. Electronically Signed   By: Misty Stanley M.D.   On: 06/08/2021 10:19  ? ?CT Head Wo Contrast ? ?Result Date: 06/08/2021 ?CLINICAL DATA:  Altered mental status EXAM: CT HEAD WITHOUT CONTRAST TECHNIQUE: Contiguous axial images were obtained from the base of the skull through the  vertex without intravenous contrast. RADIATION DOSE REDUCTION: This exam was performed according to the departmental dose-optimization program which includes automated exposure control, adjustment of the mA an

## 2021-06-11 NOTE — Care Management Important Message (Signed)
Important Message ? ?Patient Details  ?Name: Christy Braun ?MRN: [id number] ?Date of Birth: [birth year] ? ? ?Medicare Important Message Given:  Yes ? ?Patient asleep upon time of visit.  Copy of Medicare IM left on room for reference.  ? ? ?Dannette Barbara ?06/11/2021, 11:01 AM ?

## 2021-06-11 NOTE — TOC Progression Note (Signed)
Transition of Care (TOC) - Progression Note  ? ? ?Patient Details  ?Name: KAMREN HESKETT ?MRN: [id number] ?Date of Birth: [birth year] ? ?Transition of Care (TOC) CM/SW Contact  ?Beverly Sessions, RN ?Phone Number: ?06/11/2021, 3:14 PM ? ?Clinical Narrative:    ? ?Bed offer presented Patient to deferred decision to daughter Lattie Haw.  [facility]   ?Accepted bed in [facility] and notified [facility]  ? ? ? ?Spoke with Tammy at [facility] and started [place].  ?Patient ambulating 60 feet.  Will not meet criteria for EMS transport ? ?Expected Discharge Plan: [place] ?Barriers to Discharge: Continued Medical Work up ? ?Expected Discharge Plan and Services ?Expected Discharge Plan: [place] ?  ?  ?  ?Living arrangements for the past 2 months: [place] ?Expected Discharge Date: 06/11/21               ?  ?  ?  ?  ?  ?  ?  ?  ?  ?  ? ? ?Social Determinants of Health (SDOH) Interventions ?  ? ?Readmission Risk Interventions ?Readmission Risk Prevention Plan 06/09/2021  ?Transportation Screening Complete  ?PCP or Specialist Appt within 5-7 Days Complete  ?Home Care Screening Complete  ?Medication Review (RN CM) Complete  ?Some recent data might be hidden  ? ? ?

## 2021-06-11 NOTE — Progress Notes (Signed)
1430 ? ?Notified by PT about patient having foot drop to left side. Minette Brine, RN was there to assess, and I made my way back to the floor from the cafeteria to assist as well. Patient's vital signs were stable, showed no obvious signs of stroke like activity. Patient admitted that her left side had been weaker than usual prior to admission. Dr. Maryland Pink was notified, and assessed the patient and ordered an MRI to double check everything.  ?

## 2021-06-11 NOTE — Progress Notes (Signed)
Pt more confused, trying to get out of bed, she thinks she is at home, very anxious. Ativan given as per CIWA scale. ?

## 2021-06-11 NOTE — Progress Notes (Signed)
Physical Therapy Treatment ?Patient Details ?Name: Christy Braun ?MRN: [id number] ?DOB: [birth year] ?Today's Date: 06/11/2021 ? ? ?History of Present Illness Pt is a 76 y/o F admitted on 06/08/21 after presenting with c/o weakness & frequent falls at home & R knee pain. X ray shows moderate-sized joint effusion with articular surface irregularity of the posterolateral tibial plateau. Orthopedics was consulted & R knee was aspirated; pt is WBAT RLE. PMH: hypothyroidism, HTN, breast CA s/p partial mastectomy & radiation therapy, dementia ? ?  ?PT Comments  ? ? Pt seen for PT tx with pt's daughter present. Pt doesn't recall events of session after working with PT this weekend. Pt is able to complete supine<>sit with supervision with bed rails, still requiring cuing to completely scoot to sitting EOB. Pt completes sit>stand with supervision & ambulates into hallway with RW & min assist. Pt is able to increase gait distances but upon standing & during gait pt c/o LLE feeling like it's "asleep". PT then observed pt to have absent dorsiflexion & compensating with excessive LLE hip/knee flexion during swing phase. Pt c/o increasing LLE fatigue so pt assisted back to room. While sitting EOB pt unable to dorsiflex LLE (1/5 MMT at most). PT did not observe this during last PT session & nurse Minette Brine) called to room to assess & MD notified via secure chat & in to assess pt at end of session. ? ?Of note, pt was living alone, independent & going to the gym 3x/week prior to admission. Pt/family report pt has inability to get up ~2 times on Thursday night & that's when EMS was called. Pt's daughter unsure of "dragging" left foot was present that Thursday night but reports pt has seem to experienced a sudden decline since that day.  ?  ?Recommendations for follow up therapy are one component of a multi-disciplinary discharge planning process, led by the attending physician.  Recommendations may be updated based on patient status,  additional functional criteria and insurance authorization. ? ?Follow Up Recommendations ? Skilled nursing-short term rehab (<3 hours/day) ?  ?  ?Assistance Recommended at Discharge Frequent or constant Supervision/Assistance  ?Patient can return home with the following A lot of help with bathing/dressing/bathroom;Assistance with cooking/housework;Assist for transportation;Direct supervision/assist for financial management;Help with stairs or ramp for entrance;Direct supervision/assist for medications management;A little help with walking and/or transfers ?  ?Equipment Recommendations ? Rolling walker (2 wheels);BSC/3in1  ?  ?Recommendations for Other Services   ? ? ?  ?Precautions / Restrictions Precautions ?Precautions: Fall ?Restrictions ?Weight Bearing Restrictions: Yes ?RLE Weight Bearing: Weight bearing as tolerated  ?  ? ?Mobility ? Bed Mobility ?Overal bed mobility: Needs Assistance ?Bed Mobility: Supine to Sit, Sit to Supine ?  ?  ?Supine to sit: Supervision, HOB elevated ?Sit to supine: Supervision, HOB elevated ?  ?General bed mobility comments: use of bed rails ?  ? ?Transfers ?Overall transfer level: Needs assistance ?Equipment used: Rolling walker (2 wheels) ?Transfers: Sit to/from Stand ?Sit to Stand: Supervision ?  ?  ?  ?  ?  ?  ?  ? ?Ambulation/Gait ?Ambulation/Gait assistance: Min assist ?Gait Distance (Feet): 60 Feet ?Assistive device: Rolling walker (2 wheels) ?Gait Pattern/deviations: Decreased step length - right, Decreased step length - left, Decreased stride length, Decreased dorsiflexion - right, Decreased dorsiflexion - left, Step-through pattern ?Gait velocity: decreased ?  ?  ?General Gait Details: Absent dorsiflexion LLE & pt with excessive L hip/knee flexion during swing phase to compensate. Pt c/o LLE weakness that worsens with gait distance. ? ? ?  Stairs ?  ?  ?  ?  ?  ? ? ?Wheelchair Mobility ?  ? ?Modified Rankin (Stroke Patients Only) ?  ? ? ?  ?Balance Overall balance assessment:  Needs assistance ?Sitting-balance support: No upper extremity supported, Feet supported ?Sitting balance-Leahy Scale: Fair ?  ?  ?Standing balance support: During functional activity, Bilateral upper extremity supported, Reliant on assistive device for balance ?Standing balance-Leahy Scale: Fair ?  ?  ?  ?  ?  ?  ?  ?  ?  ?  ?  ?  ?  ? ?  ?Cognition Arousal/Alertness: Awake/alert ?Behavior During Therapy: [facility] for tasks assessed/performed ?  ?  ?  ?  ?  ?  ?  ?  ?  ?  ?  ?  ?  ?  ?  ?  ?  ?General Comments: Decreased processing, decreased awareness ?  ?  ? ?  ?Exercises   ? ?  ?General Comments General comments (skin integrity, edema, etc.): L buttocks reddened; left pt in L side lying with pillow wedged to keep pt in side lying for pressure relief ?  ?  ? ?Pertinent Vitals/Pain Pain Assessment ?Pain Assessment: No/denies pain  ? ? ?Home Living   ?  ?  ?  ?  ?  ?  ?  ?  ?  ?   ?  ?Prior Function    ?  ?  ?   ? ?PT Goals (current goals can now be found in the care plan section) Acute Rehab PT Goals ?Patient Stated Goal: decreased pain ?PT Goal Formulation: With patient ?Time For Goal Achievement: 06/23/21 ?Potential to Achieve Goals: Good ?Progress towards PT goals: Progressing toward goals ? ?  ?Frequency ? ? ? Min 2X/week ? ? ? ?  ?PT Plan Current plan remains appropriate  ? ? ?Co-evaluation   ?  ?  ?  ?  ? ?  ?AM-PAC PT "6 Clicks" Mobility   ?Outcome Measure ? Help needed turning from your back to your side while in a flat bed without using bedrails?: None ?Help needed moving from lying on your back to sitting on the side of a flat bed without using bedrails?: A Little ?Help needed moving to and from a bed to a chair (including a wheelchair)?: A Little ?Help needed standing up from a chair using your arms (e.g., wheelchair or bedside chair)?: A Little ?Help needed to walk in hospital room?: A Little ?Help needed climbing 3-5 steps with a railing? : A Lot ?6 Click Score: 18 ? ?  ?End of Session Equipment Utilized  During Treatment: Gait belt ?Activity Tolerance: Treatment limited secondary to medical complications (Comment) ?Patient left: in bed;with call bell/phone within reach;with bed alarm set;with family/visitor present;with nursing/sitter in room ?Nurse Communication: Mobility status ?PT Visit Diagnosis: Unsteadiness on feet (R26.81);Muscle weakness (generalized) (M62.81);Difficulty in walking, not elsewhere classified (R26.2);Other abnormalities of gait and mobility (R26.89) ?  ? ? ?Time: 1771-1657 ?PT Time Calculation (min) (ACUTE ONLY): 16 min ? ?Charges:  $Therapeutic Activity: 8-22 mins          ?          ? ?Lavone Nian, PT, DPT ?06/11/21, 3:03 PM ? ? ? ?Waunita Schooner ?06/11/2021, 3:00 PM ? ?

## 2021-06-11 NOTE — Progress Notes (Signed)
Occupational Therapy Treatment ?Patient Details ?Name: Christy Braun ?MRN: [id number] ?DOB: June 18, 1945 ?Today's Date: 06/11/2021 ? ? ?History of present illness Pt is a 76 y/o F admitted on 06/08/21 after presenting with c/o weakness & frequent falls at home & R knee pain. X ray shows moderate-sized joint effusion with articular surface irregularity of the posterolateral tibial plateau. Orthopedics was consulted & R knee was aspirated; pt is WBAT RLE. PMH: hypothyroidism, HTN, breast CA s/p partial mastectomy & radiation therapy, dementia ?  ?OT comments ? Pt in bed upon OT arrival and requested assist to get her teeth brushed.  Pt tolerated grooming in standing at sink with min guard and unilateral support to brush teeth and hair.  Pt took seated rest break x1 min in prep for ambulation to bathroom for toileting.  Pt tolerated amb to bathroom for toileting; see below for details.  Pt mentioned she needs to get a 3in1 for home to put over her toilet to make it easier to get up.  OT updated DME recommendations to include 3in1, as pt does require elevated seat with support of arm rests to control ascent/descent to commode.  Pt with no c/o pain this day at rest or activity.  Pt declined sitting up upon completion of session, so OT assisted pt to L side lying d/t L buttock reddened.  Pillow wedged beneath pt to maintain side lying for pressure relief.  Left pt with all needs met, call light and needed supplies within reach, and bed alarm set.  Pt will benefit from additional skilled OT services to maximize return to PLOF and minimize risk of future falls, injury, caregiver burden, and readmission. ?  ? ?Recommendations for follow up therapy are one component of a multi-disciplinary discharge planning process, led by the attending physician.  Recommendations may be updated based on patient status, additional functional criteria and insurance authorization. ?   ?Follow Up Recommendations ? Skilled nursing-short term rehab  (<3 hours/day)  ?  ?Assistance Recommended at Discharge Frequent or constant Supervision/Assistance  ?Patient can return home with the following ? A little help with walking and/or transfers;A little help with bathing/dressing/bathroom;Assistance with cooking/housework;Direct supervision/assist for medications management;Help with stairs or ramp for entrance;Assist for transportation ?  ?Equipment Recommendations ? BSC/3in1  ?  ?Recommendations for Other Services   ? ?  ?Precautions / Restrictions Precautions ?Precautions: Fall ?Restrictions ?Weight Bearing Restrictions: Yes ?RLE Weight Bearing: Weight bearing as tolerated  ? ? ?  ? ?Mobility Bed Mobility ?Overal bed mobility: Needs Assistance ?Bed Mobility: Supine to Sit, Sit to Supine ?  ?  ?Supine to sit: Supervision ?Sit to supine: Supervision ?  ?  ?Patient Response: Cooperative ? ?Transfers ?Overall transfer level: Needs assistance ?Equipment used: Rolling walker (2 wheels) ?Transfers: Sit to/from Stand, Bed to chair/wheelchair/BSC ?Sit to Stand: Min assist ?  ?  ?Step pivot transfers: Min assist ?  ?  ?General transfer comment: Pt reported initial mild dizziness initially while standing at sink.  Pt denied need to sit down and was able to tolerate standing ADLs without worsening dizziness and min guard-min A with at least unilateral support. ?  ?  ?Balance Overall balance assessment: Needs assistance ?Sitting-balance support: No upper extremity supported, Feet supported ?Sitting balance-Leahy Scale: Fair ?  ?  ?Standing balance support: During functional activity, Bilateral upper extremity supported, Reliant on assistive device for balance ?Standing balance-Leahy Scale: Poor ?Standing balance comment: Min A to maintain balance while pt washed hands; otherwise min guard with unilateral support for  brushing hair and teeth. ?  ?  ?  ?  ?  ?  ?  ?  ?  ?  ?  ?   ? ?ADL either performed or assessed with clinical judgement  ? ?ADL Overall ADL's : Needs  assistance/impaired ?  ?  ?Grooming: Wash/dry hands;Brushing hair;Oral care;Min guard;Standing ?  ?  ?  ?  ?  ?  ?  ?  ?  ?Toilet Transfer: Minimal assistance;Ambulation;Rolling walker (2 wheels);Grab bars ?Toilet Transfer Details (indicate cue type and reason): cues for hand placement on walker and grab bar; used bathroom toilet. ?Toileting- Clothing Manipulation and Hygiene: Minimal assistance;Sit to/from stand ?Toileting - Clothing Manipulation Details (indicate cue type and reason): set up for toilet hygiene while seated; min A for clothing management to maintain balance while standing ?  ?  ?Functional mobility during ADLs: Minimal assistance;Rolling walker (2 wheels) ?General ADL Comments: ambulated from bedside<>bathroom for toileting.  Assisted back to bed per pt request upon completion of ADLs. ?  ? ?Extremity/Trunk Assessment Upper Extremity Assessment ?Upper Extremity Assessment: Generalized weakness ?  ?Lower Extremity Assessment ?Lower Extremity Assessment: Generalized weakness ?  ?Cervical / Trunk Assessment ?Cervical / Trunk Assessment: Normal ?  ? ?Vision Patient Visual Report: No change from baseline ?  ?  ?   ?  ?   ?  ? ?Cognition Arousal/Alertness: Awake/alert ?Behavior During Therapy: [facility] for tasks assessed/performed ?Overall Cognitive Status: No family/caregiver present to determine baseline cognitive functioning ?  ?  ?  ?  ?  ?  ?  ?  ?  ?  ?  ?  ?  ?  ?  ?  ?General Comments: impaired STM; pt asked same questions repeatedly throughout session ?  ?  ?   ?   ? ?  ?   ? ? ?  ?General Comments L buttocks reddened; left pt in L side lying with pillow wedged to keep pt in side lying for pressure relief  ? ? ?Pertinent Vitals/ Pain       Pain Assessment ?Pain Assessment: No/denies pain ?Pain Location: Pt reported no pain with sitting or standing activity; monitored throughout session ?Pain Intervention(s): Monitored during session, Repositioned ? ?Home Living   ?  ?  ?  ?  ?  ?  ?  ?  ?  ?  ?  ?  ?   ?  ?  ?  ?  ?  ? ?  ?Prior Functioning/Environment    ?  ?  ?  ?   ? ?Frequency ? Min 2X/week  ? ? ? ? ?  ?Progress Toward Goals ? ?OT Goals(current goals can now be found in the care plan section) ? Progress towards OT goals: Progressing toward goals ? ?Acute Rehab OT Goals ?Patient Stated Goal: to feel better ?OT Goal Formulation: With patient ?Time For Goal Achievement: 06/24/21 ?Potential to Achieve Goals: Good  ?Plan Discharge plan remains appropriate   ? ? ? ? ?   ?  ?  ?  ?  ? ?  ?AM-PAC OT "6 Clicks" Daily Activity     ?Outcome Measure ? ? Help from another person eating meals?: None ?Help from another person taking care of personal grooming?: A Little ?Help from another person toileting, which includes using toliet, bedpan, or urinal?: A Little ?Help from another person bathing (including washing, rinsing, drying)?: A Little ?Help from another person to put on and taking off regular upper body clothing?: A Little ?Help from another  person to put on and taking off regular lower body clothing?: A Little ?6 Click Score: 19 ? ?  ?End of Session Equipment Utilized During Treatment: Gait belt;Rolling walker (2 wheels) ? ?OT Visit Diagnosis: Muscle weakness (generalized) (M62.81) ?  ?Activity Tolerance Patient tolerated treatment well ?  ?Patient Left in bed;with call bell/phone within reach;with bed alarm set ?  ?   ?  ? ?   ? ?Time: 1594-5859 ?OT Time Calculation (min): 31 min ? ?Charges: OT General Charges ?$OT Visit: 1 Visit ?OT Treatments ?$Self Care/Home Management : 23-37 mins ? ?Leta Speller, MS, OTR/L ? ? ?Darleene Cleaver ?06/11/2021, 12:35 PM ?

## 2021-06-12 DIAGNOSIS — F0393 Unspecified dementia, unspecified severity, with mood disturbance: Secondary | ICD-10-CM | POA: Diagnosis not present

## 2021-06-12 DIAGNOSIS — R296 Repeated falls: Secondary | ICD-10-CM | POA: Diagnosis not present

## 2021-06-12 DIAGNOSIS — R2681 Unsteadiness on feet: Secondary | ICD-10-CM | POA: Diagnosis not present

## 2021-06-12 DIAGNOSIS — M21372 Foot drop, left foot: Secondary | ICD-10-CM

## 2021-06-12 DIAGNOSIS — R52 Pain, unspecified: Secondary | ICD-10-CM | POA: Diagnosis not present

## 2021-06-12 DIAGNOSIS — E785 Hyperlipidemia, unspecified: Secondary | ICD-10-CM | POA: Diagnosis not present

## 2021-06-12 DIAGNOSIS — F4323 Adjustment disorder with mixed anxiety and depressed mood: Secondary | ICD-10-CM | POA: Diagnosis not present

## 2021-06-12 DIAGNOSIS — C50919 Malignant neoplasm of unspecified site of unspecified female breast: Secondary | ICD-10-CM | POA: Diagnosis not present

## 2021-06-12 DIAGNOSIS — R41841 Cognitive communication deficit: Secondary | ICD-10-CM | POA: Diagnosis not present

## 2021-06-12 DIAGNOSIS — D519 Vitamin B12 deficiency anemia, unspecified: Secondary | ICD-10-CM | POA: Diagnosis not present

## 2021-06-12 DIAGNOSIS — F419 Anxiety disorder, unspecified: Secondary | ICD-10-CM | POA: Diagnosis not present

## 2021-06-12 DIAGNOSIS — F32A Depression, unspecified: Secondary | ICD-10-CM | POA: Diagnosis not present

## 2021-06-12 DIAGNOSIS — Z736 Limitation of activities due to disability: Secondary | ICD-10-CM | POA: Diagnosis not present

## 2021-06-12 DIAGNOSIS — I10 Essential (primary) hypertension: Secondary | ICD-10-CM | POA: Diagnosis not present

## 2021-06-12 DIAGNOSIS — F0390 Unspecified dementia without behavioral disturbance: Secondary | ICD-10-CM | POA: Diagnosis not present

## 2021-06-12 DIAGNOSIS — E876 Hypokalemia: Secondary | ICD-10-CM | POA: Diagnosis not present

## 2021-06-12 DIAGNOSIS — R110 Nausea: Secondary | ICD-10-CM | POA: Diagnosis not present

## 2021-06-12 DIAGNOSIS — M25561 Pain in right knee: Secondary | ICD-10-CM | POA: Diagnosis not present

## 2021-06-12 DIAGNOSIS — Z8739 Personal history of other diseases of the musculoskeletal system and connective tissue: Secondary | ICD-10-CM | POA: Diagnosis not present

## 2021-06-12 DIAGNOSIS — E559 Vitamin D deficiency, unspecified: Secondary | ICD-10-CM | POA: Diagnosis not present

## 2021-06-12 DIAGNOSIS — D649 Anemia, unspecified: Secondary | ICD-10-CM | POA: Diagnosis not present

## 2021-06-12 DIAGNOSIS — M6259 Muscle wasting and atrophy, not elsewhere classified, multiple sites: Secondary | ICD-10-CM | POA: Diagnosis not present

## 2021-06-12 DIAGNOSIS — R140 Abdominal distension (gaseous): Secondary | ICD-10-CM | POA: Diagnosis not present

## 2021-06-12 DIAGNOSIS — E039 Hypothyroidism, unspecified: Secondary | ICD-10-CM | POA: Diagnosis not present

## 2021-06-12 DIAGNOSIS — R5383 Other fatigue: Secondary | ICD-10-CM | POA: Diagnosis not present

## 2021-06-12 DIAGNOSIS — I951 Orthostatic hypotension: Secondary | ICD-10-CM | POA: Diagnosis not present

## 2021-06-12 DIAGNOSIS — E869 Volume depletion, unspecified: Secondary | ICD-10-CM | POA: Diagnosis not present

## 2021-06-12 LAB — BASIC METABOLIC PANEL
Anion gap: 6 (ref 5–15)
BUN: 16 mg/dL (ref 8–23)
CO2: 24 mmol/L (ref 22–32)
Calcium: 8.7 mg/dL — ABNORMAL LOW (ref 8.9–10.3)
Chloride: 101 mmol/L (ref 98–111)
Creatinine, Ser: 0.51 mg/dL (ref 0.44–1.00)
GFR, Estimated: >60 mL/min (ref >60)
Glucose, Bld: 131 mg/dL — ABNORMAL HIGH (ref 70–99)
Potassium: 3.7 mmol/L (ref 3.5–5.1)
Sodium: 131 mmol/L — ABNORMAL LOW (ref 135–145)

## 2021-06-12 LAB — CBC
HCT: 34.5 % — ABNORMAL LOW (ref 36.0–46.0)
Hemoglobin: 11.9 g/dL — ABNORMAL LOW (ref 12.0–15.0)
MCH: 30.2 pg (ref 26.0–34.0)
MCHC: 34.5 g/dL (ref 30.0–36.0)
MCV: 87.6 fL (ref 80.0–100.0)
Platelets: 304 10*3/uL (ref 150–400)
RBC: 3.94 MIL/uL (ref 3.87–5.11)
RDW: 14.6 % (ref 11.5–15.5)
WBC: 9.2 10*3/uL (ref 4.0–10.5)
nRBC: 0 % (ref 0.0–0.2)

## 2021-06-12 LAB — BODY FLUID CULTURE W GRAM STAIN: Culture: NO GROWTH

## 2021-06-12 NOTE — Plan of Care (Signed)
?Problem: Clinical Measurements: ?Goal: Ability to maintain clinical measurements within normal limits will improve ?Outcome: Progressing ?Goal: Will remain free from infection ?Outcome: Progressing ?Goal: Diagnostic test results will improve ?Outcome: Progressing ?Goal: Respiratory complications will improve ?Outcome: Progressing ?Goal: Cardiovascular complication will be avoided ?Outcome: Progressing                                                                                                                                                                                                                                                                                                                                                                                                                                                                                                                                                                                                              ? ?  Pt is alert and orientedx3. Pt was very forgetful, tried to get up by herself. Kept her bed alarms on. Pt eventually slept and woke up several times to urinate using the [facility] V/S stable. No complaints of pain.  ?

## 2021-06-12 NOTE — Assessment & Plan Note (Addendum)
Pointed out by physical therapy yesterday that patient was not able to dorsiflex her left foot yesterday.  Unclear as to how long this has been going on for.  Daughter thinks it might have been last week.  Patient did not have any other neurological deficits.  No back pain.  Good peripheral pulses.  MRI brain does not show any acute findings.  Could be from neuropathy.  She will need EMG/NCV.  Will send referral to [facility] for same. ?

## 2021-06-12 NOTE — Progress Notes (Signed)
Report called to [facility].  ?

## 2021-06-12 NOTE — TOC Progression Note (Signed)
Transition of Care (TOC) - Progression Note  ? ? ?Patient Details  ?Name: Christy Braun ?MRN: [id number] ?Date of Birth: May 23, 1945 ? ?Transition of Care (TOC) CM/SW Contact  ?Beverly Sessions, RN ?Phone Number: ?06/12/2021, 11:38 AM ? ?Clinical Narrative:    ? ? ?Patient will DC DU:[facility]UR ?Anticipated DC date:06/12/21 ? ?Transport [place]:[id number]  ? ?Per MD patient ready for DC to . RN, and facility notified of DC. Discharge Summary sent to facility. RN given number for report.  ?TOC signing off. ? ?Isaias Cowman RNCM ?[phone] ? ?Expected Discharge Plan: [place] ?Barriers to Discharge: Barriers Resolved ? ?Expected Discharge Plan and Services ?Expected Discharge Plan: [place] ?  ?  ?  ?Living arrangements for the past 2 months: [place] ?Expected Discharge Date: 06/12/21               ?  ?  ?  ?  ?  ?  ?  ?  ?  ?  ? ? ?Social Determinants of Health (SDOH) Interventions ?  ? ?Readmission Risk Interventions ?Readmission Risk Prevention Plan 06/09/2021  ?Transportation Screening Complete  ?PCP or Specialist Appt within 5-7 Days Complete  ?Home Care Screening Complete  ?Medication Review (RN CM) Complete  ?Some recent data might be hidden  ? ? ?

## 2021-06-12 NOTE — Discharge Summary (Signed)
?[facility] ? ?Physician Discharge Summary  ? ?Patient ID: ?Christy Braun ?MRN: [id number] ?DOB/AGE: 08/23/1945 76 y.o. ? ?Admit date: 06/08/2021 ?Discharge date:   06/12/2021 ? ? ?PCP: Adin Hector, MD ? ?DISCHARGE DIAGNOSES:  ?Principal Problem: ?  Fall ?Active Problems: ?  Right knee pain ?  Dementia ([place]) ?  Hypertension ?  Hypokalemia ?  Hypothyroidism ?  History of osteoarthritis ?  Invasive carcinoma of breast ([place]) ?  Vitamin B12 deficiency ?  Normocytic anemia ?  Pressure injury of skin ?  Left foot drop ? ? ? ?RECOMMENDATIONS FOR OUTPATIENT FOLLOW UP: ?Check thyroid function test in 3 to 4 weeks ?Check CBC and basic metabolic panel in 1 week ?Check vitamin B12 level in 3 to 4 weeks ?Ambulatory referral sent to [facility] for EMG/NCV. ? ? ?Home Health: SNF ?Equipment/Devices: None ? ?CODE STATUS: Full code ? ?DISCHARGE CONDITION: fair ? ?Diet recommendation: Low-sodium diet ? ?INITIAL HISTORY: ?76 y.o. female with medical history significant for hypothyroidism, hypertension, history of breast cancer status post partial mastectomy as well as radiation therapy and dementia who was brought into the ER by EMS for evaluation of weakness and frequent falls at home.  Patient was also complaining of pain in the right knee.  According to daughter patient has become increasingly forgetful and has had multiple falls at home.  She was hospitalized for further management. ? ?Consultations: ?Orthopedics, Dr. Roland Rack ? ?Procedures: ?Arthrocentesis  ? ? ?HOSPITAL COURSE:  ? ?* Fall ?Patient is status post fall and presents for evaluation of right knee pain and swelling as well as difficulty with ambulation. ?Apparently has been falling more than usual per daughter.  Likely due to progression of dementia.   ?Seen by PT.  Skilled nursing facility is recommended for rehabilitation.  Patient is medically stable for discharge. ? ?Right knee pain ?Patient with complaints of right knee pain and  swelling following a fall and difficulty with ambulation. ?X ray shows moderate-sized joint effusion with articular surface irregularity of the posterolateral tibial plateau.  Tricompartment osteoarthritis and/or CPPD arthropathy.  CT of the knee does not show any fractures and continues to show joint effusion. ?Seen by orthopedics and underwent arthrocentesis on 3/18.  Patient does feel better with respect to pain in her right knee. ?Synovial fluid analysis reviewed.  It was a bloody fluid with the 35,305 WBCs.  No organisms noted on Gram stain.  This is likely inflammatory.  Intracellular calcium pyrophosphate crystals were noted.  Likely pseudogout.  Cultures negative. ?Symptoms have improved.  She was given Motrin since renal function was normal.  If she has recurrence of her symptoms Motrin could be again administered.  If she has persistent symptoms despite Motrin then would either try colchicine or steroid though would hesitate to use steroids due to her dementia.   ? ?Dementia ([place]) ?Patient has a history of underlying depression and dementia. ?Continue donepezil, Celexa and buspirone. ?Per daughter patient has been more forgetful.   ?Dementia work-up was done which showed extremely low vitamin B12 level.  See below.  Folic acid level was normal.  ?RPR was nonreactive ?TSH mildly elevated at 6.5.  Free T4 normal at 0.94. ? ?Hypertension ?Holding HCTZ due to hypokalemia and hyponatremia.  Dose of ARB increased and will be continued at discharge.  HCTZ to be discontinued. ? ?Hypokalemia ?Hyponatremia ? ?Secondary to diuretics.  Potassium was repleted.  Sodium level is low but stable. ? ?Hypothyroidism ?Noted to be on Synthroid.  TSH elevated  at 6.5.  Free T4 normal at 0.9.  Continue current dose of Synthroid.  Recheck labs in 3 to 4 weeks. ? ?History of osteoarthritis ?Pseudogout based on synovial fluid analysis ? ?See discussion under knee pain. ? ?Invasive carcinoma of breast ([place]) ?Status post partial  mastectomy as well as radiation therapy ?Continue tamoxifen ? ?Normocytic anemia ?Drop in hemoglobin is dilutional.  No evidence of overt bleeding.  Hemoglobin is stable. ? ?Vitamin B12 deficiency ?Replace B12 with IM injections initially and then transition to oral. ?Recheck B12 level in 3 to 4 weeks. ? ?Left foot drop ?Pointed out by physical therapy yesterday that patient was not able to dorsiflex her left foot yesterday.  Unclear as to how long this has been going on for.  Daughter thinks it might have been last week.  Patient did not have any other neurological deficits.  No back pain.  Good peripheral pulses.  MRI brain does not show any acute findings.  Could be from neuropathy.  She will need EMG/NCV.  Will send referral to [facility] for same. ? ? ? ?Stage I pressure injury right and left ischial tuberosity and bilateral heels ?Pressure Injury 06/08/21 Ischial tuberosity Right Stage 1 -  Intact skin with non-blanchable redness of a localized area usually over a bony prominence. (Active)  ?06/08/21 1530  ?Location: Ischial tuberosity  ?Location Orientation: Right  ?Staging: Stage 1 -  Intact skin with non-blanchable redness of a localized area usually over a bony prominence.  ?Wound Description (Comments):   ?Present on Admission: Yes  ?   ?Pressure Injury 06/08/21 Ischial tuberosity Left Stage 1 -  Intact skin with non-blanchable redness of a localized area usually over a bony prominence. (Active)  ?06/08/21 1530  ?Location: Ischial tuberosity  ?Location Orientation: Left  ?Staging: Stage 1 -  Intact skin with non-blanchable redness of a localized area usually over a bony prominence.  ?Wound Description (Comments):   ?Present on Admission: Yes  ?   ?Pressure Injury 06/08/21 Heel Bilateral Stage 1 -  Intact skin with non-blanchable redness of a localized area usually over a bony prominence. (Active)  ?06/08/21 1530  ?Location: Heel  ?Location Orientation: Bilateral  ?Staging: Stage 1 -   Intact skin with non-blanchable redness of a localized area usually over a bony prominence.  ?Wound Description (Comments):   ?Present on Admission: Yes  ? ? ?Patient is stable.  Okay for discharge to SNF.  Discussed with daughter. ? ? ?PERTINENT LABS: ? ?The results of significant diagnostics from this hospitalization (including imaging, microbiology, ancillary and laboratory) are listed below for reference.   ? ?Microbiology: ?Recent Results (from the past 240 hour(s))  ?Body fluid culture w Gram Stain     Status: None (Preliminary result)  ? Collection Time: 06/09/21 11:18 AM  ? Specimen: Synovium; Body Fluid  ?Result Value Ref Range Status  ? Specimen Description   Final  ?  SYNOVIAL ?Performed at [facility], [address] [place], [place] [address] ?  ? Special Requests   Final  ?  RIGHT KNEE JOINT FLUID ?Performed at [facility], [address], [place], [place] [address] ?  ? Gram Stain   Final  ?  MODERATE WBC PRESENT, PREDOMINANTLY PMN ?NO ORGANISMS SEEN ?  ? Culture   Final  ?  NO GROWTH 2 DAYS ?Performed at [facility], [place] [address], [place], [place] [address] ?  ? Report Status PENDING  Incomplete  ?  ? ?Labs: ? ? ? ? ?  Basic Metabolic Panel: ?Recent Labs  ?Lab 06/08/21 ?0948 06/08/21 ?1514 06/09/21 ?3474 06/10/21 ?2595 06/11/21 ?6387 06/12/21 ?5643  ?NA 133*  --  131* 133* 131* 131*  ?K 3.3*  --  2.9* 4.2 3.6 3.7  ?CL 98  --  95* 104 101 101  ?CO2 25  --  '27 24 24 24  '$ ?GLUCOSE 125*  --  120* 152* 125* 131*  ?BUN 16  --  '9 12 14 16  '$ ?CREATININE 0.52  --  0.55 0.52 0.47 0.51  ?CALCIUM 8.7*  --  8.6* 8.5* 8.5* 8.7*  ?MG  --  1.7  --  2.0  --   --   ? ?Liver Function Tests: ?Recent Labs  ?Lab 06/08/21 ?0948  ?AST 29  ?ALT 22  ?ALKPHOS 66  ?BILITOT 1.0  ?PROT 7.4  ?ALBUMIN 4.1  ? ? ?CBC: ?Recent Labs  ?Lab 06/08/21 ?0948 06/09/21 ?3295 06/10/21 ?1884 06/11/21 ?1660 06/12/21 ?6301  ?WBC 10.9* 11.7* 9.1 10.9* 9.2  ?HGB 12.2 11.6* 11.0* 11.3* 11.9*  ?HCT 35.8* 33.7* 31.8* 32.8*  34.5*  ?MCV 89.9 89.2 88.6 89.6 87.6  ?PLT 250 218 214 268 304  ? ?Cardiac Enzymes: ?Recent Labs  ?Lab 06/08/21 ?1514  ?CKTOTAL 145  ? ? ? ?IMAGING STUDIES ?DG Pelvis 1-2 Views ? ?Result Date: 06/08/2021 ?CLI

## 2021-06-12 NOTE — Progress Notes (Signed)
PT Cancellation Note ? ?Patient Details ?Name: FAYETTE GASNER ?MRN: [id number] ?DOB: 06-Aug-1945 ? ? ?Cancelled Treatment:    Reason Eval/Treat Not Completed:  (patient declined). Patient requesting to rest at this time. She deferred bed level exercise also. PT will continue with attempts as appropriate.  ? ?Minna Merritts, PT, MPT ? ?Percell Locus ?06/12/2021, 12:12 PM ?

## 2021-06-13 DIAGNOSIS — R296 Repeated falls: Secondary | ICD-10-CM | POA: Diagnosis not present

## 2021-06-13 DIAGNOSIS — F0390 Unspecified dementia without behavioral disturbance: Secondary | ICD-10-CM | POA: Diagnosis not present

## 2021-06-13 DIAGNOSIS — I10 Essential (primary) hypertension: Secondary | ICD-10-CM | POA: Diagnosis not present

## 2021-06-13 DIAGNOSIS — M25561 Pain in right knee: Secondary | ICD-10-CM | POA: Diagnosis not present

## 2021-06-14 DIAGNOSIS — E039 Hypothyroidism, unspecified: Secondary | ICD-10-CM | POA: Diagnosis not present

## 2021-06-14 DIAGNOSIS — F32A Depression, unspecified: Secondary | ICD-10-CM | POA: Diagnosis not present

## 2021-06-14 DIAGNOSIS — R296 Repeated falls: Secondary | ICD-10-CM | POA: Diagnosis not present

## 2021-06-14 DIAGNOSIS — C50919 Malignant neoplasm of unspecified site of unspecified female breast: Secondary | ICD-10-CM | POA: Diagnosis not present

## 2021-06-15 DIAGNOSIS — I10 Essential (primary) hypertension: Secondary | ICD-10-CM | POA: Diagnosis not present

## 2021-06-15 DIAGNOSIS — E876 Hypokalemia: Secondary | ICD-10-CM | POA: Diagnosis not present

## 2021-06-15 DIAGNOSIS — F0390 Unspecified dementia without behavioral disturbance: Secondary | ICD-10-CM | POA: Diagnosis not present

## 2021-06-15 DIAGNOSIS — M25561 Pain in right knee: Secondary | ICD-10-CM | POA: Diagnosis not present

## 2021-06-18 DIAGNOSIS — I10 Essential (primary) hypertension: Secondary | ICD-10-CM | POA: Diagnosis not present

## 2021-06-18 DIAGNOSIS — M25561 Pain in right knee: Secondary | ICD-10-CM | POA: Diagnosis not present

## 2021-06-21 DIAGNOSIS — M25561 Pain in right knee: Secondary | ICD-10-CM | POA: Diagnosis not present

## 2021-06-21 DIAGNOSIS — F0390 Unspecified dementia without behavioral disturbance: Secondary | ICD-10-CM | POA: Diagnosis not present

## 2021-06-21 DIAGNOSIS — I10 Essential (primary) hypertension: Secondary | ICD-10-CM | POA: Diagnosis not present

## 2021-06-26 DIAGNOSIS — M25561 Pain in right knee: Secondary | ICD-10-CM | POA: Diagnosis not present

## 2021-06-26 DIAGNOSIS — F0390 Unspecified dementia without behavioral disturbance: Secondary | ICD-10-CM | POA: Diagnosis not present

## 2021-06-26 DIAGNOSIS — I10 Essential (primary) hypertension: Secondary | ICD-10-CM | POA: Diagnosis not present

## 2021-06-26 DIAGNOSIS — E876 Hypokalemia: Secondary | ICD-10-CM | POA: Diagnosis not present

## 2021-06-27 DIAGNOSIS — I10 Essential (primary) hypertension: Secondary | ICD-10-CM | POA: Diagnosis not present

## 2021-06-27 DIAGNOSIS — M25561 Pain in right knee: Secondary | ICD-10-CM | POA: Diagnosis not present

## 2021-06-27 DIAGNOSIS — F0390 Unspecified dementia without behavioral disturbance: Secondary | ICD-10-CM | POA: Diagnosis not present

## 2021-06-29 DIAGNOSIS — E869 Volume depletion, unspecified: Secondary | ICD-10-CM | POA: Diagnosis not present

## 2021-06-29 DIAGNOSIS — I951 Orthostatic hypotension: Secondary | ICD-10-CM | POA: Diagnosis not present

## 2021-06-29 DIAGNOSIS — R110 Nausea: Secondary | ICD-10-CM | POA: Diagnosis not present

## 2021-06-29 DIAGNOSIS — F0390 Unspecified dementia without behavioral disturbance: Secondary | ICD-10-CM | POA: Diagnosis not present

## 2021-07-02 DIAGNOSIS — R110 Nausea: Secondary | ICD-10-CM | POA: Diagnosis not present

## 2021-07-02 DIAGNOSIS — I951 Orthostatic hypotension: Secondary | ICD-10-CM | POA: Diagnosis not present

## 2021-07-02 DIAGNOSIS — F0390 Unspecified dementia without behavioral disturbance: Secondary | ICD-10-CM | POA: Diagnosis not present

## 2021-07-02 DIAGNOSIS — M25561 Pain in right knee: Secondary | ICD-10-CM | POA: Diagnosis not present

## 2021-07-05 DIAGNOSIS — I10 Essential (primary) hypertension: Secondary | ICD-10-CM | POA: Diagnosis not present

## 2021-07-05 DIAGNOSIS — R5381 Other malaise: Secondary | ICD-10-CM | POA: Diagnosis not present

## 2021-07-05 DIAGNOSIS — E871 Hypo-osmolality and hyponatremia: Secondary | ICD-10-CM | POA: Diagnosis not present

## 2021-07-05 DIAGNOSIS — R42 Dizziness and giddiness: Secondary | ICD-10-CM | POA: Diagnosis not present

## 2021-07-05 DIAGNOSIS — E785 Hyperlipidemia, unspecified: Secondary | ICD-10-CM | POA: Diagnosis not present

## 2021-07-05 DIAGNOSIS — E559 Vitamin D deficiency, unspecified: Secondary | ICD-10-CM | POA: Diagnosis not present

## 2021-07-05 DIAGNOSIS — K219 Gastro-esophageal reflux disease without esophagitis: Secondary | ICD-10-CM | POA: Diagnosis not present

## 2021-07-05 DIAGNOSIS — E039 Hypothyroidism, unspecified: Secondary | ICD-10-CM | POA: Diagnosis not present

## 2021-07-11 DIAGNOSIS — R2681 Unsteadiness on feet: Secondary | ICD-10-CM | POA: Diagnosis not present

## 2021-07-11 DIAGNOSIS — R262 Difficulty in walking, not elsewhere classified: Secondary | ICD-10-CM | POA: Diagnosis not present

## 2021-07-12 DIAGNOSIS — Z79899 Other long term (current) drug therapy: Secondary | ICD-10-CM | POA: Diagnosis not present

## 2021-07-12 DIAGNOSIS — E785 Hyperlipidemia, unspecified: Secondary | ICD-10-CM | POA: Diagnosis not present

## 2021-07-12 DIAGNOSIS — K219 Gastro-esophageal reflux disease without esophagitis: Secondary | ICD-10-CM | POA: Diagnosis not present

## 2021-07-12 DIAGNOSIS — E559 Vitamin D deficiency, unspecified: Secondary | ICD-10-CM | POA: Diagnosis not present

## 2021-07-12 DIAGNOSIS — R5381 Other malaise: Secondary | ICD-10-CM | POA: Diagnosis not present

## 2021-07-12 DIAGNOSIS — E871 Hypo-osmolality and hyponatremia: Secondary | ICD-10-CM | POA: Diagnosis not present

## 2021-07-12 DIAGNOSIS — E119 Type 2 diabetes mellitus without complications: Secondary | ICD-10-CM | POA: Diagnosis not present

## 2021-07-12 DIAGNOSIS — R112 Nausea with vomiting, unspecified: Secondary | ICD-10-CM | POA: Diagnosis not present

## 2021-07-12 DIAGNOSIS — F0390 Unspecified dementia without behavioral disturbance: Secondary | ICD-10-CM | POA: Diagnosis not present

## 2021-07-12 DIAGNOSIS — I10 Essential (primary) hypertension: Secondary | ICD-10-CM | POA: Diagnosis not present

## 2021-07-12 DIAGNOSIS — D518 Other vitamin B12 deficiency anemias: Secondary | ICD-10-CM | POA: Diagnosis not present

## 2021-07-12 DIAGNOSIS — R42 Dizziness and giddiness: Secondary | ICD-10-CM | POA: Diagnosis not present

## 2021-07-12 DIAGNOSIS — E038 Other specified hypothyroidism: Secondary | ICD-10-CM | POA: Diagnosis not present

## 2021-07-12 DIAGNOSIS — E7849 Other hyperlipidemia: Secondary | ICD-10-CM | POA: Diagnosis not present

## 2021-07-12 DIAGNOSIS — R197 Diarrhea, unspecified: Secondary | ICD-10-CM | POA: Diagnosis not present

## 2021-07-12 DIAGNOSIS — E039 Hypothyroidism, unspecified: Secondary | ICD-10-CM | POA: Diagnosis not present

## 2021-07-17 DIAGNOSIS — R2681 Unsteadiness on feet: Secondary | ICD-10-CM | POA: Diagnosis not present

## 2021-07-17 DIAGNOSIS — R262 Difficulty in walking, not elsewhere classified: Secondary | ICD-10-CM | POA: Diagnosis not present

## 2021-07-18 DIAGNOSIS — D518 Other vitamin B12 deficiency anemias: Secondary | ICD-10-CM | POA: Diagnosis not present

## 2021-07-18 DIAGNOSIS — E119 Type 2 diabetes mellitus without complications: Secondary | ICD-10-CM | POA: Diagnosis not present

## 2021-07-18 DIAGNOSIS — E7849 Other hyperlipidemia: Secondary | ICD-10-CM | POA: Diagnosis not present

## 2021-07-18 DIAGNOSIS — Z79899 Other long term (current) drug therapy: Secondary | ICD-10-CM | POA: Diagnosis not present

## 2021-07-19 DIAGNOSIS — E871 Hypo-osmolality and hyponatremia: Secondary | ICD-10-CM | POA: Diagnosis not present

## 2021-07-19 DIAGNOSIS — E559 Vitamin D deficiency, unspecified: Secondary | ICD-10-CM | POA: Diagnosis not present

## 2021-07-19 DIAGNOSIS — R5381 Other malaise: Secondary | ICD-10-CM | POA: Diagnosis not present

## 2021-07-19 DIAGNOSIS — E876 Hypokalemia: Secondary | ICD-10-CM | POA: Diagnosis not present

## 2021-07-19 DIAGNOSIS — I10 Essential (primary) hypertension: Secondary | ICD-10-CM | POA: Diagnosis not present

## 2021-07-19 DIAGNOSIS — M25511 Pain in right shoulder: Secondary | ICD-10-CM | POA: Diagnosis not present

## 2021-07-19 DIAGNOSIS — R197 Diarrhea, unspecified: Secondary | ICD-10-CM | POA: Diagnosis not present

## 2021-07-19 DIAGNOSIS — K219 Gastro-esophageal reflux disease without esophagitis: Secondary | ICD-10-CM | POA: Diagnosis not present

## 2021-07-19 DIAGNOSIS — E039 Hypothyroidism, unspecified: Secondary | ICD-10-CM | POA: Diagnosis not present

## 2021-07-19 DIAGNOSIS — R42 Dizziness and giddiness: Secondary | ICD-10-CM | POA: Diagnosis not present

## 2021-07-19 DIAGNOSIS — E785 Hyperlipidemia, unspecified: Secondary | ICD-10-CM | POA: Diagnosis not present

## 2021-07-21 DIAGNOSIS — M25511 Pain in right shoulder: Secondary | ICD-10-CM | POA: Diagnosis not present

## 2021-07-24 DIAGNOSIS — R2681 Unsteadiness on feet: Secondary | ICD-10-CM | POA: Diagnosis not present

## 2021-07-24 DIAGNOSIS — R262 Difficulty in walking, not elsewhere classified: Secondary | ICD-10-CM | POA: Diagnosis not present

## 2021-07-25 DIAGNOSIS — E119 Type 2 diabetes mellitus without complications: Secondary | ICD-10-CM | POA: Diagnosis not present

## 2021-07-25 DIAGNOSIS — Z79899 Other long term (current) drug therapy: Secondary | ICD-10-CM | POA: Diagnosis not present

## 2021-07-25 DIAGNOSIS — D518 Other vitamin B12 deficiency anemias: Secondary | ICD-10-CM | POA: Diagnosis not present

## 2021-07-25 DIAGNOSIS — E7849 Other hyperlipidemia: Secondary | ICD-10-CM | POA: Diagnosis not present

## 2021-07-26 DIAGNOSIS — R262 Difficulty in walking, not elsewhere classified: Secondary | ICD-10-CM | POA: Diagnosis not present

## 2021-07-26 DIAGNOSIS — R2681 Unsteadiness on feet: Secondary | ICD-10-CM | POA: Diagnosis not present

## 2021-07-31 DIAGNOSIS — R2681 Unsteadiness on feet: Secondary | ICD-10-CM | POA: Diagnosis not present

## 2021-07-31 DIAGNOSIS — R262 Difficulty in walking, not elsewhere classified: Secondary | ICD-10-CM | POA: Diagnosis not present

## 2021-08-01 DIAGNOSIS — D518 Other vitamin B12 deficiency anemias: Secondary | ICD-10-CM | POA: Diagnosis not present

## 2021-08-01 DIAGNOSIS — E119 Type 2 diabetes mellitus without complications: Secondary | ICD-10-CM | POA: Diagnosis not present

## 2021-08-01 DIAGNOSIS — Z79899 Other long term (current) drug therapy: Secondary | ICD-10-CM | POA: Diagnosis not present

## 2021-08-01 DIAGNOSIS — E7849 Other hyperlipidemia: Secondary | ICD-10-CM | POA: Diagnosis not present

## 2021-08-02 DIAGNOSIS — R262 Difficulty in walking, not elsewhere classified: Secondary | ICD-10-CM | POA: Diagnosis not present

## 2021-08-02 DIAGNOSIS — R2681 Unsteadiness on feet: Secondary | ICD-10-CM | POA: Diagnosis not present

## 2021-08-07 DIAGNOSIS — R262 Difficulty in walking, not elsewhere classified: Secondary | ICD-10-CM | POA: Diagnosis not present

## 2021-08-07 DIAGNOSIS — R2681 Unsteadiness on feet: Secondary | ICD-10-CM | POA: Diagnosis not present

## 2021-08-08 DIAGNOSIS — Z79899 Other long term (current) drug therapy: Secondary | ICD-10-CM | POA: Diagnosis not present

## 2021-08-08 DIAGNOSIS — D518 Other vitamin B12 deficiency anemias: Secondary | ICD-10-CM | POA: Diagnosis not present

## 2021-08-08 DIAGNOSIS — E119 Type 2 diabetes mellitus without complications: Secondary | ICD-10-CM | POA: Diagnosis not present

## 2021-08-08 DIAGNOSIS — E7849 Other hyperlipidemia: Secondary | ICD-10-CM | POA: Diagnosis not present

## 2021-08-09 DIAGNOSIS — E039 Hypothyroidism, unspecified: Secondary | ICD-10-CM | POA: Diagnosis not present

## 2021-08-09 DIAGNOSIS — I10 Essential (primary) hypertension: Secondary | ICD-10-CM | POA: Diagnosis not present

## 2021-08-09 DIAGNOSIS — F419 Anxiety disorder, unspecified: Secondary | ICD-10-CM | POA: Diagnosis not present

## 2021-08-09 DIAGNOSIS — R2681 Unsteadiness on feet: Secondary | ICD-10-CM | POA: Diagnosis not present

## 2021-08-09 DIAGNOSIS — R262 Difficulty in walking, not elsewhere classified: Secondary | ICD-10-CM | POA: Diagnosis not present

## 2021-08-09 DIAGNOSIS — E876 Hypokalemia: Secondary | ICD-10-CM | POA: Diagnosis not present

## 2021-08-09 DIAGNOSIS — K219 Gastro-esophageal reflux disease without esophagitis: Secondary | ICD-10-CM | POA: Diagnosis not present

## 2021-08-09 DIAGNOSIS — E559 Vitamin D deficiency, unspecified: Secondary | ICD-10-CM | POA: Diagnosis not present

## 2021-08-09 DIAGNOSIS — F0390 Unspecified dementia without behavioral disturbance: Secondary | ICD-10-CM | POA: Diagnosis not present

## 2021-08-09 DIAGNOSIS — E785 Hyperlipidemia, unspecified: Secondary | ICD-10-CM | POA: Diagnosis not present

## 2021-08-09 DIAGNOSIS — R5381 Other malaise: Secondary | ICD-10-CM | POA: Diagnosis not present

## 2021-08-09 DIAGNOSIS — M25511 Pain in right shoulder: Secondary | ICD-10-CM | POA: Diagnosis not present

## 2021-08-14 DIAGNOSIS — R2681 Unsteadiness on feet: Secondary | ICD-10-CM | POA: Diagnosis not present

## 2021-08-14 DIAGNOSIS — R262 Difficulty in walking, not elsewhere classified: Secondary | ICD-10-CM | POA: Diagnosis not present

## 2021-08-15 DIAGNOSIS — E119 Type 2 diabetes mellitus without complications: Secondary | ICD-10-CM | POA: Diagnosis not present

## 2021-08-15 DIAGNOSIS — E785 Hyperlipidemia, unspecified: Secondary | ICD-10-CM | POA: Diagnosis not present

## 2021-08-15 DIAGNOSIS — E039 Hypothyroidism, unspecified: Secondary | ICD-10-CM | POA: Diagnosis not present

## 2021-08-15 DIAGNOSIS — E038 Other specified hypothyroidism: Secondary | ICD-10-CM | POA: Diagnosis not present

## 2021-08-15 DIAGNOSIS — I10 Essential (primary) hypertension: Secondary | ICD-10-CM | POA: Diagnosis not present

## 2021-08-15 DIAGNOSIS — D518 Other vitamin B12 deficiency anemias: Secondary | ICD-10-CM | POA: Diagnosis not present

## 2021-08-15 DIAGNOSIS — E559 Vitamin D deficiency, unspecified: Secondary | ICD-10-CM | POA: Diagnosis not present

## 2021-08-16 DIAGNOSIS — I10 Essential (primary) hypertension: Secondary | ICD-10-CM | POA: Diagnosis not present

## 2021-08-17 DIAGNOSIS — R262 Difficulty in walking, not elsewhere classified: Secondary | ICD-10-CM | POA: Diagnosis not present

## 2021-08-17 DIAGNOSIS — R2681 Unsteadiness on feet: Secondary | ICD-10-CM | POA: Diagnosis not present

## 2021-08-21 DIAGNOSIS — R262 Difficulty in walking, not elsewhere classified: Secondary | ICD-10-CM | POA: Diagnosis not present

## 2021-08-21 DIAGNOSIS — R2681 Unsteadiness on feet: Secondary | ICD-10-CM | POA: Diagnosis not present

## 2021-08-23 DIAGNOSIS — R2681 Unsteadiness on feet: Secondary | ICD-10-CM | POA: Diagnosis not present

## 2021-08-23 DIAGNOSIS — R262 Difficulty in walking, not elsewhere classified: Secondary | ICD-10-CM | POA: Diagnosis not present

## 2021-08-28 DIAGNOSIS — R2681 Unsteadiness on feet: Secondary | ICD-10-CM | POA: Diagnosis not present

## 2021-08-28 DIAGNOSIS — R262 Difficulty in walking, not elsewhere classified: Secondary | ICD-10-CM | POA: Diagnosis not present

## 2021-08-30 DIAGNOSIS — R262 Difficulty in walking, not elsewhere classified: Secondary | ICD-10-CM | POA: Diagnosis not present

## 2021-08-30 DIAGNOSIS — R2681 Unsteadiness on feet: Secondary | ICD-10-CM | POA: Diagnosis not present

## 2021-09-04 DIAGNOSIS — R2681 Unsteadiness on feet: Secondary | ICD-10-CM | POA: Diagnosis not present

## 2021-09-04 DIAGNOSIS — R262 Difficulty in walking, not elsewhere classified: Secondary | ICD-10-CM | POA: Diagnosis not present

## 2021-09-06 DIAGNOSIS — R5381 Other malaise: Secondary | ICD-10-CM | POA: Diagnosis not present

## 2021-09-06 DIAGNOSIS — E785 Hyperlipidemia, unspecified: Secondary | ICD-10-CM | POA: Diagnosis not present

## 2021-09-06 DIAGNOSIS — E559 Vitamin D deficiency, unspecified: Secondary | ICD-10-CM | POA: Diagnosis not present

## 2021-09-06 DIAGNOSIS — M25511 Pain in right shoulder: Secondary | ICD-10-CM | POA: Diagnosis not present

## 2021-09-06 DIAGNOSIS — F419 Anxiety disorder, unspecified: Secondary | ICD-10-CM | POA: Diagnosis not present

## 2021-09-06 DIAGNOSIS — K219 Gastro-esophageal reflux disease without esophagitis: Secondary | ICD-10-CM | POA: Diagnosis not present

## 2021-09-06 DIAGNOSIS — R2681 Unsteadiness on feet: Secondary | ICD-10-CM | POA: Diagnosis not present

## 2021-09-06 DIAGNOSIS — R262 Difficulty in walking, not elsewhere classified: Secondary | ICD-10-CM | POA: Diagnosis not present

## 2021-09-06 DIAGNOSIS — E039 Hypothyroidism, unspecified: Secondary | ICD-10-CM | POA: Diagnosis not present

## 2021-09-11 DIAGNOSIS — E119 Type 2 diabetes mellitus without complications: Secondary | ICD-10-CM | POA: Diagnosis not present

## 2021-09-11 DIAGNOSIS — I10 Essential (primary) hypertension: Secondary | ICD-10-CM | POA: Diagnosis not present

## 2021-09-11 DIAGNOSIS — D518 Other vitamin B12 deficiency anemias: Secondary | ICD-10-CM | POA: Diagnosis not present

## 2021-09-11 DIAGNOSIS — E039 Hypothyroidism, unspecified: Secondary | ICD-10-CM | POA: Diagnosis not present

## 2021-09-11 DIAGNOSIS — E038 Other specified hypothyroidism: Secondary | ICD-10-CM | POA: Diagnosis not present

## 2021-09-11 DIAGNOSIS — E559 Vitamin D deficiency, unspecified: Secondary | ICD-10-CM | POA: Diagnosis not present

## 2021-09-11 DIAGNOSIS — E785 Hyperlipidemia, unspecified: Secondary | ICD-10-CM | POA: Diagnosis not present

## 2021-09-14 DIAGNOSIS — R2681 Unsteadiness on feet: Secondary | ICD-10-CM | POA: Diagnosis not present

## 2021-09-14 DIAGNOSIS — R262 Difficulty in walking, not elsewhere classified: Secondary | ICD-10-CM | POA: Diagnosis not present

## 2021-09-16 DIAGNOSIS — I10 Essential (primary) hypertension: Secondary | ICD-10-CM | POA: Diagnosis not present

## 2021-09-18 DIAGNOSIS — F02A0 Dementia in other diseases classified elsewhere, mild, without behavioral disturbance, psychotic disturbance, mood disturbance, and anxiety: Secondary | ICD-10-CM | POA: Diagnosis not present

## 2021-09-18 DIAGNOSIS — R2681 Unsteadiness on feet: Secondary | ICD-10-CM | POA: Diagnosis not present

## 2021-09-18 DIAGNOSIS — G301 Alzheimer's disease with late onset: Secondary | ICD-10-CM | POA: Diagnosis not present

## 2021-09-18 DIAGNOSIS — R262 Difficulty in walking, not elsewhere classified: Secondary | ICD-10-CM | POA: Diagnosis not present

## 2021-09-18 DIAGNOSIS — S4991XD Unspecified injury of right shoulder and upper arm, subsequent encounter: Secondary | ICD-10-CM | POA: Diagnosis not present

## 2021-09-18 DIAGNOSIS — Z853 Personal history of malignant neoplasm of breast: Secondary | ICD-10-CM | POA: Diagnosis not present

## 2021-09-20 DIAGNOSIS — R2681 Unsteadiness on feet: Secondary | ICD-10-CM | POA: Diagnosis not present

## 2021-09-20 DIAGNOSIS — R262 Difficulty in walking, not elsewhere classified: Secondary | ICD-10-CM | POA: Diagnosis not present

## 2021-09-26 DIAGNOSIS — R2681 Unsteadiness on feet: Secondary | ICD-10-CM | POA: Diagnosis not present

## 2021-09-26 DIAGNOSIS — R262 Difficulty in walking, not elsewhere classified: Secondary | ICD-10-CM | POA: Diagnosis not present

## 2021-10-02 DIAGNOSIS — R2681 Unsteadiness on feet: Secondary | ICD-10-CM | POA: Diagnosis not present

## 2021-10-02 DIAGNOSIS — R262 Difficulty in walking, not elsewhere classified: Secondary | ICD-10-CM | POA: Diagnosis not present

## 2021-10-04 DIAGNOSIS — F419 Anxiety disorder, unspecified: Secondary | ICD-10-CM | POA: Diagnosis not present

## 2021-10-04 DIAGNOSIS — I10 Essential (primary) hypertension: Secondary | ICD-10-CM | POA: Diagnosis not present

## 2021-10-04 DIAGNOSIS — R262 Difficulty in walking, not elsewhere classified: Secondary | ICD-10-CM | POA: Diagnosis not present

## 2021-10-04 DIAGNOSIS — K219 Gastro-esophageal reflux disease without esophagitis: Secondary | ICD-10-CM | POA: Diagnosis not present

## 2021-10-04 DIAGNOSIS — E559 Vitamin D deficiency, unspecified: Secondary | ICD-10-CM | POA: Diagnosis not present

## 2021-10-04 DIAGNOSIS — E785 Hyperlipidemia, unspecified: Secondary | ICD-10-CM | POA: Diagnosis not present

## 2021-10-04 DIAGNOSIS — M25511 Pain in right shoulder: Secondary | ICD-10-CM | POA: Diagnosis not present

## 2021-10-04 DIAGNOSIS — E039 Hypothyroidism, unspecified: Secondary | ICD-10-CM | POA: Diagnosis not present

## 2021-10-04 DIAGNOSIS — R2681 Unsteadiness on feet: Secondary | ICD-10-CM | POA: Diagnosis not present

## 2021-10-11 DIAGNOSIS — R2681 Unsteadiness on feet: Secondary | ICD-10-CM | POA: Diagnosis not present

## 2021-10-11 DIAGNOSIS — R262 Difficulty in walking, not elsewhere classified: Secondary | ICD-10-CM | POA: Diagnosis not present

## 2021-10-16 DIAGNOSIS — R262 Difficulty in walking, not elsewhere classified: Secondary | ICD-10-CM | POA: Diagnosis not present

## 2021-10-16 DIAGNOSIS — R2681 Unsteadiness on feet: Secondary | ICD-10-CM | POA: Diagnosis not present

## 2021-10-16 DIAGNOSIS — I10 Essential (primary) hypertension: Secondary | ICD-10-CM | POA: Diagnosis not present

## 2021-10-19 DIAGNOSIS — R262 Difficulty in walking, not elsewhere classified: Secondary | ICD-10-CM | POA: Diagnosis not present

## 2021-10-19 DIAGNOSIS — R2681 Unsteadiness on feet: Secondary | ICD-10-CM | POA: Diagnosis not present

## 2021-10-22 DIAGNOSIS — E119 Type 2 diabetes mellitus without complications: Secondary | ICD-10-CM | POA: Diagnosis not present

## 2021-10-22 DIAGNOSIS — M25561 Pain in right knee: Secondary | ICD-10-CM | POA: Diagnosis not present

## 2021-10-22 DIAGNOSIS — E785 Hyperlipidemia, unspecified: Secondary | ICD-10-CM | POA: Diagnosis not present

## 2021-10-22 DIAGNOSIS — E559 Vitamin D deficiency, unspecified: Secondary | ICD-10-CM | POA: Diagnosis not present

## 2021-10-22 DIAGNOSIS — E871 Hypo-osmolality and hyponatremia: Secondary | ICD-10-CM | POA: Diagnosis not present

## 2021-10-22 DIAGNOSIS — I10 Essential (primary) hypertension: Secondary | ICD-10-CM | POA: Diagnosis not present

## 2021-10-22 DIAGNOSIS — Z79811 Long term (current) use of aromatase inhibitors: Secondary | ICD-10-CM | POA: Diagnosis not present

## 2021-10-22 DIAGNOSIS — E039 Hypothyroidism, unspecified: Secondary | ICD-10-CM | POA: Diagnosis not present

## 2021-10-22 DIAGNOSIS — F419 Anxiety disorder, unspecified: Secondary | ICD-10-CM | POA: Diagnosis not present

## 2021-10-22 DIAGNOSIS — F0390 Unspecified dementia without behavioral disturbance: Secondary | ICD-10-CM | POA: Diagnosis not present

## 2021-10-22 DIAGNOSIS — E038 Other specified hypothyroidism: Secondary | ICD-10-CM | POA: Diagnosis not present

## 2021-10-22 DIAGNOSIS — D518 Other vitamin B12 deficiency anemias: Secondary | ICD-10-CM | POA: Diagnosis not present

## 2021-10-22 DIAGNOSIS — Z853 Personal history of malignant neoplasm of breast: Secondary | ICD-10-CM | POA: Diagnosis not present

## 2021-10-23 DIAGNOSIS — R262 Difficulty in walking, not elsewhere classified: Secondary | ICD-10-CM | POA: Diagnosis not present

## 2021-10-23 DIAGNOSIS — R2681 Unsteadiness on feet: Secondary | ICD-10-CM | POA: Diagnosis not present

## 2021-10-24 DIAGNOSIS — F0390 Unspecified dementia without behavioral disturbance: Secondary | ICD-10-CM | POA: Diagnosis not present

## 2021-10-24 DIAGNOSIS — Z79811 Long term (current) use of aromatase inhibitors: Secondary | ICD-10-CM | POA: Diagnosis not present

## 2021-10-24 DIAGNOSIS — E559 Vitamin D deficiency, unspecified: Secondary | ICD-10-CM | POA: Diagnosis not present

## 2021-10-24 DIAGNOSIS — E785 Hyperlipidemia, unspecified: Secondary | ICD-10-CM | POA: Diagnosis not present

## 2021-10-24 DIAGNOSIS — I10 Essential (primary) hypertension: Secondary | ICD-10-CM | POA: Diagnosis not present

## 2021-10-24 DIAGNOSIS — F419 Anxiety disorder, unspecified: Secondary | ICD-10-CM | POA: Diagnosis not present

## 2021-10-24 DIAGNOSIS — M25561 Pain in right knee: Secondary | ICD-10-CM | POA: Diagnosis not present

## 2021-10-24 DIAGNOSIS — E039 Hypothyroidism, unspecified: Secondary | ICD-10-CM | POA: Diagnosis not present

## 2021-10-24 DIAGNOSIS — Z853 Personal history of malignant neoplasm of breast: Secondary | ICD-10-CM | POA: Diagnosis not present

## 2021-10-24 DIAGNOSIS — E871 Hypo-osmolality and hyponatremia: Secondary | ICD-10-CM | POA: Diagnosis not present

## 2021-10-25 DIAGNOSIS — R2681 Unsteadiness on feet: Secondary | ICD-10-CM | POA: Diagnosis not present

## 2021-10-25 DIAGNOSIS — R262 Difficulty in walking, not elsewhere classified: Secondary | ICD-10-CM | POA: Diagnosis not present

## 2021-11-01 DIAGNOSIS — K219 Gastro-esophageal reflux disease without esophagitis: Secondary | ICD-10-CM | POA: Diagnosis not present

## 2021-11-01 DIAGNOSIS — E785 Hyperlipidemia, unspecified: Secondary | ICD-10-CM | POA: Diagnosis not present

## 2021-11-01 DIAGNOSIS — E039 Hypothyroidism, unspecified: Secondary | ICD-10-CM | POA: Diagnosis not present

## 2021-11-01 DIAGNOSIS — F419 Anxiety disorder, unspecified: Secondary | ICD-10-CM | POA: Diagnosis not present

## 2021-11-01 DIAGNOSIS — E559 Vitamin D deficiency, unspecified: Secondary | ICD-10-CM | POA: Diagnosis not present

## 2021-11-01 DIAGNOSIS — I10 Essential (primary) hypertension: Secondary | ICD-10-CM | POA: Diagnosis not present

## 2021-11-01 DIAGNOSIS — F0390 Unspecified dementia without behavioral disturbance: Secondary | ICD-10-CM | POA: Diagnosis not present

## 2021-11-16 DIAGNOSIS — I10 Essential (primary) hypertension: Secondary | ICD-10-CM | POA: Diagnosis not present

## 2021-11-22 DIAGNOSIS — E039 Hypothyroidism, unspecified: Secondary | ICD-10-CM | POA: Diagnosis not present

## 2021-11-22 DIAGNOSIS — I10 Essential (primary) hypertension: Secondary | ICD-10-CM | POA: Diagnosis not present

## 2021-11-22 DIAGNOSIS — E119 Type 2 diabetes mellitus without complications: Secondary | ICD-10-CM | POA: Diagnosis not present

## 2021-11-22 DIAGNOSIS — D518 Other vitamin B12 deficiency anemias: Secondary | ICD-10-CM | POA: Diagnosis not present

## 2021-11-22 DIAGNOSIS — E038 Other specified hypothyroidism: Secondary | ICD-10-CM | POA: Diagnosis not present

## 2021-11-22 DIAGNOSIS — E785 Hyperlipidemia, unspecified: Secondary | ICD-10-CM | POA: Diagnosis not present

## 2021-11-22 DIAGNOSIS — E559 Vitamin D deficiency, unspecified: Secondary | ICD-10-CM | POA: Diagnosis not present

## 2021-12-17 DIAGNOSIS — I10 Essential (primary) hypertension: Secondary | ICD-10-CM | POA: Diagnosis not present

## 2021-12-17 DIAGNOSIS — E7849 Other hyperlipidemia: Secondary | ICD-10-CM | POA: Diagnosis not present

## 2021-12-17 DIAGNOSIS — M1A00X0 Idiopathic chronic gout, unspecified site, without tophus (tophi): Secondary | ICD-10-CM | POA: Diagnosis not present

## 2021-12-17 DIAGNOSIS — E034 Atrophy of thyroid (acquired): Secondary | ICD-10-CM | POA: Diagnosis not present

## 2021-12-17 DIAGNOSIS — E559 Vitamin D deficiency, unspecified: Secondary | ICD-10-CM | POA: Diagnosis not present

## 2021-12-17 DIAGNOSIS — R1013 Epigastric pain: Secondary | ICD-10-CM | POA: Diagnosis not present

## 2021-12-19 DIAGNOSIS — Z0000 Encounter for general adult medical examination without abnormal findings: Secondary | ICD-10-CM | POA: Diagnosis not present

## 2021-12-19 DIAGNOSIS — E034 Atrophy of thyroid (acquired): Secondary | ICD-10-CM | POA: Diagnosis not present

## 2021-12-19 DIAGNOSIS — C50412 Malignant neoplasm of upper-outer quadrant of left female breast: Secondary | ICD-10-CM | POA: Diagnosis not present

## 2021-12-19 DIAGNOSIS — F3342 Major depressive disorder, recurrent, in full remission: Secondary | ICD-10-CM | POA: Diagnosis not present

## 2021-12-19 DIAGNOSIS — E559 Vitamin D deficiency, unspecified: Secondary | ICD-10-CM | POA: Diagnosis not present

## 2021-12-19 DIAGNOSIS — Z23 Encounter for immunization: Secondary | ICD-10-CM | POA: Diagnosis not present

## 2021-12-19 DIAGNOSIS — I10 Essential (primary) hypertension: Secondary | ICD-10-CM | POA: Diagnosis not present

## 2021-12-19 DIAGNOSIS — G301 Alzheimer's disease with late onset: Secondary | ICD-10-CM | POA: Diagnosis not present

## 2021-12-19 DIAGNOSIS — Z780 Asymptomatic menopausal state: Secondary | ICD-10-CM | POA: Diagnosis not present

## 2021-12-19 DIAGNOSIS — E7849 Other hyperlipidemia: Secondary | ICD-10-CM | POA: Diagnosis not present

## 2021-12-19 DIAGNOSIS — K760 Fatty (change of) liver, not elsewhere classified: Secondary | ICD-10-CM | POA: Diagnosis not present

## 2021-12-19 DIAGNOSIS — M1A00X0 Idiopathic chronic gout, unspecified site, without tophus (tophi): Secondary | ICD-10-CM | POA: Diagnosis not present

## 2021-12-26 ENCOUNTER — Other Ambulatory Visit: Payer: Self-pay | Admitting: Internal Medicine

## 2021-12-26 DIAGNOSIS — Z1231 Encounter for screening mammogram for malignant neoplasm of breast: Secondary | ICD-10-CM

## 2021-12-31 DIAGNOSIS — E559 Vitamin D deficiency, unspecified: Secondary | ICD-10-CM | POA: Diagnosis not present

## 2021-12-31 DIAGNOSIS — R260 Ataxic gait: Secondary | ICD-10-CM | POA: Diagnosis not present

## 2021-12-31 DIAGNOSIS — I10 Essential (primary) hypertension: Secondary | ICD-10-CM | POA: Diagnosis not present

## 2021-12-31 DIAGNOSIS — E785 Hyperlipidemia, unspecified: Secondary | ICD-10-CM | POA: Diagnosis not present

## 2021-12-31 DIAGNOSIS — F3342 Major depressive disorder, recurrent, in full remission: Secondary | ICD-10-CM | POA: Diagnosis not present

## 2021-12-31 DIAGNOSIS — M1990 Unspecified osteoarthritis, unspecified site: Secondary | ICD-10-CM | POA: Diagnosis not present

## 2021-12-31 DIAGNOSIS — K30 Functional dyspepsia: Secondary | ICD-10-CM | POA: Diagnosis not present

## 2021-12-31 DIAGNOSIS — C50412 Malignant neoplasm of upper-outer quadrant of left female breast: Secondary | ICD-10-CM | POA: Diagnosis not present

## 2021-12-31 DIAGNOSIS — E034 Atrophy of thyroid (acquired): Secondary | ICD-10-CM | POA: Diagnosis not present

## 2021-12-31 DIAGNOSIS — E7849 Other hyperlipidemia: Secondary | ICD-10-CM | POA: Diagnosis not present

## 2022-01-03 DIAGNOSIS — R2681 Unsteadiness on feet: Secondary | ICD-10-CM | POA: Diagnosis not present

## 2022-01-03 DIAGNOSIS — R2689 Other abnormalities of gait and mobility: Secondary | ICD-10-CM | POA: Diagnosis not present

## 2022-01-03 DIAGNOSIS — F02A0 Dementia in other diseases classified elsewhere, mild, without behavioral disturbance, psychotic disturbance, mood disturbance, and anxiety: Secondary | ICD-10-CM | POA: Diagnosis not present

## 2022-01-03 DIAGNOSIS — M6281 Muscle weakness (generalized): Secondary | ICD-10-CM | POA: Diagnosis not present

## 2022-01-03 DIAGNOSIS — Z9181 History of falling: Secondary | ICD-10-CM | POA: Diagnosis not present

## 2022-01-03 DIAGNOSIS — R279 Unspecified lack of coordination: Secondary | ICD-10-CM | POA: Diagnosis not present

## 2022-01-04 DIAGNOSIS — Z780 Asymptomatic menopausal state: Secondary | ICD-10-CM | POA: Diagnosis not present

## 2022-01-24 DIAGNOSIS — R260 Ataxic gait: Secondary | ICD-10-CM | POA: Diagnosis not present

## 2022-01-24 DIAGNOSIS — E785 Hyperlipidemia, unspecified: Secondary | ICD-10-CM | POA: Diagnosis not present

## 2022-01-24 DIAGNOSIS — E559 Vitamin D deficiency, unspecified: Secondary | ICD-10-CM | POA: Diagnosis not present

## 2022-01-24 DIAGNOSIS — M1990 Unspecified osteoarthritis, unspecified site: Secondary | ICD-10-CM | POA: Diagnosis not present

## 2022-01-24 DIAGNOSIS — I10 Essential (primary) hypertension: Secondary | ICD-10-CM | POA: Diagnosis not present

## 2022-01-24 DIAGNOSIS — C50412 Malignant neoplasm of upper-outer quadrant of left female breast: Secondary | ICD-10-CM | POA: Diagnosis not present

## 2022-01-24 DIAGNOSIS — K30 Functional dyspepsia: Secondary | ICD-10-CM | POA: Diagnosis not present

## 2022-01-25 ENCOUNTER — Ambulatory Visit
Admission: RE | Admit: 2022-01-25 | Discharge: 2022-01-25 | Disposition: A | Discharge status: Home / Self Care | Payer: PPO | Source: Ambulatory Visit | Attending: Internal Medicine | Admitting: Internal Medicine

## 2022-01-25 DIAGNOSIS — Z1231 Encounter for screening mammogram for malignant neoplasm of breast: Secondary | ICD-10-CM | POA: Insufficient documentation

## 2022-01-28 DIAGNOSIS — Z9181 History of falling: Secondary | ICD-10-CM | POA: Diagnosis not present

## 2022-01-28 DIAGNOSIS — R279 Unspecified lack of coordination: Secondary | ICD-10-CM | POA: Diagnosis not present

## 2022-01-28 DIAGNOSIS — R2681 Unsteadiness on feet: Secondary | ICD-10-CM | POA: Diagnosis not present

## 2022-01-28 DIAGNOSIS — F02A0 Dementia in other diseases classified elsewhere, mild, without behavioral disturbance, psychotic disturbance, mood disturbance, and anxiety: Secondary | ICD-10-CM | POA: Diagnosis not present

## 2022-01-28 DIAGNOSIS — M6281 Muscle weakness (generalized): Secondary | ICD-10-CM | POA: Diagnosis not present

## 2022-01-28 DIAGNOSIS — R2689 Other abnormalities of gait and mobility: Secondary | ICD-10-CM | POA: Diagnosis not present

## 2022-01-30 DIAGNOSIS — M6281 Muscle weakness (generalized): Secondary | ICD-10-CM | POA: Diagnosis not present

## 2022-01-30 DIAGNOSIS — Z9181 History of falling: Secondary | ICD-10-CM | POA: Diagnosis not present

## 2022-01-30 DIAGNOSIS — R2689 Other abnormalities of gait and mobility: Secondary | ICD-10-CM | POA: Diagnosis not present

## 2022-01-30 DIAGNOSIS — R2681 Unsteadiness on feet: Secondary | ICD-10-CM | POA: Diagnosis not present

## 2022-01-30 DIAGNOSIS — F02A0 Dementia in other diseases classified elsewhere, mild, without behavioral disturbance, psychotic disturbance, mood disturbance, and anxiety: Secondary | ICD-10-CM | POA: Diagnosis not present

## 2022-01-30 DIAGNOSIS — R279 Unspecified lack of coordination: Secondary | ICD-10-CM | POA: Diagnosis not present

## 2022-02-07 DIAGNOSIS — R278 Other lack of coordination: Secondary | ICD-10-CM | POA: Diagnosis not present

## 2022-02-07 DIAGNOSIS — R262 Difficulty in walking, not elsewhere classified: Secondary | ICD-10-CM | POA: Diagnosis not present

## 2022-02-08 DIAGNOSIS — R296 Repeated falls: Secondary | ICD-10-CM | POA: Diagnosis not present

## 2022-02-08 DIAGNOSIS — M2569 Stiffness of other specified joint, not elsewhere classified: Secondary | ICD-10-CM | POA: Diagnosis not present

## 2022-02-09 DIAGNOSIS — M542 Cervicalgia: Secondary | ICD-10-CM | POA: Diagnosis not present

## 2022-02-09 DIAGNOSIS — M25512 Pain in left shoulder: Secondary | ICD-10-CM | POA: Diagnosis not present

## 2022-02-11 DIAGNOSIS — R278 Other lack of coordination: Secondary | ICD-10-CM | POA: Diagnosis not present

## 2022-02-11 DIAGNOSIS — R296 Repeated falls: Secondary | ICD-10-CM | POA: Diagnosis not present

## 2022-02-11 DIAGNOSIS — M2569 Stiffness of other specified joint, not elsewhere classified: Secondary | ICD-10-CM | POA: Diagnosis not present

## 2022-02-11 DIAGNOSIS — R262 Difficulty in walking, not elsewhere classified: Secondary | ICD-10-CM | POA: Diagnosis not present

## 2022-02-12 DIAGNOSIS — M2569 Stiffness of other specified joint, not elsewhere classified: Secondary | ICD-10-CM | POA: Diagnosis not present

## 2022-02-12 DIAGNOSIS — R296 Repeated falls: Secondary | ICD-10-CM | POA: Diagnosis not present

## 2022-02-12 DIAGNOSIS — R278 Other lack of coordination: Secondary | ICD-10-CM | POA: Diagnosis not present

## 2022-02-12 DIAGNOSIS — R262 Difficulty in walking, not elsewhere classified: Secondary | ICD-10-CM | POA: Diagnosis not present

## 2022-02-13 DIAGNOSIS — E559 Vitamin D deficiency, unspecified: Secondary | ICD-10-CM | POA: Diagnosis not present

## 2022-02-13 DIAGNOSIS — R278 Other lack of coordination: Secondary | ICD-10-CM | POA: Diagnosis not present

## 2022-02-13 DIAGNOSIS — M2569 Stiffness of other specified joint, not elsewhere classified: Secondary | ICD-10-CM | POA: Diagnosis not present

## 2022-02-13 DIAGNOSIS — M62838 Other muscle spasm: Secondary | ICD-10-CM | POA: Diagnosis not present

## 2022-02-13 DIAGNOSIS — E785 Hyperlipidemia, unspecified: Secondary | ICD-10-CM | POA: Diagnosis not present

## 2022-02-13 DIAGNOSIS — E039 Hypothyroidism, unspecified: Secondary | ICD-10-CM | POA: Diagnosis not present

## 2022-02-13 DIAGNOSIS — F0390 Unspecified dementia without behavioral disturbance: Secondary | ICD-10-CM | POA: Diagnosis not present

## 2022-02-13 DIAGNOSIS — M1990 Unspecified osteoarthritis, unspecified site: Secondary | ICD-10-CM | POA: Diagnosis not present

## 2022-02-13 DIAGNOSIS — R296 Repeated falls: Secondary | ICD-10-CM | POA: Diagnosis not present

## 2022-02-13 DIAGNOSIS — R262 Difficulty in walking, not elsewhere classified: Secondary | ICD-10-CM | POA: Diagnosis not present

## 2022-02-13 DIAGNOSIS — I10 Essential (primary) hypertension: Secondary | ICD-10-CM | POA: Diagnosis not present

## 2022-02-13 DIAGNOSIS — K219 Gastro-esophageal reflux disease without esophagitis: Secondary | ICD-10-CM | POA: Diagnosis not present

## 2022-02-16 DIAGNOSIS — I10 Essential (primary) hypertension: Secondary | ICD-10-CM | POA: Diagnosis not present

## 2022-02-19 DIAGNOSIS — M2569 Stiffness of other specified joint, not elsewhere classified: Secondary | ICD-10-CM | POA: Diagnosis not present

## 2022-02-19 DIAGNOSIS — R296 Repeated falls: Secondary | ICD-10-CM | POA: Diagnosis not present

## 2022-02-20 DIAGNOSIS — E782 Mixed hyperlipidemia: Secondary | ICD-10-CM | POA: Diagnosis not present

## 2022-02-20 DIAGNOSIS — D518 Other vitamin B12 deficiency anemias: Secondary | ICD-10-CM | POA: Diagnosis not present

## 2022-02-20 DIAGNOSIS — E119 Type 2 diabetes mellitus without complications: Secondary | ICD-10-CM | POA: Diagnosis not present

## 2022-02-20 DIAGNOSIS — E038 Other specified hypothyroidism: Secondary | ICD-10-CM | POA: Diagnosis not present

## 2022-02-20 DIAGNOSIS — E039 Hypothyroidism, unspecified: Secondary | ICD-10-CM | POA: Diagnosis not present

## 2022-02-20 DIAGNOSIS — E559 Vitamin D deficiency, unspecified: Secondary | ICD-10-CM | POA: Diagnosis not present

## 2022-02-20 DIAGNOSIS — E785 Hyperlipidemia, unspecified: Secondary | ICD-10-CM | POA: Diagnosis not present

## 2022-02-20 DIAGNOSIS — I10 Essential (primary) hypertension: Secondary | ICD-10-CM | POA: Diagnosis not present

## 2022-02-21 DIAGNOSIS — M1990 Unspecified osteoarthritis, unspecified site: Secondary | ICD-10-CM | POA: Diagnosis not present

## 2022-02-21 DIAGNOSIS — K219 Gastro-esophageal reflux disease without esophagitis: Secondary | ICD-10-CM | POA: Diagnosis not present

## 2022-02-21 DIAGNOSIS — E559 Vitamin D deficiency, unspecified: Secondary | ICD-10-CM | POA: Diagnosis not present

## 2022-02-21 DIAGNOSIS — H6690 Otitis media, unspecified, unspecified ear: Secondary | ICD-10-CM | POA: Diagnosis not present

## 2022-02-21 DIAGNOSIS — R262 Difficulty in walking, not elsewhere classified: Secondary | ICD-10-CM | POA: Diagnosis not present

## 2022-02-21 DIAGNOSIS — E039 Hypothyroidism, unspecified: Secondary | ICD-10-CM | POA: Diagnosis not present

## 2022-02-21 DIAGNOSIS — R278 Other lack of coordination: Secondary | ICD-10-CM | POA: Diagnosis not present

## 2022-02-21 DIAGNOSIS — I10 Essential (primary) hypertension: Secondary | ICD-10-CM | POA: Diagnosis not present

## 2022-02-21 DIAGNOSIS — E785 Hyperlipidemia, unspecified: Secondary | ICD-10-CM | POA: Diagnosis not present

## 2022-02-21 DIAGNOSIS — M62838 Other muscle spasm: Secondary | ICD-10-CM | POA: Diagnosis not present

## 2022-02-26 DIAGNOSIS — E559 Vitamin D deficiency, unspecified: Secondary | ICD-10-CM | POA: Diagnosis not present

## 2022-02-26 DIAGNOSIS — D518 Other vitamin B12 deficiency anemias: Secondary | ICD-10-CM | POA: Diagnosis not present

## 2022-02-26 DIAGNOSIS — E119 Type 2 diabetes mellitus without complications: Secondary | ICD-10-CM | POA: Diagnosis not present

## 2022-02-26 DIAGNOSIS — Z79899 Other long term (current) drug therapy: Secondary | ICD-10-CM | POA: Diagnosis not present

## 2022-02-26 DIAGNOSIS — E038 Other specified hypothyroidism: Secondary | ICD-10-CM | POA: Diagnosis not present

## 2022-02-26 DIAGNOSIS — E782 Mixed hyperlipidemia: Secondary | ICD-10-CM | POA: Diagnosis not present

## 2022-02-27 DIAGNOSIS — R262 Difficulty in walking, not elsewhere classified: Secondary | ICD-10-CM | POA: Diagnosis not present

## 2022-02-27 DIAGNOSIS — M2569 Stiffness of other specified joint, not elsewhere classified: Secondary | ICD-10-CM | POA: Diagnosis not present

## 2022-02-27 DIAGNOSIS — R278 Other lack of coordination: Secondary | ICD-10-CM | POA: Diagnosis not present

## 2022-02-27 DIAGNOSIS — R296 Repeated falls: Secondary | ICD-10-CM | POA: Diagnosis not present

## 2022-02-28 DIAGNOSIS — R296 Repeated falls: Secondary | ICD-10-CM | POA: Diagnosis not present

## 2022-02-28 DIAGNOSIS — M2569 Stiffness of other specified joint, not elsewhere classified: Secondary | ICD-10-CM | POA: Diagnosis not present

## 2022-03-05 DIAGNOSIS — M2569 Stiffness of other specified joint, not elsewhere classified: Secondary | ICD-10-CM | POA: Diagnosis not present

## 2022-03-05 DIAGNOSIS — R296 Repeated falls: Secondary | ICD-10-CM | POA: Diagnosis not present

## 2022-03-05 DIAGNOSIS — R278 Other lack of coordination: Secondary | ICD-10-CM | POA: Diagnosis not present

## 2022-03-05 DIAGNOSIS — R262 Difficulty in walking, not elsewhere classified: Secondary | ICD-10-CM | POA: Diagnosis not present

## 2022-03-06 DIAGNOSIS — K219 Gastro-esophageal reflux disease without esophagitis: Secondary | ICD-10-CM | POA: Diagnosis not present

## 2022-03-06 DIAGNOSIS — K529 Noninfective gastroenteritis and colitis, unspecified: Secondary | ICD-10-CM | POA: Diagnosis not present

## 2022-03-06 DIAGNOSIS — E559 Vitamin D deficiency, unspecified: Secondary | ICD-10-CM | POA: Diagnosis not present

## 2022-03-06 DIAGNOSIS — E039 Hypothyroidism, unspecified: Secondary | ICD-10-CM | POA: Diagnosis not present

## 2022-03-06 DIAGNOSIS — I10 Essential (primary) hypertension: Secondary | ICD-10-CM | POA: Diagnosis not present

## 2022-03-06 DIAGNOSIS — E785 Hyperlipidemia, unspecified: Secondary | ICD-10-CM | POA: Diagnosis not present

## 2022-03-06 DIAGNOSIS — M62838 Other muscle spasm: Secondary | ICD-10-CM | POA: Diagnosis not present

## 2022-03-06 DIAGNOSIS — F0390 Unspecified dementia without behavioral disturbance: Secondary | ICD-10-CM | POA: Diagnosis not present

## 2022-03-07 DIAGNOSIS — R262 Difficulty in walking, not elsewhere classified: Secondary | ICD-10-CM | POA: Diagnosis not present

## 2022-03-07 DIAGNOSIS — R278 Other lack of coordination: Secondary | ICD-10-CM | POA: Diagnosis not present

## 2022-03-11 DIAGNOSIS — Z79899 Other long term (current) drug therapy: Secondary | ICD-10-CM | POA: Diagnosis not present

## 2022-03-11 DIAGNOSIS — E038 Other specified hypothyroidism: Secondary | ICD-10-CM | POA: Diagnosis not present

## 2022-03-11 DIAGNOSIS — E782 Mixed hyperlipidemia: Secondary | ICD-10-CM | POA: Diagnosis not present

## 2022-03-11 DIAGNOSIS — D518 Other vitamin B12 deficiency anemias: Secondary | ICD-10-CM | POA: Diagnosis not present

## 2022-03-11 DIAGNOSIS — E119 Type 2 diabetes mellitus without complications: Secondary | ICD-10-CM | POA: Diagnosis not present

## 2022-03-12 DIAGNOSIS — M1990 Unspecified osteoarthritis, unspecified site: Secondary | ICD-10-CM | POA: Diagnosis not present

## 2022-03-12 DIAGNOSIS — R262 Difficulty in walking, not elsewhere classified: Secondary | ICD-10-CM | POA: Diagnosis not present

## 2022-03-12 DIAGNOSIS — K529 Noninfective gastroenteritis and colitis, unspecified: Secondary | ICD-10-CM | POA: Diagnosis not present

## 2022-03-12 DIAGNOSIS — R278 Other lack of coordination: Secondary | ICD-10-CM | POA: Diagnosis not present

## 2022-03-12 DIAGNOSIS — K219 Gastro-esophageal reflux disease without esophagitis: Secondary | ICD-10-CM | POA: Diagnosis not present

## 2022-03-12 DIAGNOSIS — I10 Essential (primary) hypertension: Secondary | ICD-10-CM | POA: Diagnosis not present

## 2022-03-12 DIAGNOSIS — M62838 Other muscle spasm: Secondary | ICD-10-CM | POA: Diagnosis not present

## 2022-03-12 DIAGNOSIS — F0390 Unspecified dementia without behavioral disturbance: Secondary | ICD-10-CM | POA: Diagnosis not present

## 2022-03-12 DIAGNOSIS — E039 Hypothyroidism, unspecified: Secondary | ICD-10-CM | POA: Diagnosis not present

## 2022-03-12 DIAGNOSIS — E559 Vitamin D deficiency, unspecified: Secondary | ICD-10-CM | POA: Diagnosis not present

## 2022-03-12 DIAGNOSIS — E785 Hyperlipidemia, unspecified: Secondary | ICD-10-CM | POA: Diagnosis not present

## 2022-03-12 DIAGNOSIS — F419 Anxiety disorder, unspecified: Secondary | ICD-10-CM | POA: Diagnosis not present

## 2022-03-13 DIAGNOSIS — M2569 Stiffness of other specified joint, not elsewhere classified: Secondary | ICD-10-CM | POA: Diagnosis not present

## 2022-03-13 DIAGNOSIS — R278 Other lack of coordination: Secondary | ICD-10-CM | POA: Diagnosis not present

## 2022-03-13 DIAGNOSIS — R296 Repeated falls: Secondary | ICD-10-CM | POA: Diagnosis not present

## 2022-03-13 DIAGNOSIS — R262 Difficulty in walking, not elsewhere classified: Secondary | ICD-10-CM | POA: Diagnosis not present

## 2022-03-24 DIAGNOSIS — D518 Other vitamin B12 deficiency anemias: Secondary | ICD-10-CM | POA: Diagnosis not present

## 2022-03-24 DIAGNOSIS — E782 Mixed hyperlipidemia: Secondary | ICD-10-CM | POA: Diagnosis not present

## 2022-03-24 DIAGNOSIS — E039 Hypothyroidism, unspecified: Secondary | ICD-10-CM | POA: Diagnosis not present

## 2022-03-24 DIAGNOSIS — I10 Essential (primary) hypertension: Secondary | ICD-10-CM | POA: Diagnosis not present

## 2022-03-24 DIAGNOSIS — E038 Other specified hypothyroidism: Secondary | ICD-10-CM | POA: Diagnosis not present

## 2022-03-24 DIAGNOSIS — E785 Hyperlipidemia, unspecified: Secondary | ICD-10-CM | POA: Diagnosis not present

## 2022-03-24 DIAGNOSIS — E119 Type 2 diabetes mellitus without complications: Secondary | ICD-10-CM | POA: Diagnosis not present

## 2022-03-24 DIAGNOSIS — E559 Vitamin D deficiency, unspecified: Secondary | ICD-10-CM | POA: Diagnosis not present

## 2022-03-27 DIAGNOSIS — E782 Mixed hyperlipidemia: Secondary | ICD-10-CM | POA: Diagnosis not present

## 2022-03-27 DIAGNOSIS — Z79899 Other long term (current) drug therapy: Secondary | ICD-10-CM | POA: Diagnosis not present

## 2022-03-27 DIAGNOSIS — D518 Other vitamin B12 deficiency anemias: Secondary | ICD-10-CM | POA: Diagnosis not present

## 2022-03-27 DIAGNOSIS — E119 Type 2 diabetes mellitus without complications: Secondary | ICD-10-CM | POA: Diagnosis not present

## 2022-03-28 DIAGNOSIS — R296 Repeated falls: Secondary | ICD-10-CM | POA: Diagnosis not present

## 2022-03-28 DIAGNOSIS — R278 Other lack of coordination: Secondary | ICD-10-CM | POA: Diagnosis not present

## 2022-03-28 DIAGNOSIS — M2569 Stiffness of other specified joint, not elsewhere classified: Secondary | ICD-10-CM | POA: Diagnosis not present

## 2022-03-28 DIAGNOSIS — R262 Difficulty in walking, not elsewhere classified: Secondary | ICD-10-CM | POA: Diagnosis not present

## 2022-04-02 DIAGNOSIS — R262 Difficulty in walking, not elsewhere classified: Secondary | ICD-10-CM | POA: Diagnosis not present

## 2022-04-02 DIAGNOSIS — M2569 Stiffness of other specified joint, not elsewhere classified: Secondary | ICD-10-CM | POA: Diagnosis not present

## 2022-04-02 DIAGNOSIS — R278 Other lack of coordination: Secondary | ICD-10-CM | POA: Diagnosis not present

## 2022-04-02 DIAGNOSIS — R296 Repeated falls: Secondary | ICD-10-CM | POA: Diagnosis not present

## 2022-04-04 DIAGNOSIS — R262 Difficulty in walking, not elsewhere classified: Secondary | ICD-10-CM | POA: Diagnosis not present

## 2022-04-04 DIAGNOSIS — R278 Other lack of coordination: Secondary | ICD-10-CM | POA: Diagnosis not present

## 2022-04-08 DIAGNOSIS — R262 Difficulty in walking, not elsewhere classified: Secondary | ICD-10-CM | POA: Diagnosis not present

## 2022-04-08 DIAGNOSIS — R278 Other lack of coordination: Secondary | ICD-10-CM | POA: Diagnosis not present

## 2022-04-10 DIAGNOSIS — R278 Other lack of coordination: Secondary | ICD-10-CM | POA: Diagnosis not present

## 2022-04-10 DIAGNOSIS — R262 Difficulty in walking, not elsewhere classified: Secondary | ICD-10-CM | POA: Diagnosis not present

## 2022-04-12 DIAGNOSIS — R011 Cardiac murmur, unspecified: Secondary | ICD-10-CM | POA: Diagnosis not present

## 2022-04-16 DIAGNOSIS — R262 Difficulty in walking, not elsewhere classified: Secondary | ICD-10-CM | POA: Diagnosis not present

## 2022-04-16 DIAGNOSIS — R278 Other lack of coordination: Secondary | ICD-10-CM | POA: Diagnosis not present

## 2022-04-18 DIAGNOSIS — E039 Hypothyroidism, unspecified: Secondary | ICD-10-CM | POA: Diagnosis not present

## 2022-04-18 DIAGNOSIS — E785 Hyperlipidemia, unspecified: Secondary | ICD-10-CM | POA: Diagnosis not present

## 2022-04-18 DIAGNOSIS — R278 Other lack of coordination: Secondary | ICD-10-CM | POA: Diagnosis not present

## 2022-04-18 DIAGNOSIS — I10 Essential (primary) hypertension: Secondary | ICD-10-CM | POA: Diagnosis not present

## 2022-04-18 DIAGNOSIS — E559 Vitamin D deficiency, unspecified: Secondary | ICD-10-CM | POA: Diagnosis not present

## 2022-04-18 DIAGNOSIS — K219 Gastro-esophageal reflux disease without esophagitis: Secondary | ICD-10-CM | POA: Diagnosis not present

## 2022-04-18 DIAGNOSIS — F0390 Unspecified dementia without behavioral disturbance: Secondary | ICD-10-CM | POA: Diagnosis not present

## 2022-04-18 DIAGNOSIS — M1990 Unspecified osteoarthritis, unspecified site: Secondary | ICD-10-CM | POA: Diagnosis not present

## 2022-04-18 DIAGNOSIS — K529 Noninfective gastroenteritis and colitis, unspecified: Secondary | ICD-10-CM | POA: Diagnosis not present

## 2022-04-18 DIAGNOSIS — R262 Difficulty in walking, not elsewhere classified: Secondary | ICD-10-CM | POA: Diagnosis not present

## 2022-04-19 DIAGNOSIS — I10 Essential (primary) hypertension: Secondary | ICD-10-CM | POA: Diagnosis not present

## 2022-04-24 DIAGNOSIS — E119 Type 2 diabetes mellitus without complications: Secondary | ICD-10-CM | POA: Diagnosis not present

## 2022-04-24 DIAGNOSIS — E785 Hyperlipidemia, unspecified: Secondary | ICD-10-CM | POA: Diagnosis not present

## 2022-04-24 DIAGNOSIS — E038 Other specified hypothyroidism: Secondary | ICD-10-CM | POA: Diagnosis not present

## 2022-04-24 DIAGNOSIS — E559 Vitamin D deficiency, unspecified: Secondary | ICD-10-CM | POA: Diagnosis not present

## 2022-04-24 DIAGNOSIS — E782 Mixed hyperlipidemia: Secondary | ICD-10-CM | POA: Diagnosis not present

## 2022-04-24 DIAGNOSIS — E039 Hypothyroidism, unspecified: Secondary | ICD-10-CM | POA: Diagnosis not present

## 2022-04-24 DIAGNOSIS — I10 Essential (primary) hypertension: Secondary | ICD-10-CM | POA: Diagnosis not present

## 2022-04-24 DIAGNOSIS — D518 Other vitamin B12 deficiency anemias: Secondary | ICD-10-CM | POA: Diagnosis not present

## 2022-04-25 DIAGNOSIS — R278 Other lack of coordination: Secondary | ICD-10-CM | POA: Diagnosis not present

## 2022-04-25 DIAGNOSIS — R262 Difficulty in walking, not elsewhere classified: Secondary | ICD-10-CM | POA: Diagnosis not present

## 2022-04-30 DIAGNOSIS — R262 Difficulty in walking, not elsewhere classified: Secondary | ICD-10-CM | POA: Diagnosis not present

## 2022-04-30 DIAGNOSIS — R278 Other lack of coordination: Secondary | ICD-10-CM | POA: Diagnosis not present

## 2022-05-02 DIAGNOSIS — R262 Difficulty in walking, not elsewhere classified: Secondary | ICD-10-CM | POA: Diagnosis not present

## 2022-05-02 DIAGNOSIS — R278 Other lack of coordination: Secondary | ICD-10-CM | POA: Diagnosis not present

## 2022-05-07 DIAGNOSIS — R262 Difficulty in walking, not elsewhere classified: Secondary | ICD-10-CM | POA: Diagnosis not present

## 2022-05-07 DIAGNOSIS — R278 Other lack of coordination: Secondary | ICD-10-CM | POA: Diagnosis not present

## 2022-05-16 DIAGNOSIS — E559 Vitamin D deficiency, unspecified: Secondary | ICD-10-CM | POA: Diagnosis not present

## 2022-05-16 DIAGNOSIS — E039 Hypothyroidism, unspecified: Secondary | ICD-10-CM | POA: Diagnosis not present

## 2022-05-16 DIAGNOSIS — R262 Difficulty in walking, not elsewhere classified: Secondary | ICD-10-CM | POA: Diagnosis not present

## 2022-05-16 DIAGNOSIS — K219 Gastro-esophageal reflux disease without esophagitis: Secondary | ICD-10-CM | POA: Diagnosis not present

## 2022-05-16 DIAGNOSIS — E785 Hyperlipidemia, unspecified: Secondary | ICD-10-CM | POA: Diagnosis not present

## 2022-05-16 DIAGNOSIS — R278 Other lack of coordination: Secondary | ICD-10-CM | POA: Diagnosis not present

## 2022-05-16 DIAGNOSIS — I10 Essential (primary) hypertension: Secondary | ICD-10-CM | POA: Diagnosis not present

## 2022-05-16 DIAGNOSIS — F419 Anxiety disorder, unspecified: Secondary | ICD-10-CM | POA: Diagnosis not present

## 2022-05-16 DIAGNOSIS — M1990 Unspecified osteoarthritis, unspecified site: Secondary | ICD-10-CM | POA: Diagnosis not present

## 2022-05-20 DIAGNOSIS — I10 Essential (primary) hypertension: Secondary | ICD-10-CM | POA: Diagnosis not present

## 2022-05-21 DIAGNOSIS — R278 Other lack of coordination: Secondary | ICD-10-CM | POA: Diagnosis not present

## 2022-05-21 DIAGNOSIS — R262 Difficulty in walking, not elsewhere classified: Secondary | ICD-10-CM | POA: Diagnosis not present

## 2022-05-22 DIAGNOSIS — E785 Hyperlipidemia, unspecified: Secondary | ICD-10-CM | POA: Diagnosis not present

## 2022-05-22 DIAGNOSIS — E559 Vitamin D deficiency, unspecified: Secondary | ICD-10-CM | POA: Diagnosis not present

## 2022-05-22 DIAGNOSIS — E119 Type 2 diabetes mellitus without complications: Secondary | ICD-10-CM | POA: Diagnosis not present

## 2022-05-22 DIAGNOSIS — E038 Other specified hypothyroidism: Secondary | ICD-10-CM | POA: Diagnosis not present

## 2022-05-22 DIAGNOSIS — E039 Hypothyroidism, unspecified: Secondary | ICD-10-CM | POA: Diagnosis not present

## 2022-05-22 DIAGNOSIS — D518 Other vitamin B12 deficiency anemias: Secondary | ICD-10-CM | POA: Diagnosis not present

## 2022-05-22 DIAGNOSIS — E782 Mixed hyperlipidemia: Secondary | ICD-10-CM | POA: Diagnosis not present

## 2022-05-22 DIAGNOSIS — I10 Essential (primary) hypertension: Secondary | ICD-10-CM | POA: Diagnosis not present

## 2022-05-24 DIAGNOSIS — R531 Weakness: Secondary | ICD-10-CM | POA: Diagnosis not present

## 2022-05-24 DIAGNOSIS — R5382 Chronic fatigue, unspecified: Secondary | ICD-10-CM | POA: Diagnosis not present

## 2022-05-24 DIAGNOSIS — J310 Chronic rhinitis: Secondary | ICD-10-CM | POA: Diagnosis not present

## 2022-05-24 DIAGNOSIS — Z20822 Contact with and (suspected) exposure to covid-19: Secondary | ICD-10-CM | POA: Diagnosis not present

## 2022-05-27 DIAGNOSIS — E038 Other specified hypothyroidism: Secondary | ICD-10-CM | POA: Diagnosis not present

## 2022-05-27 DIAGNOSIS — Z79899 Other long term (current) drug therapy: Secondary | ICD-10-CM | POA: Diagnosis not present

## 2022-05-27 DIAGNOSIS — E119 Type 2 diabetes mellitus without complications: Secondary | ICD-10-CM | POA: Diagnosis not present

## 2022-05-27 DIAGNOSIS — D518 Other vitamin B12 deficiency anemias: Secondary | ICD-10-CM | POA: Diagnosis not present

## 2022-05-27 DIAGNOSIS — E782 Mixed hyperlipidemia: Secondary | ICD-10-CM | POA: Diagnosis not present

## 2022-06-04 DIAGNOSIS — R262 Difficulty in walking, not elsewhere classified: Secondary | ICD-10-CM | POA: Diagnosis not present

## 2022-06-04 DIAGNOSIS — R278 Other lack of coordination: Secondary | ICD-10-CM | POA: Diagnosis not present

## 2022-06-08 ENCOUNTER — Emergency Department
Admission: EM | Admit: 2022-06-08 | Discharge: 2022-06-08 | Disposition: A | Discharge status: Home / Self Care | Payer: PPO | Attending: Emergency Medicine | Admitting: Emergency Medicine

## 2022-06-08 ENCOUNTER — Emergency Department: Payer: PPO

## 2022-06-08 ENCOUNTER — Encounter: Payer: Self-pay | Admitting: Radiology

## 2022-06-08 DIAGNOSIS — R296 Repeated falls: Secondary | ICD-10-CM

## 2022-06-08 DIAGNOSIS — F0390 Unspecified dementia without behavioral disturbance: Secondary | ICD-10-CM | POA: Insufficient documentation

## 2022-06-08 DIAGNOSIS — E039 Hypothyroidism, unspecified: Secondary | ICD-10-CM | POA: Diagnosis not present

## 2022-06-08 DIAGNOSIS — Z79899 Other long term (current) drug therapy: Secondary | ICD-10-CM | POA: Diagnosis not present

## 2022-06-08 DIAGNOSIS — W19XXXA Unspecified fall, initial encounter: Secondary | ICD-10-CM | POA: Diagnosis not present

## 2022-06-08 DIAGNOSIS — Z9012 Acquired absence of left breast and nipple: Secondary | ICD-10-CM | POA: Diagnosis not present

## 2022-06-08 DIAGNOSIS — F10129 Alcohol abuse with intoxication, unspecified: Secondary | ICD-10-CM | POA: Insufficient documentation

## 2022-06-08 DIAGNOSIS — Y907 Blood alcohol level of 200-239 mg/100 ml: Secondary | ICD-10-CM | POA: Insufficient documentation

## 2022-06-08 DIAGNOSIS — Z853 Personal history of malignant neoplasm of breast: Secondary | ICD-10-CM | POA: Insufficient documentation

## 2022-06-08 DIAGNOSIS — S0990XA Unspecified injury of head, initial encounter: Secondary | ICD-10-CM | POA: Diagnosis not present

## 2022-06-08 DIAGNOSIS — I10 Essential (primary) hypertension: Secondary | ICD-10-CM | POA: Insufficient documentation

## 2022-06-08 DIAGNOSIS — F10929 Alcohol use, unspecified with intoxication, unspecified: Secondary | ICD-10-CM

## 2022-06-08 DIAGNOSIS — S12100D Unspecified displaced fracture of second cervical vertebra, subsequent encounter for fracture with routine healing: Secondary | ICD-10-CM | POA: Diagnosis not present

## 2022-06-08 DIAGNOSIS — R0902 Hypoxemia: Secondary | ICD-10-CM | POA: Diagnosis not present

## 2022-06-08 DIAGNOSIS — I959 Hypotension, unspecified: Secondary | ICD-10-CM | POA: Diagnosis not present

## 2022-06-08 DIAGNOSIS — R000 Tachycardia, unspecified: Secondary | ICD-10-CM | POA: Diagnosis not present

## 2022-06-08 LAB — CBC WITH DIFFERENTIAL/PLATELET
Abs Immature Granulocytes: 0.02 10*3/uL (ref 0.00–0.07)
Basophils Absolute: 0.1 10*3/uL (ref 0.0–0.1)
Basophils Relative: 1 %
Eosinophils Absolute: 0.4 10*3/uL (ref 0.0–0.5)
Eosinophils Relative: 4 %
HCT: 40.4 % (ref 36.0–46.0)
Hemoglobin: 12.9 g/dL (ref 12.0–15.0)
Immature Granulocytes: 0 %
Lymphocytes Relative: 44 %
Lymphs Abs: 4.1 10*3/uL — ABNORMAL HIGH (ref 0.7–4.0)
MCH: 27.8 pg (ref 26.0–34.0)
MCHC: 31.9 g/dL (ref 30.0–36.0)
MCV: 87.1 fL (ref 80.0–100.0)
Monocytes Absolute: 1 10*3/uL (ref 0.1–1.0)
Monocytes Relative: 10 %
Neutro Abs: 3.8 10*3/uL (ref 1.7–7.7)
Neutrophils Relative %: 41 %
Platelets: 482 10*3/uL — ABNORMAL HIGH (ref 150–400)
RBC: 4.64 MIL/uL (ref 3.87–5.11)
RDW: 14.7 % (ref 11.5–15.5)
WBC: 9.3 10*3/uL (ref 4.0–10.5)
nRBC: 0 % (ref 0.0–0.2)

## 2022-06-08 LAB — COMPREHENSIVE METABOLIC PANEL
ALT: 15 U/L (ref 0–44)
AST: 16 U/L (ref 15–41)
Albumin: 3.9 g/dL (ref 3.5–5.0)
Alkaline Phosphatase: 94 U/L (ref 38–126)
Anion gap: 11 (ref 5–15)
BUN: 8 mg/dL (ref 8–23)
CO2: 24 mmol/L (ref 22–32)
Calcium: 9 mg/dL (ref 8.9–10.3)
Chloride: 108 mmol/L (ref 98–111)
Creatinine, Ser: 0.68 mg/dL (ref 0.44–1.00)
GFR, Estimated: >60 mL/min (ref >60)
Glucose, Bld: 111 mg/dL — ABNORMAL HIGH (ref 70–99)
Potassium: 3.8 mmol/L (ref 3.5–5.1)
Sodium: 143 mmol/L (ref 135–145)
Total Bilirubin: 0.3 mg/dL (ref 0.3–1.2)
Total Protein: 7.8 g/dL (ref 6.5–8.1)

## 2022-06-08 LAB — URINALYSIS, ROUTINE W REFLEX MICROSCOPIC
Bilirubin Urine: NEGATIVE
Glucose, UA: NEGATIVE mg/dL
Ketones, ur: NEGATIVE mg/dL
Leukocytes,Ua: NEGATIVE
Nitrite: NEGATIVE
Protein, ur: NEGATIVE mg/dL
Specific Gravity, Urine: 1.004 — ABNORMAL LOW (ref 1.005–1.030)
pH: 5 (ref 5.0–8.0)

## 2022-06-08 LAB — URINE DRUG SCREEN, QUALITATIVE (ARMC ONLY)
Amphetamines, Ur Screen: NOT DETECTED
Barbiturates, Ur Screen: NOT DETECTED
Benzodiazepine, Ur Scrn: NOT DETECTED
Cannabinoid 50 Ng, Ur ~~LOC~~: NOT DETECTED
Cocaine Metabolite,Ur ~~LOC~~: NOT DETECTED
MDMA (Ecstasy)Ur Screen: NOT DETECTED
Methadone Scn, Ur: NOT DETECTED
Opiate, Ur Screen: NOT DETECTED
Phencyclidine (PCP) Ur S: NOT DETECTED
Tricyclic, Ur Screen: NOT DETECTED

## 2022-06-08 LAB — ETHANOL: Alcohol, Ethyl (B): 237 mg/dL — ABNORMAL HIGH (ref <10)

## 2022-06-08 MED ORDER — SODIUM CHLORIDE 0.9 % IV BOLUS (SEPSIS)
1000.0000 mL | Freq: Once | INTRAVENOUS | Status: AC
Start: 2022-06-08 — End: 2022-06-08
  Administered 2022-06-08: 1000 mL via INTRAVENOUS

## 2022-06-08 NOTE — ED Notes (Signed)
Patient transported to MRI 

## 2022-06-08 NOTE — ED Notes (Signed)
Pt called friend Pricilla Handler who's staying at [facility] to come pick her up. Pt was on the phone, this RN present while talking to friend. Per friend, she will come get patient. Pt ao x 4, ambulatory with steady gait.

## 2022-06-08 NOTE — ED Notes (Signed)
[facility] called for transport to [facility] 

## 2022-06-08 NOTE — ED Provider Notes (Signed)
[facility] Provider Note    Event Date/Time   First MD Initiated Contact with Patient 06/08/22 0340     (approximate)   History   Fall   HPI  Christy Braun is a 77 y.o. female with history of hypertension, hyperlipidemia, dementia who presents to the emergency department with EMS from [facility] after she had an unwitnessed fall.  Patient admits to drinking wine tonight.  She does not remember falling but nurse states that they heard a thud and found her unconscious on the ground.  She is not on blood thinners.  She denies any complaints of pain and is requesting to go home.  Initial blood pressure was 90/55 with EMS.  She was not able to answer orientation questions for EMS but appears to be less confused currently.   History provided by patient, EMS.    Past Medical History:  Diagnosis Date   Breast cancer ([place])    Complication of anesthesia    Hyperlipidemia    Hypertension    Hypothyroidism    Personal history of radiation therapy    PONV (postoperative nausea and vomiting)     Past Surgical History:  Procedure Laterality Date   BACK SURGERY     lumbar ruptured disc   BREAST BIOPSY Left 9/5/20019   u/s core, Invasive mammary carcinoma W/ Mucous features   BREAST LUMPECTOMY Left 12/09/2017   NL with SN   FOOT SURGERY Bilateral    x 3 bunionectomy   PARTIAL MASTECTOMY WITH NEEDLE LOCALIZATION AND AXILLARY SENTINEL LYMPH NODE BX Left 12/09/2017   Procedure: PARTIAL MASTECTOMY WITH NEEDLE LOCALIZATION AND AXILLARY SENTINEL LYMPH NODE BX;  Surgeon: Benjamine Sprague, DO;  Location: [facility] ORS;  Service: General;  Laterality: Left;    MEDICATIONS:  Prior to Admission medications   Medication Sig Start Date End Date Taking? Authorizing Provider  atorvastatin (LIPITOR) 80 MG tablet Take 80 mg by mouth daily. 04/07/14   [provider]  busPIRone (BUSPAR) 7.5 MG tablet Take 7.5 mg by mouth 2 (two) times daily. 01/15/21   [provider]  Cholecalciferol (VITAMIN D3) 1000 units CAPS Take 1,000 Units by mouth 2 (two) times daily.    [provider]  citalopram (CELEXA) 20 MG tablet Take 20 mg by mouth daily. 12/16/14 07/31/18  [provider]  donepezil (ARICEPT) 5 MG tablet Take 5 mg by mouth at bedtime. 12/18/20   [provider]  folic acid (FOLVITE) 1 MG tablet Take 1 tablet (1 mg total) by mouth daily. 06/12/21   Bonnielee Haff, MD  ibuprofen (ADVIL,MOTRIN) 800 MG tablet Take 1 tablet (800 mg total) by mouth every 8 (eight) hours as needed for mild pain or moderate pain. 12/09/17   Benjamine Sprague, DO  levothyroxine (SYNTHROID, LEVOTHROID) 50 MCG tablet Take 50 mcg by mouth daily before breakfast. 02/11/17 07/31/18  [provider]  losartan (COZAAR) 100 MG tablet Take 1 tablet (100 mg total) by mouth daily. 06/12/21   Bonnielee Haff, MD  Multiple Vitamin (MULTIVITAMIN WITH MINERALS) TABS tablet Take 1 tablet by mouth daily.    [provider]  tamoxifen (NOLVADEX) 20 MG tablet TAKE 1 TABLET BY MOUTH DAILY 02/15/19   Sindy Guadeloupe, MD  thiamine 100 MG tablet Take 1 tablet (100 mg total) by mouth daily. 06/12/21   Bonnielee Haff, MD  vitamin B-12 1000 MCG tablet Take 1 tablet (1,000 mcg total) by mouth daily. 06/14/21   Bonnielee Haff, MD    Physical  Exam   Triage Vital Signs: ED Triage Vitals  Enc Vitals Group     BP 06/08/22 0340 (!) 146/76     Pulse Rate 06/08/22 0340 100     Resp 06/08/22 0340 18     Temp 06/08/22 0340 97.9 F (36.6 C)     Temp Source 06/08/22 0340 Oral     SpO2 06/08/22 0340 95 %     Weight 06/08/22 0340 155 lb 10.3 oz (70.6 kg)     Height 06/08/22 0340 5\' 6"  (1.676 m)     Head Circumference --      Peak Flow --      Pain Score 06/08/22 0341 0     Pain Loc --      Pain Edu? --      Excl. in [place]? --     Most recent vital signs: Vitals:   06/08/22 0340  BP: (!) 146/76  Pulse: 100  Resp: 18  Temp: 97.9 F (36.6 C)  SpO2: 95%      CONSTITUTIONAL: Alert, responds appropriately to questions. Well-appearing; well-nourished; GCS 35, appears younger than stated age, oriented to person and place but states the year is 2023, slightly slurred speech, appears intoxicated HEAD: Normocephalic; atraumatic EYES: Conjunctivae clear, PERRL, EOMI ENT: normal nose; no rhinorrhea; moist mucous membranes; pharynx without lesions noted; no dental injury; no septal hematoma, no epistaxis; no facial deformity or bony tenderness NECK: Supple, no midline spinal tenderness, step-off or deformity; trachea midline CARD: RRR; S1 and S2 appreciated; no murmurs, no clicks, no rubs, no gallops RESP: Normal chest excursion without splinting or tachypnea; breath sounds clear and equal bilaterally; no wheezes, no rhonchi, no rales; no hypoxia or respiratory distress CHEST:  chest wall stable, no crepitus or ecchymosis or deformity, nontender to palpation; no flail chest ABD/GI: Non-distended; soft, non-tender, no rebound, no guarding; no ecchymosis or other lesions noted PELVIS:  stable, nontender to palpation BACK:  The back appears normal; no midline spinal tenderness, step-off or deformity EXT: Normal ROM in all joints; no edema; normal capillary refill; no cyanosis, no bony tenderness or bony deformity of patient's extremities, no joint effusions, compartments are soft, extremities are warm and well-perfused, no ecchymosis SKIN: Normal color for age and race; warm NEURO: No facial asymmetry, slightly slurred speech, moving all extremities equally, normal sensation  ED Results / Procedures / Treatments   LABS: (all labs ordered are listed, but only abnormal results are displayed) Labs Reviewed  CBC WITH DIFFERENTIAL/PLATELET - Abnormal; Notable for the following components:      Result Value   Platelets 482 (*)    Lymphs Abs 4.1 (*)    All other components within normal limits  COMPREHENSIVE METABOLIC PANEL - Abnormal; Notable for the  following components:   Glucose, Bld 111 (*)    All other components within normal limits  ETHANOL - Abnormal; Notable for the following components:   Alcohol, Ethyl (B) 237 (*)    All other components within normal limits  URINALYSIS, ROUTINE W REFLEX MICROSCOPIC - Abnormal; Notable for the following components:   Color, Urine COLORLESS (*)    APPearance CLEAR (*)    Specific Gravity, Urine 1.004 (*)    Hgb urine dipstick SMALL (*)    Bacteria, UA RARE (*)    All other components within normal limits  URINE DRUG SCREEN, QUALITATIVE [facility] ONLY)     EKG:  EKG Interpretation  Date/Time:  Saturday June 08 2022 03:47:04 EDT Ventricular Rate:  95 PR Interval:  155 QRS Duration: 141 QT Interval:  390 QTC Calculation: 491 R Axis:   -64 Text Interpretation: Sinus rhythm RBBB and LAFB No significant change since last tracing Confirmed by Pryor Curia [phone]) on 06/08/2022 4:01:47 AM          RADIOLOGY: My personal review and interpretation of imaging: MRI shows no acute findings in the cervical spine.  CT head unremarkable.  I have personally reviewed all radiology reports. MR Cervical Spine Wo Contrast  Result Date: 06/08/2022 CLINICAL DATA:  Evaluate C2 fracture EXAM: MRI CERVICAL SPINE WITHOUT CONTRAST TECHNIQUE: Multiplanar, multisequence MR imaging of the cervical spine was performed. No intravenous contrast was administered. COMPARISON:  Preceding cervical spine CT. FINDINGS: Alignment: Mild degenerative anterolisthesis at C3-4 which measures 2 mm. Mild anterior displacement at C2 fracture described below Vertebrae: Horizontal fracture plane through the C2 body and left lateral mass. Some T2 hyperintensity is seen along the fracture plane without adjacent edema. No prevertebral edema. Cord: Normal signal and morphology. Posterior Fossa, vertebral arteries, paraspinal tissues: No swelling noted. Disc levels: C2-3: Mild degenerative facet spurring C3-4: Degenerative facet  spurring with anterolisthesis. The disc is narrowed and bulging with ventral cord contact but no compression. Moderate bilateral foraminal narrowing C4-5: Disc narrowing and bulging with endplate and uncovertebral ridging eccentric to the left where there is moderate foraminal narrowing. C5-6: Disc narrowing and ridging with endplate degeneration. No neural impingement C6-7: Mild disc height loss and endplate degeneration [id number] height loss and mild endplate degeneration. IMPRESSION: 1. The previously described C2 fracture is nonacute and largely healed. 2. Ordinary cervical spine degeneration as described. Electronically Signed   By: Jorje Guild M.D.   On: 06/08/2022 05:14   CT HEAD WO CONTRAST (5MM)  Result Date: 06/08/2022 CLINICAL DATA:  Intoxication.  Head trauma. EXAM: CT HEAD WITHOUT CONTRAST CT CERVICAL SPINE WITHOUT CONTRAST TECHNIQUE: Multidetector CT imaging of the head and cervical spine was performed following the standard protocol without intravenous contrast. Multiplanar CT image reconstructions of the cervical spine were also generated. RADIATION DOSE REDUCTION: This exam was performed according to the departmental dose-optimization program which includes automated exposure control, adjustment of the mA and/or kV according to patient size and/or use of iterative reconstruction technique. COMPARISON:  Brain MRI 06/11/2021 FINDINGS: CT HEAD FINDINGS Brain: No evidence of swelling, infarction, hemorrhage, hydrocephalus, extra-axial collection or mass lesion/mass effect. Cerebral volume loss in keeping with age Vascular: No hyperdense vessel or unexpected calcification. Skull: Normal. Negative for fracture or focal lesion. Sinuses/Orbits: Dependent bilateral maxillary sinus opacification without indication of hemosinus. CT CERVICAL SPINE FINDINGS Alignment: Straightening of cervical lordosis Skull base and vertebrae: Fracture deformity across the base of dens extending into the C1 lateral  mass greater on the left where lucency is seen at the superior facet. There is sclerosis and indistinct fracture planes as seen with non acute injury. No additional fracture deformity. No displacement Soft tissues and spinal canal: Retropharyngeal course of the bilateral ICA. No definite prevertebral edema at the level of fracture but limited by streak artifact from the teeth. Disc levels: Generalized degenerative disc narrowing and ridging. Notable foraminal narrowing at C3-4. Upper chest: No visible injury These results were called by telephone at the time of interpretation on 06/08/2022 at 4:14 am to provider [facility] , who verbally acknowledged these results. IMPRESSION: 1. C2 fracture at the base of dens and left lateral mass. There is sclerosis and fracture planes are indistinct suggesting nonacute timing, although injury is new from 06/11/2021 brain MRI. 2. No  evidence of intracranial injury. Electronically Signed   By: Jorje Guild M.D.   On: 06/08/2022 04:15   CT Cervical Spine Wo Contrast  Result Date: 06/08/2022 CLINICAL DATA:  Intoxication.  Head trauma. EXAM: CT HEAD WITHOUT CONTRAST CT CERVICAL SPINE WITHOUT CONTRAST TECHNIQUE: Multidetector CT imaging of the head and cervical spine was performed following the standard protocol without intravenous contrast. Multiplanar CT image reconstructions of the cervical spine were also generated. RADIATION DOSE REDUCTION: This exam was performed according to the departmental dose-optimization program which includes automated exposure control, adjustment of the mA and/or kV according to patient size and/or use of iterative reconstruction technique. COMPARISON:  Brain MRI 06/11/2021 FINDINGS: CT HEAD FINDINGS Brain: No evidence of swelling, infarction, hemorrhage, hydrocephalus, extra-axial collection or mass lesion/mass effect. Cerebral volume loss in keeping with age Vascular: No hyperdense vessel or unexpected calcification. Skull: Normal. Negative for  fracture or focal lesion. Sinuses/Orbits: Dependent bilateral maxillary sinus opacification without indication of hemosinus. CT CERVICAL SPINE FINDINGS Alignment: Straightening of cervical lordosis Skull base and vertebrae: Fracture deformity across the base of dens extending into the C1 lateral mass greater on the left where lucency is seen at the superior facet. There is sclerosis and indistinct fracture planes as seen with non acute injury. No additional fracture deformity. No displacement Soft tissues and spinal canal: Retropharyngeal course of the bilateral ICA. No definite prevertebral edema at the level of fracture but limited by streak artifact from the teeth. Disc levels: Generalized degenerative disc narrowing and ridging. Notable foraminal narrowing at C3-4. Upper chest: No visible injury These results were called by telephone at the time of interpretation on 06/08/2022 at 4:14 am to provider [facility] , who verbally acknowledged these results. IMPRESSION: 1. C2 fracture at the base of dens and left lateral mass. There is sclerosis and fracture planes are indistinct suggesting nonacute timing, although injury is new from 06/11/2021 brain MRI. 2. No evidence of intracranial injury. Electronically Signed   By: Jorje Guild M.D.   On: 06/08/2022 04:15     PROCEDURES:  Critical Care performed: No     .1-3 Lead EKG Interpretation  Performed by: Anaia Frith, Delice Bison, DO Authorized by: Linnie Delgrande, Delice Bison, DO     Interpretation: abnormal     ECG rate:  100   ECG rate assessment: tachycardic     Rhythm: sinus tachycardia     Ectopy: none     Conduction: normal       IMPRESSION / MDM / ASSESSMENT AND PLAN / ED COURSE  I reviewed the triage vital signs and the nursing notes.  Patient here after an unwitnessed fall.  She is intoxicated and admits to drinking wine.  The patient is on the cardiac monitor to evaluate for evidence of arrhythmia and/or significant heart rate  changes.   DIFFERENTIAL DIAGNOSIS (includes but not limited to):   Alcohol intoxication, possible head injury, concussion, intracranial hemorrhage, skull fracture, cervical spine fracture  Patient's presentation is most consistent with acute presentation with potential threat to life or bodily function.  PLAN: Will obtain CT head and cervical spine, basic blood work, urinalysis.  Will give IV fluids.  Will check blood sugar.   MEDICATIONS GIVEN IN ED: Medications  sodium chloride 0.9 % bolus 1,000 mL (0 mLs Intravenous Stopped 06/08/22 0532)     ED COURSE: Received a call from Dr. Pascal Lux with radiology that patient appeared to have a possibly subacute injury to C2.  Unable to clinically correlate given patient's dementia and intoxication  with no previous records that state that she has had a previous C-spine fracture.  He recommends obtaining MRI without contrast.  6:32 AM  Alcohol level here of 237.  Patient able to ambulate with one-person assist.  She has a walker for use at home.  Complaining of right knee pain but states this is chronic.  Denies any other pain.  Normal hemoglobin, electrolytes, glucose.  Urine shows no sign of infection.  Drug screen negative.  Patient repeatedly asking to go home.  I feel at this time she is stable and she will wait for EMS to pick her up.  Recommended that she use her walker for the next few days, take Tylenol as needed.  Discussed return precautions.  Patient is alert and oriented x 3.   At this time, I do not feel there is any life-threatening condition present. I reviewed all nursing notes, vitals, pertinent previous records.  All lab and urine results, EKGs, imaging ordered have been independently reviewed and interpreted by myself.  I reviewed all available radiology reports from any imaging ordered this visit.  Based on my assessment, I feel the patient is safe to be discharged home without further emergent workup and can continue workup as an  outpatient as needed. Discussed all findings, treatment plan as well as usual and customary return precautions.  They verbalize understanding and are comfortable with this plan.  Outpatient follow-up has been provided as needed.  All questions have been answered.    CONSULTS:  none   OUTSIDE RECORDS REVIEWED: Reviewed last PCP note in September 2023.       FINAL CLINICAL IMPRESSION(S) / ED DIAGNOSES   Final diagnoses:  Unwitnessed fall  Alcoholic intoxication with complication ([place])     Rx / DC Orders   ED Discharge Orders     None        Note:  This document was prepared using Dragon voice recognition software and may include unintentional dictation errors.   Anthoni Geerts, Delice Bison, DO 06/08/22 [phone]

## 2022-06-08 NOTE — ED Notes (Signed)
Spoke with Christy Braun and spoke with [facility] staff. Per Dewaine Oats, daughter Lattie Haw is pt's POA. This RN called Dudley Major and updated her of pt's status and that pt wants to leave with friend Pricilla Handler. Daughter states ok for pt to leave with friend.

## 2022-06-08 NOTE — ED Triage Notes (Signed)
Pt presents from [facility] via [facility] for a fall following ETOH consumption. Pt has no complaints. Per EMS, the patient was found on the ground by staff but no obvious injury present. A&Ox3 at this time. Denies CP or SOB.

## 2022-06-08 NOTE — ED Notes (Signed)
Pt report given to Criss Alvine, Med Tech @ [facility] for pt transport back to the facility. Transport has  been arranged at this time, the pt has been educated on her discharge and follow-up and is agreeable with the plan at this time. Close monitoring continued.

## 2022-06-13 ENCOUNTER — Telehealth: Payer: Self-pay

## 2022-06-13 DIAGNOSIS — M62838 Other muscle spasm: Secondary | ICD-10-CM | POA: Diagnosis not present

## 2022-06-13 DIAGNOSIS — E039 Hypothyroidism, unspecified: Secondary | ICD-10-CM | POA: Diagnosis not present

## 2022-06-13 DIAGNOSIS — M1990 Unspecified osteoarthritis, unspecified site: Secondary | ICD-10-CM | POA: Diagnosis not present

## 2022-06-13 DIAGNOSIS — R262 Difficulty in walking, not elsewhere classified: Secondary | ICD-10-CM | POA: Diagnosis not present

## 2022-06-13 DIAGNOSIS — K219 Gastro-esophageal reflux disease without esophagitis: Secondary | ICD-10-CM | POA: Diagnosis not present

## 2022-06-13 DIAGNOSIS — E559 Vitamin D deficiency, unspecified: Secondary | ICD-10-CM | POA: Diagnosis not present

## 2022-06-13 DIAGNOSIS — E785 Hyperlipidemia, unspecified: Secondary | ICD-10-CM | POA: Diagnosis not present

## 2022-06-13 DIAGNOSIS — R278 Other lack of coordination: Secondary | ICD-10-CM | POA: Diagnosis not present

## 2022-06-13 DIAGNOSIS — F0390 Unspecified dementia without behavioral disturbance: Secondary | ICD-10-CM | POA: Diagnosis not present

## 2022-06-13 DIAGNOSIS — I10 Essential (primary) hypertension: Secondary | ICD-10-CM | POA: Diagnosis not present

## 2022-06-13 NOTE — Telephone Encounter (Signed)
        Patient  visited [place] on 3/16   Telephone encounter attempt :  1st  A HIPAA compliant voice message was left requesting a return call.  Instructed patient to call back    [facility] [phone] [address], [place], [place] [address] Phone: [phone] [email or web address]

## 2022-06-17 DIAGNOSIS — I10 Essential (primary) hypertension: Secondary | ICD-10-CM | POA: Diagnosis not present

## 2022-06-17 DIAGNOSIS — E782 Mixed hyperlipidemia: Secondary | ICD-10-CM | POA: Diagnosis not present

## 2022-06-17 DIAGNOSIS — E119 Type 2 diabetes mellitus without complications: Secondary | ICD-10-CM | POA: Diagnosis not present

## 2022-06-17 DIAGNOSIS — E7849 Other hyperlipidemia: Secondary | ICD-10-CM | POA: Diagnosis not present

## 2022-06-17 DIAGNOSIS — E559 Vitamin D deficiency, unspecified: Secondary | ICD-10-CM | POA: Diagnosis not present

## 2022-06-17 DIAGNOSIS — E039 Hypothyroidism, unspecified: Secondary | ICD-10-CM | POA: Diagnosis not present

## 2022-06-17 DIAGNOSIS — D518 Other vitamin B12 deficiency anemias: Secondary | ICD-10-CM | POA: Diagnosis not present

## 2022-06-17 DIAGNOSIS — E785 Hyperlipidemia, unspecified: Secondary | ICD-10-CM | POA: Diagnosis not present

## 2022-06-17 DIAGNOSIS — F419 Anxiety disorder, unspecified: Secondary | ICD-10-CM | POA: Diagnosis not present

## 2022-06-17 DIAGNOSIS — F0390 Unspecified dementia without behavioral disturbance: Secondary | ICD-10-CM | POA: Diagnosis not present

## 2022-06-17 DIAGNOSIS — E038 Other specified hypothyroidism: Secondary | ICD-10-CM | POA: Diagnosis not present

## 2022-07-11 DIAGNOSIS — K219 Gastro-esophageal reflux disease without esophagitis: Secondary | ICD-10-CM | POA: Diagnosis not present

## 2022-07-11 DIAGNOSIS — E559 Vitamin D deficiency, unspecified: Secondary | ICD-10-CM | POA: Diagnosis not present

## 2022-07-11 DIAGNOSIS — I10 Essential (primary) hypertension: Secondary | ICD-10-CM | POA: Diagnosis not present

## 2022-07-11 DIAGNOSIS — F0390 Unspecified dementia without behavioral disturbance: Secondary | ICD-10-CM | POA: Diagnosis not present

## 2022-07-11 DIAGNOSIS — R278 Other lack of coordination: Secondary | ICD-10-CM | POA: Diagnosis not present

## 2022-07-11 DIAGNOSIS — M1990 Unspecified osteoarthritis, unspecified site: Secondary | ICD-10-CM | POA: Diagnosis not present

## 2022-07-11 DIAGNOSIS — M62838 Other muscle spasm: Secondary | ICD-10-CM | POA: Diagnosis not present

## 2022-07-11 DIAGNOSIS — R262 Difficulty in walking, not elsewhere classified: Secondary | ICD-10-CM | POA: Diagnosis not present

## 2022-07-11 DIAGNOSIS — R296 Repeated falls: Secondary | ICD-10-CM | POA: Diagnosis not present

## 2022-07-11 DIAGNOSIS — E039 Hypothyroidism, unspecified: Secondary | ICD-10-CM | POA: Diagnosis not present

## 2022-07-11 DIAGNOSIS — M2569 Stiffness of other specified joint, not elsewhere classified: Secondary | ICD-10-CM | POA: Diagnosis not present

## 2022-07-11 DIAGNOSIS — E785 Hyperlipidemia, unspecified: Secondary | ICD-10-CM | POA: Diagnosis not present

## 2022-07-16 DIAGNOSIS — M2569 Stiffness of other specified joint, not elsewhere classified: Secondary | ICD-10-CM | POA: Diagnosis not present

## 2022-07-16 DIAGNOSIS — R262 Difficulty in walking, not elsewhere classified: Secondary | ICD-10-CM | POA: Diagnosis not present

## 2022-07-16 DIAGNOSIS — R278 Other lack of coordination: Secondary | ICD-10-CM | POA: Diagnosis not present

## 2022-07-16 DIAGNOSIS — R296 Repeated falls: Secondary | ICD-10-CM | POA: Diagnosis not present

## 2022-07-17 DIAGNOSIS — E785 Hyperlipidemia, unspecified: Secondary | ICD-10-CM | POA: Diagnosis not present

## 2022-07-17 DIAGNOSIS — E039 Hypothyroidism, unspecified: Secondary | ICD-10-CM | POA: Diagnosis not present

## 2022-07-17 DIAGNOSIS — F419 Anxiety disorder, unspecified: Secondary | ICD-10-CM | POA: Diagnosis not present

## 2022-07-17 DIAGNOSIS — D518 Other vitamin B12 deficiency anemias: Secondary | ICD-10-CM | POA: Diagnosis not present

## 2022-07-17 DIAGNOSIS — I10 Essential (primary) hypertension: Secondary | ICD-10-CM | POA: Diagnosis not present

## 2022-07-17 DIAGNOSIS — E119 Type 2 diabetes mellitus without complications: Secondary | ICD-10-CM | POA: Diagnosis not present

## 2022-07-17 DIAGNOSIS — F0390 Unspecified dementia without behavioral disturbance: Secondary | ICD-10-CM | POA: Diagnosis not present

## 2022-07-17 DIAGNOSIS — E782 Mixed hyperlipidemia: Secondary | ICD-10-CM | POA: Diagnosis not present

## 2022-07-17 DIAGNOSIS — E559 Vitamin D deficiency, unspecified: Secondary | ICD-10-CM | POA: Diagnosis not present

## 2022-07-17 DIAGNOSIS — E7849 Other hyperlipidemia: Secondary | ICD-10-CM | POA: Diagnosis not present

## 2022-07-17 DIAGNOSIS — E038 Other specified hypothyroidism: Secondary | ICD-10-CM | POA: Diagnosis not present

## 2022-07-18 DIAGNOSIS — R278 Other lack of coordination: Secondary | ICD-10-CM | POA: Diagnosis not present

## 2022-07-18 DIAGNOSIS — M2569 Stiffness of other specified joint, not elsewhere classified: Secondary | ICD-10-CM | POA: Diagnosis not present

## 2022-07-18 DIAGNOSIS — R262 Difficulty in walking, not elsewhere classified: Secondary | ICD-10-CM | POA: Diagnosis not present

## 2022-07-18 DIAGNOSIS — R296 Repeated falls: Secondary | ICD-10-CM | POA: Diagnosis not present

## 2022-07-19 DIAGNOSIS — I10 Essential (primary) hypertension: Secondary | ICD-10-CM | POA: Diagnosis not present

## 2022-07-22 DIAGNOSIS — R262 Difficulty in walking, not elsewhere classified: Secondary | ICD-10-CM | POA: Diagnosis not present

## 2022-07-22 DIAGNOSIS — M2569 Stiffness of other specified joint, not elsewhere classified: Secondary | ICD-10-CM | POA: Diagnosis not present

## 2022-07-22 DIAGNOSIS — R296 Repeated falls: Secondary | ICD-10-CM | POA: Diagnosis not present

## 2022-07-22 DIAGNOSIS — R278 Other lack of coordination: Secondary | ICD-10-CM | POA: Diagnosis not present

## 2022-07-24 DIAGNOSIS — R262 Difficulty in walking, not elsewhere classified: Secondary | ICD-10-CM | POA: Diagnosis not present

## 2022-07-24 DIAGNOSIS — R278 Other lack of coordination: Secondary | ICD-10-CM | POA: Diagnosis not present

## 2022-07-24 DIAGNOSIS — M2569 Stiffness of other specified joint, not elsewhere classified: Secondary | ICD-10-CM | POA: Diagnosis not present

## 2022-07-24 DIAGNOSIS — R296 Repeated falls: Secondary | ICD-10-CM | POA: Diagnosis not present

## 2022-07-30 DIAGNOSIS — R278 Other lack of coordination: Secondary | ICD-10-CM | POA: Diagnosis not present

## 2022-07-30 DIAGNOSIS — M2569 Stiffness of other specified joint, not elsewhere classified: Secondary | ICD-10-CM | POA: Diagnosis not present

## 2022-07-30 DIAGNOSIS — R262 Difficulty in walking, not elsewhere classified: Secondary | ICD-10-CM | POA: Diagnosis not present

## 2022-07-30 DIAGNOSIS — R296 Repeated falls: Secondary | ICD-10-CM | POA: Diagnosis not present

## 2022-08-01 DIAGNOSIS — R262 Difficulty in walking, not elsewhere classified: Secondary | ICD-10-CM | POA: Diagnosis not present

## 2022-08-01 DIAGNOSIS — R278 Other lack of coordination: Secondary | ICD-10-CM | POA: Diagnosis not present

## 2022-08-06 DIAGNOSIS — M2569 Stiffness of other specified joint, not elsewhere classified: Secondary | ICD-10-CM | POA: Diagnosis not present

## 2022-08-06 DIAGNOSIS — R262 Difficulty in walking, not elsewhere classified: Secondary | ICD-10-CM | POA: Diagnosis not present

## 2022-08-06 DIAGNOSIS — R296 Repeated falls: Secondary | ICD-10-CM | POA: Diagnosis not present

## 2022-08-06 DIAGNOSIS — R278 Other lack of coordination: Secondary | ICD-10-CM | POA: Diagnosis not present

## 2022-08-08 DIAGNOSIS — R262 Difficulty in walking, not elsewhere classified: Secondary | ICD-10-CM | POA: Diagnosis not present

## 2022-08-08 DIAGNOSIS — R296 Repeated falls: Secondary | ICD-10-CM | POA: Diagnosis not present

## 2022-08-08 DIAGNOSIS — M2569 Stiffness of other specified joint, not elsewhere classified: Secondary | ICD-10-CM | POA: Diagnosis not present

## 2022-08-08 DIAGNOSIS — R278 Other lack of coordination: Secondary | ICD-10-CM | POA: Diagnosis not present

## 2022-08-12 DIAGNOSIS — E038 Other specified hypothyroidism: Secondary | ICD-10-CM | POA: Diagnosis not present

## 2022-08-12 DIAGNOSIS — R262 Difficulty in walking, not elsewhere classified: Secondary | ICD-10-CM | POA: Diagnosis not present

## 2022-08-12 DIAGNOSIS — D518 Other vitamin B12 deficiency anemias: Secondary | ICD-10-CM | POA: Diagnosis not present

## 2022-08-12 DIAGNOSIS — M2569 Stiffness of other specified joint, not elsewhere classified: Secondary | ICD-10-CM | POA: Diagnosis not present

## 2022-08-12 DIAGNOSIS — R278 Other lack of coordination: Secondary | ICD-10-CM | POA: Diagnosis not present

## 2022-08-12 DIAGNOSIS — R296 Repeated falls: Secondary | ICD-10-CM | POA: Diagnosis not present

## 2022-08-12 DIAGNOSIS — Z79899 Other long term (current) drug therapy: Secondary | ICD-10-CM | POA: Diagnosis not present

## 2022-08-12 DIAGNOSIS — E119 Type 2 diabetes mellitus without complications: Secondary | ICD-10-CM | POA: Diagnosis not present

## 2022-08-12 DIAGNOSIS — E782 Mixed hyperlipidemia: Secondary | ICD-10-CM | POA: Diagnosis not present

## 2022-08-14 DIAGNOSIS — R278 Other lack of coordination: Secondary | ICD-10-CM | POA: Diagnosis not present

## 2022-08-14 DIAGNOSIS — R262 Difficulty in walking, not elsewhere classified: Secondary | ICD-10-CM | POA: Diagnosis not present

## 2022-08-15 DIAGNOSIS — K219 Gastro-esophageal reflux disease without esophagitis: Secondary | ICD-10-CM | POA: Diagnosis not present

## 2022-08-15 DIAGNOSIS — M1990 Unspecified osteoarthritis, unspecified site: Secondary | ICD-10-CM | POA: Diagnosis not present

## 2022-08-15 DIAGNOSIS — F0390 Unspecified dementia without behavioral disturbance: Secondary | ICD-10-CM | POA: Diagnosis not present

## 2022-08-15 DIAGNOSIS — M62838 Other muscle spasm: Secondary | ICD-10-CM | POA: Diagnosis not present

## 2022-08-15 DIAGNOSIS — F419 Anxiety disorder, unspecified: Secondary | ICD-10-CM | POA: Diagnosis not present

## 2022-08-15 DIAGNOSIS — E785 Hyperlipidemia, unspecified: Secondary | ICD-10-CM | POA: Diagnosis not present

## 2022-08-15 DIAGNOSIS — E876 Hypokalemia: Secondary | ICD-10-CM | POA: Diagnosis not present

## 2022-08-15 DIAGNOSIS — R7303 Prediabetes: Secondary | ICD-10-CM | POA: Diagnosis not present

## 2022-08-15 DIAGNOSIS — I10 Essential (primary) hypertension: Secondary | ICD-10-CM | POA: Diagnosis not present

## 2022-08-15 DIAGNOSIS — E559 Vitamin D deficiency, unspecified: Secondary | ICD-10-CM | POA: Diagnosis not present

## 2022-08-16 DIAGNOSIS — E7849 Other hyperlipidemia: Secondary | ICD-10-CM | POA: Diagnosis not present

## 2022-08-16 DIAGNOSIS — E785 Hyperlipidemia, unspecified: Secondary | ICD-10-CM | POA: Diagnosis not present

## 2022-08-16 DIAGNOSIS — D518 Other vitamin B12 deficiency anemias: Secondary | ICD-10-CM | POA: Diagnosis not present

## 2022-08-16 DIAGNOSIS — E782 Mixed hyperlipidemia: Secondary | ICD-10-CM | POA: Diagnosis not present

## 2022-08-16 DIAGNOSIS — I10 Essential (primary) hypertension: Secondary | ICD-10-CM | POA: Diagnosis not present

## 2022-08-16 DIAGNOSIS — E119 Type 2 diabetes mellitus without complications: Secondary | ICD-10-CM | POA: Diagnosis not present

## 2022-08-16 DIAGNOSIS — E559 Vitamin D deficiency, unspecified: Secondary | ICD-10-CM | POA: Diagnosis not present

## 2022-08-16 DIAGNOSIS — F419 Anxiety disorder, unspecified: Secondary | ICD-10-CM | POA: Diagnosis not present

## 2022-08-16 DIAGNOSIS — F0390 Unspecified dementia without behavioral disturbance: Secondary | ICD-10-CM | POA: Diagnosis not present

## 2022-08-16 DIAGNOSIS — E038 Other specified hypothyroidism: Secondary | ICD-10-CM | POA: Diagnosis not present

## 2022-08-16 DIAGNOSIS — E039 Hypothyroidism, unspecified: Secondary | ICD-10-CM | POA: Diagnosis not present

## 2022-08-18 DIAGNOSIS — I10 Essential (primary) hypertension: Secondary | ICD-10-CM | POA: Diagnosis not present

## 2022-08-22 DIAGNOSIS — R278 Other lack of coordination: Secondary | ICD-10-CM | POA: Diagnosis not present

## 2022-08-22 DIAGNOSIS — M2569 Stiffness of other specified joint, not elsewhere classified: Secondary | ICD-10-CM | POA: Diagnosis not present

## 2022-08-22 DIAGNOSIS — R296 Repeated falls: Secondary | ICD-10-CM | POA: Diagnosis not present

## 2022-08-22 DIAGNOSIS — R262 Difficulty in walking, not elsewhere classified: Secondary | ICD-10-CM | POA: Diagnosis not present

## 2022-08-23 DIAGNOSIS — M2569 Stiffness of other specified joint, not elsewhere classified: Secondary | ICD-10-CM | POA: Diagnosis not present

## 2022-08-23 DIAGNOSIS — R296 Repeated falls: Secondary | ICD-10-CM | POA: Diagnosis not present

## 2022-08-23 DIAGNOSIS — R262 Difficulty in walking, not elsewhere classified: Secondary | ICD-10-CM | POA: Diagnosis not present

## 2022-08-23 DIAGNOSIS — R278 Other lack of coordination: Secondary | ICD-10-CM | POA: Diagnosis not present

## 2022-08-26 DIAGNOSIS — R262 Difficulty in walking, not elsewhere classified: Secondary | ICD-10-CM | POA: Diagnosis not present

## 2022-08-26 DIAGNOSIS — R278 Other lack of coordination: Secondary | ICD-10-CM | POA: Diagnosis not present

## 2022-08-29 DIAGNOSIS — R262 Difficulty in walking, not elsewhere classified: Secondary | ICD-10-CM | POA: Diagnosis not present

## 2022-08-29 DIAGNOSIS — R278 Other lack of coordination: Secondary | ICD-10-CM | POA: Diagnosis not present

## 2022-08-29 DIAGNOSIS — R296 Repeated falls: Secondary | ICD-10-CM | POA: Diagnosis not present

## 2022-08-29 DIAGNOSIS — M2569 Stiffness of other specified joint, not elsewhere classified: Secondary | ICD-10-CM | POA: Diagnosis not present

## 2022-09-02 DIAGNOSIS — M2569 Stiffness of other specified joint, not elsewhere classified: Secondary | ICD-10-CM | POA: Diagnosis not present

## 2022-09-02 DIAGNOSIS — R278 Other lack of coordination: Secondary | ICD-10-CM | POA: Diagnosis not present

## 2022-09-02 DIAGNOSIS — R262 Difficulty in walking, not elsewhere classified: Secondary | ICD-10-CM | POA: Diagnosis not present

## 2022-09-02 DIAGNOSIS — R296 Repeated falls: Secondary | ICD-10-CM | POA: Diagnosis not present

## 2022-09-03 DIAGNOSIS — R296 Repeated falls: Secondary | ICD-10-CM | POA: Diagnosis not present

## 2022-09-03 DIAGNOSIS — M2569 Stiffness of other specified joint, not elsewhere classified: Secondary | ICD-10-CM | POA: Diagnosis not present

## 2022-09-03 DIAGNOSIS — R278 Other lack of coordination: Secondary | ICD-10-CM | POA: Diagnosis not present

## 2022-09-03 DIAGNOSIS — R262 Difficulty in walking, not elsewhere classified: Secondary | ICD-10-CM | POA: Diagnosis not present

## 2022-09-10 DIAGNOSIS — R278 Other lack of coordination: Secondary | ICD-10-CM | POA: Diagnosis not present

## 2022-09-10 DIAGNOSIS — M2569 Stiffness of other specified joint, not elsewhere classified: Secondary | ICD-10-CM | POA: Diagnosis not present

## 2022-09-10 DIAGNOSIS — R296 Repeated falls: Secondary | ICD-10-CM | POA: Diagnosis not present

## 2022-09-10 DIAGNOSIS — R262 Difficulty in walking, not elsewhere classified: Secondary | ICD-10-CM | POA: Diagnosis not present

## 2022-09-12 DIAGNOSIS — R296 Repeated falls: Secondary | ICD-10-CM | POA: Diagnosis not present

## 2022-09-12 DIAGNOSIS — M2569 Stiffness of other specified joint, not elsewhere classified: Secondary | ICD-10-CM | POA: Diagnosis not present

## 2022-09-12 DIAGNOSIS — R278 Other lack of coordination: Secondary | ICD-10-CM | POA: Diagnosis not present

## 2022-09-12 DIAGNOSIS — R262 Difficulty in walking, not elsewhere classified: Secondary | ICD-10-CM | POA: Diagnosis not present

## 2022-09-17 DIAGNOSIS — R278 Other lack of coordination: Secondary | ICD-10-CM | POA: Diagnosis not present

## 2022-09-17 DIAGNOSIS — R262 Difficulty in walking, not elsewhere classified: Secondary | ICD-10-CM | POA: Diagnosis not present

## 2022-09-17 DIAGNOSIS — M2569 Stiffness of other specified joint, not elsewhere classified: Secondary | ICD-10-CM | POA: Diagnosis not present

## 2022-09-17 DIAGNOSIS — R296 Repeated falls: Secondary | ICD-10-CM | POA: Diagnosis not present

## 2022-09-18 DIAGNOSIS — I10 Essential (primary) hypertension: Secondary | ICD-10-CM | POA: Diagnosis not present

## 2022-09-19 DIAGNOSIS — E038 Other specified hypothyroidism: Secondary | ICD-10-CM | POA: Diagnosis not present

## 2022-09-19 DIAGNOSIS — E559 Vitamin D deficiency, unspecified: Secondary | ICD-10-CM | POA: Diagnosis not present

## 2022-09-19 DIAGNOSIS — F0390 Unspecified dementia without behavioral disturbance: Secondary | ICD-10-CM | POA: Diagnosis not present

## 2022-09-19 DIAGNOSIS — I10 Essential (primary) hypertension: Secondary | ICD-10-CM | POA: Diagnosis not present

## 2022-09-19 DIAGNOSIS — E039 Hypothyroidism, unspecified: Secondary | ICD-10-CM | POA: Diagnosis not present

## 2022-09-19 DIAGNOSIS — E785 Hyperlipidemia, unspecified: Secondary | ICD-10-CM | POA: Diagnosis not present

## 2022-09-19 DIAGNOSIS — E119 Type 2 diabetes mellitus without complications: Secondary | ICD-10-CM | POA: Diagnosis not present

## 2022-09-19 DIAGNOSIS — R278 Other lack of coordination: Secondary | ICD-10-CM | POA: Diagnosis not present

## 2022-09-19 DIAGNOSIS — E7849 Other hyperlipidemia: Secondary | ICD-10-CM | POA: Diagnosis not present

## 2022-09-19 DIAGNOSIS — D518 Other vitamin B12 deficiency anemias: Secondary | ICD-10-CM | POA: Diagnosis not present

## 2022-09-19 DIAGNOSIS — E782 Mixed hyperlipidemia: Secondary | ICD-10-CM | POA: Diagnosis not present

## 2022-09-19 DIAGNOSIS — F419 Anxiety disorder, unspecified: Secondary | ICD-10-CM | POA: Diagnosis not present

## 2022-09-19 DIAGNOSIS — R262 Difficulty in walking, not elsewhere classified: Secondary | ICD-10-CM | POA: Diagnosis not present

## 2022-09-24 DIAGNOSIS — R278 Other lack of coordination: Secondary | ICD-10-CM | POA: Diagnosis not present

## 2022-09-24 DIAGNOSIS — R262 Difficulty in walking, not elsewhere classified: Secondary | ICD-10-CM | POA: Diagnosis not present

## 2022-09-30 DIAGNOSIS — R262 Difficulty in walking, not elsewhere classified: Secondary | ICD-10-CM | POA: Diagnosis not present

## 2022-09-30 DIAGNOSIS — R278 Other lack of coordination: Secondary | ICD-10-CM | POA: Diagnosis not present

## 2022-10-09 DIAGNOSIS — R278 Other lack of coordination: Secondary | ICD-10-CM | POA: Diagnosis not present

## 2022-10-09 DIAGNOSIS — R262 Difficulty in walking, not elsewhere classified: Secondary | ICD-10-CM | POA: Diagnosis not present

## 2022-10-10 DIAGNOSIS — E876 Hypokalemia: Secondary | ICD-10-CM | POA: Diagnosis not present

## 2022-10-10 DIAGNOSIS — E559 Vitamin D deficiency, unspecified: Secondary | ICD-10-CM | POA: Diagnosis not present

## 2022-10-10 DIAGNOSIS — I10 Essential (primary) hypertension: Secondary | ICD-10-CM | POA: Diagnosis not present

## 2022-10-10 DIAGNOSIS — M1990 Unspecified osteoarthritis, unspecified site: Secondary | ICD-10-CM | POA: Diagnosis not present

## 2022-10-10 DIAGNOSIS — F0390 Unspecified dementia without behavioral disturbance: Secondary | ICD-10-CM | POA: Diagnosis not present

## 2022-10-10 DIAGNOSIS — E039 Hypothyroidism, unspecified: Secondary | ICD-10-CM | POA: Diagnosis not present

## 2022-10-10 DIAGNOSIS — E785 Hyperlipidemia, unspecified: Secondary | ICD-10-CM | POA: Diagnosis not present

## 2022-10-10 DIAGNOSIS — K219 Gastro-esophageal reflux disease without esophagitis: Secondary | ICD-10-CM | POA: Diagnosis not present

## 2022-10-10 DIAGNOSIS — F419 Anxiety disorder, unspecified: Secondary | ICD-10-CM | POA: Diagnosis not present

## 2022-10-10 DIAGNOSIS — M62838 Other muscle spasm: Secondary | ICD-10-CM | POA: Diagnosis not present

## 2022-10-18 DIAGNOSIS — F0390 Unspecified dementia without behavioral disturbance: Secondary | ICD-10-CM | POA: Diagnosis not present

## 2022-10-18 DIAGNOSIS — E038 Other specified hypothyroidism: Secondary | ICD-10-CM | POA: Diagnosis not present

## 2022-10-18 DIAGNOSIS — D518 Other vitamin B12 deficiency anemias: Secondary | ICD-10-CM | POA: Diagnosis not present

## 2022-10-18 DIAGNOSIS — E039 Hypothyroidism, unspecified: Secondary | ICD-10-CM | POA: Diagnosis not present

## 2022-10-18 DIAGNOSIS — E119 Type 2 diabetes mellitus without complications: Secondary | ICD-10-CM | POA: Diagnosis not present

## 2022-10-18 DIAGNOSIS — I10 Essential (primary) hypertension: Secondary | ICD-10-CM | POA: Diagnosis not present

## 2022-10-18 DIAGNOSIS — E559 Vitamin D deficiency, unspecified: Secondary | ICD-10-CM | POA: Diagnosis not present

## 2022-10-18 DIAGNOSIS — E782 Mixed hyperlipidemia: Secondary | ICD-10-CM | POA: Diagnosis not present

## 2022-10-18 DIAGNOSIS — E785 Hyperlipidemia, unspecified: Secondary | ICD-10-CM | POA: Diagnosis not present

## 2022-10-18 DIAGNOSIS — E7849 Other hyperlipidemia: Secondary | ICD-10-CM | POA: Diagnosis not present

## 2022-10-18 DIAGNOSIS — F419 Anxiety disorder, unspecified: Secondary | ICD-10-CM | POA: Diagnosis not present

## 2022-10-19 DIAGNOSIS — I10 Essential (primary) hypertension: Secondary | ICD-10-CM | POA: Diagnosis not present

## 2022-11-15 DIAGNOSIS — E785 Hyperlipidemia, unspecified: Secondary | ICD-10-CM | POA: Diagnosis not present

## 2022-11-15 DIAGNOSIS — E559 Vitamin D deficiency, unspecified: Secondary | ICD-10-CM | POA: Diagnosis not present

## 2022-11-15 DIAGNOSIS — I10 Essential (primary) hypertension: Secondary | ICD-10-CM | POA: Diagnosis not present

## 2022-11-15 DIAGNOSIS — E782 Mixed hyperlipidemia: Secondary | ICD-10-CM | POA: Diagnosis not present

## 2022-11-15 DIAGNOSIS — E7849 Other hyperlipidemia: Secondary | ICD-10-CM | POA: Diagnosis not present

## 2022-11-15 DIAGNOSIS — E038 Other specified hypothyroidism: Secondary | ICD-10-CM | POA: Diagnosis not present

## 2022-11-15 DIAGNOSIS — E039 Hypothyroidism, unspecified: Secondary | ICD-10-CM | POA: Diagnosis not present

## 2022-11-15 DIAGNOSIS — F419 Anxiety disorder, unspecified: Secondary | ICD-10-CM | POA: Diagnosis not present

## 2022-11-15 DIAGNOSIS — E119 Type 2 diabetes mellitus without complications: Secondary | ICD-10-CM | POA: Diagnosis not present

## 2022-11-15 DIAGNOSIS — D518 Other vitamin B12 deficiency anemias: Secondary | ICD-10-CM | POA: Diagnosis not present

## 2022-11-15 DIAGNOSIS — F0390 Unspecified dementia without behavioral disturbance: Secondary | ICD-10-CM | POA: Diagnosis not present

## 2022-11-19 DIAGNOSIS — I10 Essential (primary) hypertension: Secondary | ICD-10-CM | POA: Diagnosis not present

## 2022-11-21 DIAGNOSIS — R7303 Prediabetes: Secondary | ICD-10-CM | POA: Diagnosis not present

## 2022-11-21 DIAGNOSIS — I10 Essential (primary) hypertension: Secondary | ICD-10-CM | POA: Diagnosis not present

## 2022-11-21 DIAGNOSIS — E785 Hyperlipidemia, unspecified: Secondary | ICD-10-CM | POA: Diagnosis not present

## 2022-11-21 DIAGNOSIS — M62838 Other muscle spasm: Secondary | ICD-10-CM | POA: Diagnosis not present

## 2022-11-21 DIAGNOSIS — E559 Vitamin D deficiency, unspecified: Secondary | ICD-10-CM | POA: Diagnosis not present

## 2022-11-21 DIAGNOSIS — E876 Hypokalemia: Secondary | ICD-10-CM | POA: Diagnosis not present

## 2022-11-21 DIAGNOSIS — N6332 Unspecified lump in axillary tail of the left breast: Secondary | ICD-10-CM | POA: Diagnosis not present

## 2022-11-21 DIAGNOSIS — K219 Gastro-esophageal reflux disease without esophagitis: Secondary | ICD-10-CM | POA: Diagnosis not present

## 2022-11-21 DIAGNOSIS — M1990 Unspecified osteoarthritis, unspecified site: Secondary | ICD-10-CM | POA: Diagnosis not present

## 2022-11-21 DIAGNOSIS — E039 Hypothyroidism, unspecified: Secondary | ICD-10-CM | POA: Diagnosis not present

## 2022-11-22 DIAGNOSIS — M62838 Other muscle spasm: Secondary | ICD-10-CM | POA: Diagnosis not present

## 2022-11-22 DIAGNOSIS — R7303 Prediabetes: Secondary | ICD-10-CM | POA: Diagnosis not present

## 2022-11-22 DIAGNOSIS — E559 Vitamin D deficiency, unspecified: Secondary | ICD-10-CM | POA: Diagnosis not present

## 2022-11-22 DIAGNOSIS — E039 Hypothyroidism, unspecified: Secondary | ICD-10-CM | POA: Diagnosis not present

## 2022-11-22 DIAGNOSIS — K219 Gastro-esophageal reflux disease without esophagitis: Secondary | ICD-10-CM | POA: Diagnosis not present

## 2022-11-22 DIAGNOSIS — N6332 Unspecified lump in axillary tail of the left breast: Secondary | ICD-10-CM | POA: Diagnosis not present

## 2022-11-22 DIAGNOSIS — E876 Hypokalemia: Secondary | ICD-10-CM | POA: Diagnosis not present

## 2022-11-22 DIAGNOSIS — M542 Cervicalgia: Secondary | ICD-10-CM | POA: Diagnosis not present

## 2022-11-22 DIAGNOSIS — I10 Essential (primary) hypertension: Secondary | ICD-10-CM | POA: Diagnosis not present

## 2022-11-22 DIAGNOSIS — E785 Hyperlipidemia, unspecified: Secondary | ICD-10-CM | POA: Diagnosis not present

## 2022-11-22 DIAGNOSIS — H109 Unspecified conjunctivitis: Secondary | ICD-10-CM | POA: Diagnosis not present

## 2022-11-22 DIAGNOSIS — M1990 Unspecified osteoarthritis, unspecified site: Secondary | ICD-10-CM | POA: Diagnosis not present

## 2022-12-19 DIAGNOSIS — K219 Gastro-esophageal reflux disease without esophagitis: Secondary | ICD-10-CM | POA: Diagnosis not present

## 2022-12-19 DIAGNOSIS — M1990 Unspecified osteoarthritis, unspecified site: Secondary | ICD-10-CM | POA: Diagnosis not present

## 2022-12-19 DIAGNOSIS — E785 Hyperlipidemia, unspecified: Secondary | ICD-10-CM | POA: Diagnosis not present

## 2022-12-19 DIAGNOSIS — E039 Hypothyroidism, unspecified: Secondary | ICD-10-CM | POA: Diagnosis not present

## 2022-12-19 DIAGNOSIS — M62838 Other muscle spasm: Secondary | ICD-10-CM | POA: Diagnosis not present

## 2022-12-19 DIAGNOSIS — I10 Essential (primary) hypertension: Secondary | ICD-10-CM | POA: Diagnosis not present

## 2022-12-19 DIAGNOSIS — R7303 Prediabetes: Secondary | ICD-10-CM | POA: Diagnosis not present

## 2022-12-19 DIAGNOSIS — E559 Vitamin D deficiency, unspecified: Secondary | ICD-10-CM | POA: Diagnosis not present

## 2022-12-20 ENCOUNTER — Other Ambulatory Visit: Payer: Self-pay | Admitting: Internal Medicine

## 2022-12-20 DIAGNOSIS — Z1231 Encounter for screening mammogram for malignant neoplasm of breast: Secondary | ICD-10-CM

## 2022-12-20 DIAGNOSIS — I10 Essential (primary) hypertension: Secondary | ICD-10-CM | POA: Diagnosis not present

## 2022-12-23 DIAGNOSIS — E559 Vitamin D deficiency, unspecified: Secondary | ICD-10-CM | POA: Diagnosis not present

## 2022-12-23 DIAGNOSIS — E039 Hypothyroidism, unspecified: Secondary | ICD-10-CM | POA: Diagnosis not present

## 2022-12-23 DIAGNOSIS — E7849 Other hyperlipidemia: Secondary | ICD-10-CM | POA: Diagnosis not present

## 2022-12-23 DIAGNOSIS — E785 Hyperlipidemia, unspecified: Secondary | ICD-10-CM | POA: Diagnosis not present

## 2022-12-23 DIAGNOSIS — D518 Other vitamin B12 deficiency anemias: Secondary | ICD-10-CM | POA: Diagnosis not present

## 2022-12-23 DIAGNOSIS — E119 Type 2 diabetes mellitus without complications: Secondary | ICD-10-CM | POA: Diagnosis not present

## 2022-12-23 DIAGNOSIS — I10 Essential (primary) hypertension: Secondary | ICD-10-CM | POA: Diagnosis not present

## 2022-12-23 DIAGNOSIS — Z79899 Other long term (current) drug therapy: Secondary | ICD-10-CM | POA: Diagnosis not present

## 2022-12-23 DIAGNOSIS — F0390 Unspecified dementia without behavioral disturbance: Secondary | ICD-10-CM | POA: Diagnosis not present

## 2022-12-23 DIAGNOSIS — F419 Anxiety disorder, unspecified: Secondary | ICD-10-CM | POA: Diagnosis not present

## 2022-12-23 DIAGNOSIS — E782 Mixed hyperlipidemia: Secondary | ICD-10-CM | POA: Diagnosis not present

## 2022-12-23 DIAGNOSIS — E038 Other specified hypothyroidism: Secondary | ICD-10-CM | POA: Diagnosis not present

## 2023-01-19 DIAGNOSIS — I10 Essential (primary) hypertension: Secondary | ICD-10-CM | POA: Diagnosis not present

## 2023-01-22 DIAGNOSIS — E559 Vitamin D deficiency, unspecified: Secondary | ICD-10-CM | POA: Diagnosis not present

## 2023-01-22 DIAGNOSIS — E785 Hyperlipidemia, unspecified: Secondary | ICD-10-CM | POA: Diagnosis not present

## 2023-01-22 DIAGNOSIS — E7849 Other hyperlipidemia: Secondary | ICD-10-CM | POA: Diagnosis not present

## 2023-01-22 DIAGNOSIS — E039 Hypothyroidism, unspecified: Secondary | ICD-10-CM | POA: Diagnosis not present

## 2023-01-22 DIAGNOSIS — I10 Essential (primary) hypertension: Secondary | ICD-10-CM | POA: Diagnosis not present

## 2023-01-22 DIAGNOSIS — E119 Type 2 diabetes mellitus without complications: Secondary | ICD-10-CM | POA: Diagnosis not present

## 2023-01-22 DIAGNOSIS — D518 Other vitamin B12 deficiency anemias: Secondary | ICD-10-CM | POA: Diagnosis not present

## 2023-01-22 DIAGNOSIS — F0390 Unspecified dementia without behavioral disturbance: Secondary | ICD-10-CM | POA: Diagnosis not present

## 2023-01-22 DIAGNOSIS — E038 Other specified hypothyroidism: Secondary | ICD-10-CM | POA: Diagnosis not present

## 2023-01-22 DIAGNOSIS — F419 Anxiety disorder, unspecified: Secondary | ICD-10-CM | POA: Diagnosis not present

## 2023-01-22 DIAGNOSIS — E782 Mixed hyperlipidemia: Secondary | ICD-10-CM | POA: Diagnosis not present

## 2023-01-23 DIAGNOSIS — M25512 Pain in left shoulder: Secondary | ICD-10-CM | POA: Diagnosis not present

## 2023-01-23 DIAGNOSIS — R7303 Prediabetes: Secondary | ICD-10-CM | POA: Diagnosis not present

## 2023-01-23 DIAGNOSIS — I10 Essential (primary) hypertension: Secondary | ICD-10-CM | POA: Diagnosis not present

## 2023-01-23 DIAGNOSIS — E785 Hyperlipidemia, unspecified: Secondary | ICD-10-CM | POA: Diagnosis not present

## 2023-01-23 DIAGNOSIS — E039 Hypothyroidism, unspecified: Secondary | ICD-10-CM | POA: Diagnosis not present

## 2023-01-23 DIAGNOSIS — F0390 Unspecified dementia without behavioral disturbance: Secondary | ICD-10-CM | POA: Diagnosis not present

## 2023-01-23 DIAGNOSIS — M62838 Other muscle spasm: Secondary | ICD-10-CM | POA: Diagnosis not present

## 2023-01-23 DIAGNOSIS — K219 Gastro-esophageal reflux disease without esophagitis: Secondary | ICD-10-CM | POA: Diagnosis not present

## 2023-01-23 DIAGNOSIS — M1990 Unspecified osteoarthritis, unspecified site: Secondary | ICD-10-CM | POA: Diagnosis not present

## 2023-01-23 DIAGNOSIS — N644 Mastodynia: Secondary | ICD-10-CM | POA: Diagnosis not present

## 2023-01-23 DIAGNOSIS — M25511 Pain in right shoulder: Secondary | ICD-10-CM | POA: Diagnosis not present

## 2023-01-23 DIAGNOSIS — E876 Hypokalemia: Secondary | ICD-10-CM | POA: Diagnosis not present

## 2023-01-26 DIAGNOSIS — M25512 Pain in left shoulder: Secondary | ICD-10-CM | POA: Diagnosis not present

## 2023-01-26 DIAGNOSIS — M25511 Pain in right shoulder: Secondary | ICD-10-CM | POA: Diagnosis not present

## 2023-01-31 DIAGNOSIS — M6281 Muscle weakness (generalized): Secondary | ICD-10-CM | POA: Diagnosis not present

## 2023-02-03 DIAGNOSIS — M6281 Muscle weakness (generalized): Secondary | ICD-10-CM | POA: Diagnosis not present

## 2023-02-06 DIAGNOSIS — M6281 Muscle weakness (generalized): Secondary | ICD-10-CM | POA: Diagnosis not present

## 2023-02-10 DIAGNOSIS — M6281 Muscle weakness (generalized): Secondary | ICD-10-CM | POA: Diagnosis not present

## 2023-02-13 DIAGNOSIS — M6281 Muscle weakness (generalized): Secondary | ICD-10-CM | POA: Diagnosis not present

## 2023-02-17 DIAGNOSIS — M6281 Muscle weakness (generalized): Secondary | ICD-10-CM | POA: Diagnosis not present

## 2023-02-18 DIAGNOSIS — F0390 Unspecified dementia without behavioral disturbance: Secondary | ICD-10-CM | POA: Diagnosis not present

## 2023-02-18 DIAGNOSIS — E782 Mixed hyperlipidemia: Secondary | ICD-10-CM | POA: Diagnosis not present

## 2023-02-18 DIAGNOSIS — D518 Other vitamin B12 deficiency anemias: Secondary | ICD-10-CM | POA: Diagnosis not present

## 2023-02-18 DIAGNOSIS — F419 Anxiety disorder, unspecified: Secondary | ICD-10-CM | POA: Diagnosis not present

## 2023-02-18 DIAGNOSIS — E119 Type 2 diabetes mellitus without complications: Secondary | ICD-10-CM | POA: Diagnosis not present

## 2023-02-18 DIAGNOSIS — E038 Other specified hypothyroidism: Secondary | ICD-10-CM | POA: Diagnosis not present

## 2023-02-18 DIAGNOSIS — E559 Vitamin D deficiency, unspecified: Secondary | ICD-10-CM | POA: Diagnosis not present

## 2023-02-18 DIAGNOSIS — E7849 Other hyperlipidemia: Secondary | ICD-10-CM | POA: Diagnosis not present

## 2023-02-18 DIAGNOSIS — I10 Essential (primary) hypertension: Secondary | ICD-10-CM | POA: Diagnosis not present

## 2023-02-18 DIAGNOSIS — E785 Hyperlipidemia, unspecified: Secondary | ICD-10-CM | POA: Diagnosis not present

## 2023-02-18 DIAGNOSIS — E039 Hypothyroidism, unspecified: Secondary | ICD-10-CM | POA: Diagnosis not present

## 2023-02-18 DIAGNOSIS — M6281 Muscle weakness (generalized): Secondary | ICD-10-CM | POA: Diagnosis not present

## 2023-02-19 DIAGNOSIS — F0390 Unspecified dementia without behavioral disturbance: Secondary | ICD-10-CM | POA: Diagnosis not present

## 2023-02-19 DIAGNOSIS — E876 Hypokalemia: Secondary | ICD-10-CM | POA: Diagnosis not present

## 2023-02-19 DIAGNOSIS — M1990 Unspecified osteoarthritis, unspecified site: Secondary | ICD-10-CM | POA: Diagnosis not present

## 2023-02-19 DIAGNOSIS — R7303 Prediabetes: Secondary | ICD-10-CM | POA: Diagnosis not present

## 2023-02-19 DIAGNOSIS — I10 Essential (primary) hypertension: Secondary | ICD-10-CM | POA: Diagnosis not present

## 2023-02-19 DIAGNOSIS — K219 Gastro-esophageal reflux disease without esophagitis: Secondary | ICD-10-CM | POA: Diagnosis not present

## 2023-02-19 DIAGNOSIS — E039 Hypothyroidism, unspecified: Secondary | ICD-10-CM | POA: Diagnosis not present

## 2023-02-19 DIAGNOSIS — E785 Hyperlipidemia, unspecified: Secondary | ICD-10-CM | POA: Diagnosis not present

## 2023-02-19 DIAGNOSIS — N644 Mastodynia: Secondary | ICD-10-CM | POA: Diagnosis not present

## 2023-02-19 DIAGNOSIS — M62838 Other muscle spasm: Secondary | ICD-10-CM | POA: Diagnosis not present

## 2023-02-19 DIAGNOSIS — E559 Vitamin D deficiency, unspecified: Secondary | ICD-10-CM | POA: Diagnosis not present

## 2023-02-19 DIAGNOSIS — F419 Anxiety disorder, unspecified: Secondary | ICD-10-CM | POA: Diagnosis not present

## 2023-02-21 DIAGNOSIS — I10 Essential (primary) hypertension: Secondary | ICD-10-CM | POA: Diagnosis not present

## 2023-02-25 DIAGNOSIS — M6281 Muscle weakness (generalized): Secondary | ICD-10-CM | POA: Diagnosis not present

## 2023-02-28 DIAGNOSIS — M6281 Muscle weakness (generalized): Secondary | ICD-10-CM | POA: Diagnosis not present

## 2023-03-03 DIAGNOSIS — M6281 Muscle weakness (generalized): Secondary | ICD-10-CM | POA: Diagnosis not present

## 2023-03-06 DIAGNOSIS — M6281 Muscle weakness (generalized): Secondary | ICD-10-CM | POA: Diagnosis not present

## 2023-03-10 DIAGNOSIS — Z79899 Other long term (current) drug therapy: Secondary | ICD-10-CM | POA: Diagnosis not present

## 2023-03-10 DIAGNOSIS — E038 Other specified hypothyroidism: Secondary | ICD-10-CM | POA: Diagnosis not present

## 2023-03-10 DIAGNOSIS — E119 Type 2 diabetes mellitus without complications: Secondary | ICD-10-CM | POA: Diagnosis not present

## 2023-03-10 DIAGNOSIS — D519 Vitamin B12 deficiency anemia, unspecified: Secondary | ICD-10-CM | POA: Diagnosis not present

## 2023-03-10 DIAGNOSIS — E782 Mixed hyperlipidemia: Secondary | ICD-10-CM | POA: Diagnosis not present

## 2023-03-12 DIAGNOSIS — M6281 Muscle weakness (generalized): Secondary | ICD-10-CM | POA: Diagnosis not present

## 2023-03-14 DIAGNOSIS — M6281 Muscle weakness (generalized): Secondary | ICD-10-CM | POA: Diagnosis not present

## 2023-03-15 DIAGNOSIS — I739 Peripheral vascular disease, unspecified: Secondary | ICD-10-CM | POA: Diagnosis not present

## 2023-03-15 DIAGNOSIS — I7091 Generalized atherosclerosis: Secondary | ICD-10-CM | POA: Diagnosis not present

## 2023-03-22 DIAGNOSIS — E039 Hypothyroidism, unspecified: Secondary | ICD-10-CM | POA: Diagnosis not present

## 2023-03-22 DIAGNOSIS — F0390 Unspecified dementia without behavioral disturbance: Secondary | ICD-10-CM | POA: Diagnosis not present

## 2023-03-22 DIAGNOSIS — E785 Hyperlipidemia, unspecified: Secondary | ICD-10-CM | POA: Diagnosis not present

## 2023-03-22 DIAGNOSIS — E7849 Other hyperlipidemia: Secondary | ICD-10-CM | POA: Diagnosis not present

## 2023-03-22 DIAGNOSIS — D518 Other vitamin B12 deficiency anemias: Secondary | ICD-10-CM | POA: Diagnosis not present

## 2023-03-22 DIAGNOSIS — D519 Vitamin B12 deficiency anemia, unspecified: Secondary | ICD-10-CM | POA: Diagnosis not present

## 2023-03-22 DIAGNOSIS — E038 Other specified hypothyroidism: Secondary | ICD-10-CM | POA: Diagnosis not present

## 2023-03-22 DIAGNOSIS — E559 Vitamin D deficiency, unspecified: Secondary | ICD-10-CM | POA: Diagnosis not present

## 2023-03-22 DIAGNOSIS — F419 Anxiety disorder, unspecified: Secondary | ICD-10-CM | POA: Diagnosis not present

## 2023-03-22 DIAGNOSIS — E782 Mixed hyperlipidemia: Secondary | ICD-10-CM | POA: Diagnosis not present

## 2023-03-22 DIAGNOSIS — E119 Type 2 diabetes mellitus without complications: Secondary | ICD-10-CM | POA: Diagnosis not present

## 2023-03-22 DIAGNOSIS — I10 Essential (primary) hypertension: Secondary | ICD-10-CM | POA: Diagnosis not present

## 2023-03-23 DIAGNOSIS — I10 Essential (primary) hypertension: Secondary | ICD-10-CM | POA: Diagnosis not present

## 2023-03-24 DIAGNOSIS — M6281 Muscle weakness (generalized): Secondary | ICD-10-CM | POA: Diagnosis not present

## 2023-03-27 DIAGNOSIS — M6281 Muscle weakness (generalized): Secondary | ICD-10-CM | POA: Diagnosis not present

## 2023-03-28 ENCOUNTER — Ambulatory Visit
Admission: RE | Admit: 2023-03-28 | Discharge: 2023-03-28 | Disposition: A | Discharge status: Home / Self Care | Payer: PPO | Source: Ambulatory Visit | Attending: Internal Medicine | Admitting: Internal Medicine

## 2023-03-28 DIAGNOSIS — Z1231 Encounter for screening mammogram for malignant neoplasm of breast: Secondary | ICD-10-CM | POA: Insufficient documentation

## 2023-04-01 DIAGNOSIS — M6281 Muscle weakness (generalized): Secondary | ICD-10-CM | POA: Diagnosis not present

## 2023-04-03 ENCOUNTER — Other Ambulatory Visit: Payer: Self-pay | Admitting: Internal Medicine

## 2023-04-03 DIAGNOSIS — R928 Other abnormal and inconclusive findings on diagnostic imaging of breast: Secondary | ICD-10-CM

## 2023-04-07 ENCOUNTER — Encounter: Payer: Self-pay | Admitting: Adult Health

## 2023-04-07 ENCOUNTER — Ambulatory Visit
Admission: RE | Admit: 2023-04-07 | Discharge: 2023-04-07 | Disposition: A | Discharge status: Home / Self Care | Payer: PPO | Source: Ambulatory Visit | Attending: Internal Medicine | Admitting: Internal Medicine

## 2023-04-07 DIAGNOSIS — R928 Other abnormal and inconclusive findings on diagnostic imaging of breast: Secondary | ICD-10-CM

## 2023-04-07 DIAGNOSIS — N6321 Unspecified lump in the left breast, upper outer quadrant: Secondary | ICD-10-CM | POA: Diagnosis not present

## 2023-04-07 DIAGNOSIS — R92322 Mammographic fibroglandular density, left breast: Secondary | ICD-10-CM | POA: Diagnosis not present

## 2023-04-08 ENCOUNTER — Encounter: Payer: Self-pay | Admitting: Adult Health

## 2023-04-09 ENCOUNTER — Other Ambulatory Visit: Payer: Self-pay | Admitting: Adult Health

## 2023-04-09 DIAGNOSIS — R928 Other abnormal and inconclusive findings on diagnostic imaging of breast: Secondary | ICD-10-CM

## 2023-04-10 DIAGNOSIS — F419 Anxiety disorder, unspecified: Secondary | ICD-10-CM | POA: Diagnosis not present

## 2023-04-10 DIAGNOSIS — E039 Hypothyroidism, unspecified: Secondary | ICD-10-CM | POA: Diagnosis not present

## 2023-04-10 DIAGNOSIS — I10 Essential (primary) hypertension: Secondary | ICD-10-CM | POA: Diagnosis not present

## 2023-04-10 DIAGNOSIS — M62838 Other muscle spasm: Secondary | ICD-10-CM | POA: Diagnosis not present

## 2023-04-10 DIAGNOSIS — E785 Hyperlipidemia, unspecified: Secondary | ICD-10-CM | POA: Diagnosis not present

## 2023-04-10 DIAGNOSIS — E559 Vitamin D deficiency, unspecified: Secondary | ICD-10-CM | POA: Diagnosis not present

## 2023-04-10 DIAGNOSIS — K219 Gastro-esophageal reflux disease without esophagitis: Secondary | ICD-10-CM | POA: Diagnosis not present

## 2023-04-10 DIAGNOSIS — N644 Mastodynia: Secondary | ICD-10-CM | POA: Diagnosis not present

## 2023-04-10 DIAGNOSIS — M1990 Unspecified osteoarthritis, unspecified site: Secondary | ICD-10-CM | POA: Diagnosis not present

## 2023-04-14 ENCOUNTER — Ambulatory Visit
Admission: RE | Admit: 2023-04-14 | Discharge: 2023-04-14 | Disposition: A | Discharge status: Home / Self Care | Payer: PPO | Source: Ambulatory Visit | Attending: Adult Health | Admitting: Adult Health

## 2023-04-14 DIAGNOSIS — R928 Other abnormal and inconclusive findings on diagnostic imaging of breast: Secondary | ICD-10-CM | POA: Insufficient documentation

## 2023-04-14 DIAGNOSIS — N641 Fat necrosis of breast: Secondary | ICD-10-CM | POA: Insufficient documentation

## 2023-04-14 HISTORY — PX: BREAST BIOPSY: SHX20

## 2023-04-14 MED ORDER — LIDOCAINE-EPINEPHRINE 1 %-1:100000 IJ SOLN
8.0000 mL | Freq: Once | INTRAMUSCULAR | Status: AC
  Administered 2023-04-14: 8 mL
  Filled 2023-04-14: qty 8

## 2023-04-14 MED ORDER — LIDOCAINE 1 % OPTIME INJ - NO CHARGE
5.0000 mL | Freq: Once | INTRAMUSCULAR | Status: AC
  Administered 2023-04-14: 5 mL
  Filled 2023-04-14: qty 6

## 2023-04-15 LAB — SURGICAL PATHOLOGY

## 2023-04-17 DIAGNOSIS — F0390 Unspecified dementia without behavioral disturbance: Secondary | ICD-10-CM | POA: Diagnosis not present

## 2023-04-17 DIAGNOSIS — K219 Gastro-esophageal reflux disease without esophagitis: Secondary | ICD-10-CM | POA: Diagnosis not present

## 2023-04-17 DIAGNOSIS — J069 Acute upper respiratory infection, unspecified: Secondary | ICD-10-CM | POA: Diagnosis not present

## 2023-04-17 DIAGNOSIS — M62838 Other muscle spasm: Secondary | ICD-10-CM | POA: Diagnosis not present

## 2023-04-17 DIAGNOSIS — E876 Hypokalemia: Secondary | ICD-10-CM | POA: Diagnosis not present

## 2023-04-17 DIAGNOSIS — I10 Essential (primary) hypertension: Secondary | ICD-10-CM | POA: Diagnosis not present

## 2023-04-17 DIAGNOSIS — E785 Hyperlipidemia, unspecified: Secondary | ICD-10-CM | POA: Diagnosis not present

## 2023-04-17 DIAGNOSIS — M1990 Unspecified osteoarthritis, unspecified site: Secondary | ICD-10-CM | POA: Diagnosis not present

## 2023-04-17 DIAGNOSIS — Z20822 Contact with and (suspected) exposure to covid-19: Secondary | ICD-10-CM | POA: Diagnosis not present

## 2023-04-17 DIAGNOSIS — E039 Hypothyroidism, unspecified: Secondary | ICD-10-CM | POA: Diagnosis not present

## 2023-04-17 DIAGNOSIS — R7303 Prediabetes: Secondary | ICD-10-CM | POA: Diagnosis not present

## 2023-04-17 DIAGNOSIS — N644 Mastodynia: Secondary | ICD-10-CM | POA: Diagnosis not present

## 2023-04-17 DIAGNOSIS — E559 Vitamin D deficiency, unspecified: Secondary | ICD-10-CM | POA: Diagnosis not present

## 2023-04-22 DIAGNOSIS — E782 Mixed hyperlipidemia: Secondary | ICD-10-CM | POA: Diagnosis not present

## 2023-04-22 DIAGNOSIS — E785 Hyperlipidemia, unspecified: Secondary | ICD-10-CM | POA: Diagnosis not present

## 2023-04-22 DIAGNOSIS — E038 Other specified hypothyroidism: Secondary | ICD-10-CM | POA: Diagnosis not present

## 2023-04-22 DIAGNOSIS — F4323 Adjustment disorder with mixed anxiety and depressed mood: Secondary | ICD-10-CM | POA: Diagnosis not present

## 2023-04-22 DIAGNOSIS — E039 Hypothyroidism, unspecified: Secondary | ICD-10-CM | POA: Diagnosis not present

## 2023-04-22 DIAGNOSIS — I10 Essential (primary) hypertension: Secondary | ICD-10-CM | POA: Diagnosis not present

## 2023-04-22 DIAGNOSIS — D519 Vitamin B12 deficiency anemia, unspecified: Secondary | ICD-10-CM | POA: Diagnosis not present

## 2023-04-22 DIAGNOSIS — F419 Anxiety disorder, unspecified: Secondary | ICD-10-CM | POA: Diagnosis not present

## 2023-04-22 DIAGNOSIS — D518 Other vitamin B12 deficiency anemias: Secondary | ICD-10-CM | POA: Diagnosis not present

## 2023-04-22 DIAGNOSIS — F0390 Unspecified dementia without behavioral disturbance: Secondary | ICD-10-CM | POA: Diagnosis not present

## 2023-04-22 DIAGNOSIS — E559 Vitamin D deficiency, unspecified: Secondary | ICD-10-CM | POA: Diagnosis not present

## 2023-04-22 DIAGNOSIS — E119 Type 2 diabetes mellitus without complications: Secondary | ICD-10-CM | POA: Diagnosis not present

## 2023-04-22 DIAGNOSIS — E7849 Other hyperlipidemia: Secondary | ICD-10-CM | POA: Diagnosis not present

## 2023-04-23 DIAGNOSIS — I10 Essential (primary) hypertension: Secondary | ICD-10-CM | POA: Diagnosis not present

## 2023-05-16 DIAGNOSIS — E039 Hypothyroidism, unspecified: Secondary | ICD-10-CM | POA: Diagnosis not present

## 2023-05-16 DIAGNOSIS — E785 Hyperlipidemia, unspecified: Secondary | ICD-10-CM | POA: Diagnosis not present

## 2023-05-16 DIAGNOSIS — F419 Anxiety disorder, unspecified: Secondary | ICD-10-CM | POA: Diagnosis not present

## 2023-05-16 DIAGNOSIS — E119 Type 2 diabetes mellitus without complications: Secondary | ICD-10-CM | POA: Diagnosis not present

## 2023-05-16 DIAGNOSIS — I10 Essential (primary) hypertension: Secondary | ICD-10-CM | POA: Diagnosis not present

## 2023-05-16 DIAGNOSIS — E559 Vitamin D deficiency, unspecified: Secondary | ICD-10-CM | POA: Diagnosis not present

## 2023-05-16 DIAGNOSIS — D519 Vitamin B12 deficiency anemia, unspecified: Secondary | ICD-10-CM | POA: Diagnosis not present

## 2023-05-16 DIAGNOSIS — D518 Other vitamin B12 deficiency anemias: Secondary | ICD-10-CM | POA: Diagnosis not present

## 2023-05-16 DIAGNOSIS — E7849 Other hyperlipidemia: Secondary | ICD-10-CM | POA: Diagnosis not present

## 2023-05-16 DIAGNOSIS — E782 Mixed hyperlipidemia: Secondary | ICD-10-CM | POA: Diagnosis not present

## 2023-05-16 DIAGNOSIS — F0390 Unspecified dementia without behavioral disturbance: Secondary | ICD-10-CM | POA: Diagnosis not present

## 2023-05-16 DIAGNOSIS — E038 Other specified hypothyroidism: Secondary | ICD-10-CM | POA: Diagnosis not present

## 2023-05-19 DIAGNOSIS — F419 Anxiety disorder, unspecified: Secondary | ICD-10-CM | POA: Diagnosis not present

## 2023-05-19 DIAGNOSIS — F32A Depression, unspecified: Secondary | ICD-10-CM | POA: Diagnosis not present

## 2023-05-19 DIAGNOSIS — G309 Alzheimer's disease, unspecified: Secondary | ICD-10-CM | POA: Diagnosis not present

## 2023-05-22 DIAGNOSIS — K219 Gastro-esophageal reflux disease without esophagitis: Secondary | ICD-10-CM | POA: Diagnosis not present

## 2023-05-22 DIAGNOSIS — E039 Hypothyroidism, unspecified: Secondary | ICD-10-CM | POA: Diagnosis not present

## 2023-05-22 DIAGNOSIS — E785 Hyperlipidemia, unspecified: Secondary | ICD-10-CM | POA: Diagnosis not present

## 2023-05-22 DIAGNOSIS — M1990 Unspecified osteoarthritis, unspecified site: Secondary | ICD-10-CM | POA: Diagnosis not present

## 2023-05-22 DIAGNOSIS — E559 Vitamin D deficiency, unspecified: Secondary | ICD-10-CM | POA: Diagnosis not present

## 2023-05-22 DIAGNOSIS — I10 Essential (primary) hypertension: Secondary | ICD-10-CM | POA: Diagnosis not present

## 2023-05-22 DIAGNOSIS — E876 Hypokalemia: Secondary | ICD-10-CM | POA: Diagnosis not present

## 2023-05-22 DIAGNOSIS — R7303 Prediabetes: Secondary | ICD-10-CM | POA: Diagnosis not present

## 2023-05-22 DIAGNOSIS — F0390 Unspecified dementia without behavioral disturbance: Secondary | ICD-10-CM | POA: Diagnosis not present

## 2023-05-22 DIAGNOSIS — M62838 Other muscle spasm: Secondary | ICD-10-CM | POA: Diagnosis not present

## 2023-05-22 DIAGNOSIS — M25512 Pain in left shoulder: Secondary | ICD-10-CM | POA: Diagnosis not present

## 2023-05-22 DIAGNOSIS — F419 Anxiety disorder, unspecified: Secondary | ICD-10-CM | POA: Diagnosis not present

## 2023-05-23 DIAGNOSIS — I10 Essential (primary) hypertension: Secondary | ICD-10-CM | POA: Diagnosis not present

## 2023-05-23 DIAGNOSIS — M6281 Muscle weakness (generalized): Secondary | ICD-10-CM | POA: Diagnosis not present

## 2023-05-25 DIAGNOSIS — M25512 Pain in left shoulder: Secondary | ICD-10-CM | POA: Diagnosis not present

## 2023-05-29 DIAGNOSIS — M6281 Muscle weakness (generalized): Secondary | ICD-10-CM | POA: Diagnosis not present

## 2023-06-02 DIAGNOSIS — M6281 Muscle weakness (generalized): Secondary | ICD-10-CM | POA: Diagnosis not present

## 2023-06-03 DIAGNOSIS — G309 Alzheimer's disease, unspecified: Secondary | ICD-10-CM | POA: Diagnosis not present

## 2023-06-03 DIAGNOSIS — F419 Anxiety disorder, unspecified: Secondary | ICD-10-CM | POA: Diagnosis not present

## 2023-06-03 DIAGNOSIS — F32A Depression, unspecified: Secondary | ICD-10-CM | POA: Diagnosis not present

## 2023-06-03 DIAGNOSIS — F4323 Adjustment disorder with mixed anxiety and depressed mood: Secondary | ICD-10-CM | POA: Diagnosis not present

## 2023-06-04 DIAGNOSIS — G309 Alzheimer's disease, unspecified: Secondary | ICD-10-CM | POA: Diagnosis not present

## 2023-06-04 DIAGNOSIS — F419 Anxiety disorder, unspecified: Secondary | ICD-10-CM | POA: Diagnosis not present

## 2023-06-04 DIAGNOSIS — Z79899 Other long term (current) drug therapy: Secondary | ICD-10-CM | POA: Diagnosis not present

## 2023-06-04 DIAGNOSIS — F4323 Adjustment disorder with mixed anxiety and depressed mood: Secondary | ICD-10-CM | POA: Diagnosis not present

## 2023-06-04 DIAGNOSIS — F32A Depression, unspecified: Secondary | ICD-10-CM | POA: Diagnosis not present

## 2023-06-04 DIAGNOSIS — E038 Other specified hypothyroidism: Secondary | ICD-10-CM | POA: Diagnosis not present

## 2023-06-04 DIAGNOSIS — R7309 Other abnormal glucose: Secondary | ICD-10-CM | POA: Diagnosis not present

## 2023-06-04 DIAGNOSIS — D519 Vitamin B12 deficiency anemia, unspecified: Secondary | ICD-10-CM | POA: Diagnosis not present

## 2023-06-04 DIAGNOSIS — E782 Mixed hyperlipidemia: Secondary | ICD-10-CM | POA: Diagnosis not present

## 2023-06-08 DIAGNOSIS — F419 Anxiety disorder, unspecified: Secondary | ICD-10-CM | POA: Diagnosis not present

## 2023-06-08 DIAGNOSIS — G309 Alzheimer's disease, unspecified: Secondary | ICD-10-CM | POA: Diagnosis not present

## 2023-06-08 DIAGNOSIS — F4323 Adjustment disorder with mixed anxiety and depressed mood: Secondary | ICD-10-CM | POA: Diagnosis not present

## 2023-06-09 DIAGNOSIS — M6281 Muscle weakness (generalized): Secondary | ICD-10-CM | POA: Diagnosis not present

## 2023-06-09 DIAGNOSIS — Z79899 Other long term (current) drug therapy: Secondary | ICD-10-CM | POA: Diagnosis not present

## 2023-06-16 DIAGNOSIS — M6281 Muscle weakness (generalized): Secondary | ICD-10-CM | POA: Diagnosis not present

## 2023-06-17 DIAGNOSIS — I10 Essential (primary) hypertension: Secondary | ICD-10-CM | POA: Diagnosis not present

## 2023-06-17 DIAGNOSIS — M6281 Muscle weakness (generalized): Secondary | ICD-10-CM | POA: Diagnosis not present

## 2023-06-17 DIAGNOSIS — E559 Vitamin D deficiency, unspecified: Secondary | ICD-10-CM | POA: Diagnosis not present

## 2023-06-17 DIAGNOSIS — E782 Mixed hyperlipidemia: Secondary | ICD-10-CM | POA: Diagnosis not present

## 2023-06-17 DIAGNOSIS — D518 Other vitamin B12 deficiency anemias: Secondary | ICD-10-CM | POA: Diagnosis not present

## 2023-06-17 DIAGNOSIS — E038 Other specified hypothyroidism: Secondary | ICD-10-CM | POA: Diagnosis not present

## 2023-06-17 DIAGNOSIS — D519 Vitamin B12 deficiency anemia, unspecified: Secondary | ICD-10-CM | POA: Diagnosis not present

## 2023-06-17 DIAGNOSIS — E7849 Other hyperlipidemia: Secondary | ICD-10-CM | POA: Diagnosis not present

## 2023-06-17 DIAGNOSIS — E039 Hypothyroidism, unspecified: Secondary | ICD-10-CM | POA: Diagnosis not present

## 2023-06-17 DIAGNOSIS — F0390 Unspecified dementia without behavioral disturbance: Secondary | ICD-10-CM | POA: Diagnosis not present

## 2023-06-17 DIAGNOSIS — E785 Hyperlipidemia, unspecified: Secondary | ICD-10-CM | POA: Diagnosis not present

## 2023-06-17 DIAGNOSIS — E119 Type 2 diabetes mellitus without complications: Secondary | ICD-10-CM | POA: Diagnosis not present

## 2023-06-17 DIAGNOSIS — F419 Anxiety disorder, unspecified: Secondary | ICD-10-CM | POA: Diagnosis not present

## 2023-06-19 DIAGNOSIS — I10 Essential (primary) hypertension: Secondary | ICD-10-CM | POA: Diagnosis not present

## 2023-06-19 DIAGNOSIS — M1990 Unspecified osteoarthritis, unspecified site: Secondary | ICD-10-CM | POA: Diagnosis not present

## 2023-06-19 DIAGNOSIS — F0390 Unspecified dementia without behavioral disturbance: Secondary | ICD-10-CM | POA: Diagnosis not present

## 2023-06-19 DIAGNOSIS — E785 Hyperlipidemia, unspecified: Secondary | ICD-10-CM | POA: Diagnosis not present

## 2023-06-19 DIAGNOSIS — F419 Anxiety disorder, unspecified: Secondary | ICD-10-CM | POA: Diagnosis not present

## 2023-06-19 DIAGNOSIS — K219 Gastro-esophageal reflux disease without esophagitis: Secondary | ICD-10-CM | POA: Diagnosis not present

## 2023-06-19 DIAGNOSIS — E876 Hypokalemia: Secondary | ICD-10-CM | POA: Diagnosis not present

## 2023-06-19 DIAGNOSIS — M25512 Pain in left shoulder: Secondary | ICD-10-CM | POA: Diagnosis not present

## 2023-06-19 DIAGNOSIS — E559 Vitamin D deficiency, unspecified: Secondary | ICD-10-CM | POA: Diagnosis not present

## 2023-06-19 DIAGNOSIS — M62838 Other muscle spasm: Secondary | ICD-10-CM | POA: Diagnosis not present

## 2023-06-19 DIAGNOSIS — R7303 Prediabetes: Secondary | ICD-10-CM | POA: Diagnosis not present

## 2023-06-23 DIAGNOSIS — M6281 Muscle weakness (generalized): Secondary | ICD-10-CM | POA: Diagnosis not present

## 2023-06-25 DIAGNOSIS — M6281 Muscle weakness (generalized): Secondary | ICD-10-CM | POA: Diagnosis not present

## 2023-07-04 DIAGNOSIS — M6281 Muscle weakness (generalized): Secondary | ICD-10-CM | POA: Diagnosis not present

## 2023-07-05 DIAGNOSIS — M6281 Muscle weakness (generalized): Secondary | ICD-10-CM | POA: Diagnosis not present

## 2023-07-07 DIAGNOSIS — M6281 Muscle weakness (generalized): Secondary | ICD-10-CM | POA: Diagnosis not present

## 2023-07-07 DIAGNOSIS — R262 Difficulty in walking, not elsewhere classified: Secondary | ICD-10-CM | POA: Diagnosis not present

## 2023-07-09 DIAGNOSIS — M6281 Muscle weakness (generalized): Secondary | ICD-10-CM | POA: Diagnosis not present

## 2023-07-09 DIAGNOSIS — R262 Difficulty in walking, not elsewhere classified: Secondary | ICD-10-CM | POA: Diagnosis not present

## 2023-07-15 DIAGNOSIS — I7091 Generalized atherosclerosis: Secondary | ICD-10-CM | POA: Diagnosis not present

## 2023-07-17 DIAGNOSIS — R262 Difficulty in walking, not elsewhere classified: Secondary | ICD-10-CM | POA: Diagnosis not present

## 2023-07-17 DIAGNOSIS — M6281 Muscle weakness (generalized): Secondary | ICD-10-CM | POA: Diagnosis not present

## 2023-07-18 DIAGNOSIS — E038 Other specified hypothyroidism: Secondary | ICD-10-CM | POA: Diagnosis not present

## 2023-07-18 DIAGNOSIS — E7849 Other hyperlipidemia: Secondary | ICD-10-CM | POA: Diagnosis not present

## 2023-07-18 DIAGNOSIS — E782 Mixed hyperlipidemia: Secondary | ICD-10-CM | POA: Diagnosis not present

## 2023-07-18 DIAGNOSIS — E119 Type 2 diabetes mellitus without complications: Secondary | ICD-10-CM | POA: Diagnosis not present

## 2023-07-18 DIAGNOSIS — E559 Vitamin D deficiency, unspecified: Secondary | ICD-10-CM | POA: Diagnosis not present

## 2023-07-18 DIAGNOSIS — E785 Hyperlipidemia, unspecified: Secondary | ICD-10-CM | POA: Diagnosis not present

## 2023-07-18 DIAGNOSIS — I10 Essential (primary) hypertension: Secondary | ICD-10-CM | POA: Diagnosis not present

## 2023-07-18 DIAGNOSIS — D519 Vitamin B12 deficiency anemia, unspecified: Secondary | ICD-10-CM | POA: Diagnosis not present

## 2023-07-18 DIAGNOSIS — F419 Anxiety disorder, unspecified: Secondary | ICD-10-CM | POA: Diagnosis not present

## 2023-07-22 DIAGNOSIS — M6281 Muscle weakness (generalized): Secondary | ICD-10-CM | POA: Diagnosis not present

## 2023-07-22 DIAGNOSIS — R262 Difficulty in walking, not elsewhere classified: Secondary | ICD-10-CM | POA: Diagnosis not present

## 2023-07-25 DIAGNOSIS — M6281 Muscle weakness (generalized): Secondary | ICD-10-CM | POA: Diagnosis not present

## 2023-07-25 DIAGNOSIS — R262 Difficulty in walking, not elsewhere classified: Secondary | ICD-10-CM | POA: Diagnosis not present

## 2023-07-26 DIAGNOSIS — E038 Other specified hypothyroidism: Secondary | ICD-10-CM | POA: Diagnosis not present

## 2023-07-26 DIAGNOSIS — E7849 Other hyperlipidemia: Secondary | ICD-10-CM | POA: Diagnosis not present

## 2023-07-26 DIAGNOSIS — E119 Type 2 diabetes mellitus without complications: Secondary | ICD-10-CM | POA: Diagnosis not present

## 2023-07-26 DIAGNOSIS — E782 Mixed hyperlipidemia: Secondary | ICD-10-CM | POA: Diagnosis not present

## 2023-07-26 DIAGNOSIS — D519 Vitamin B12 deficiency anemia, unspecified: Secondary | ICD-10-CM | POA: Diagnosis not present

## 2023-07-26 DIAGNOSIS — I10 Essential (primary) hypertension: Secondary | ICD-10-CM | POA: Diagnosis not present

## 2023-07-26 DIAGNOSIS — F419 Anxiety disorder, unspecified: Secondary | ICD-10-CM | POA: Diagnosis not present

## 2023-07-26 DIAGNOSIS — E559 Vitamin D deficiency, unspecified: Secondary | ICD-10-CM | POA: Diagnosis not present

## 2023-07-26 DIAGNOSIS — E785 Hyperlipidemia, unspecified: Secondary | ICD-10-CM | POA: Diagnosis not present

## 2023-07-30 DIAGNOSIS — R262 Difficulty in walking, not elsewhere classified: Secondary | ICD-10-CM | POA: Diagnosis not present

## 2023-07-30 DIAGNOSIS — M6281 Muscle weakness (generalized): Secondary | ICD-10-CM | POA: Diagnosis not present

## 2023-07-31 DIAGNOSIS — K219 Gastro-esophageal reflux disease without esophagitis: Secondary | ICD-10-CM | POA: Diagnosis not present

## 2023-07-31 DIAGNOSIS — M1990 Unspecified osteoarthritis, unspecified site: Secondary | ICD-10-CM | POA: Diagnosis not present

## 2023-07-31 DIAGNOSIS — M62838 Other muscle spasm: Secondary | ICD-10-CM | POA: Diagnosis not present

## 2023-07-31 DIAGNOSIS — F419 Anxiety disorder, unspecified: Secondary | ICD-10-CM | POA: Diagnosis not present

## 2023-07-31 DIAGNOSIS — G4700 Insomnia, unspecified: Secondary | ICD-10-CM | POA: Diagnosis not present

## 2023-07-31 DIAGNOSIS — I959 Hypotension, unspecified: Secondary | ICD-10-CM | POA: Diagnosis not present

## 2023-07-31 DIAGNOSIS — E559 Vitamin D deficiency, unspecified: Secondary | ICD-10-CM | POA: Diagnosis not present

## 2023-07-31 DIAGNOSIS — E785 Hyperlipidemia, unspecified: Secondary | ICD-10-CM | POA: Diagnosis not present

## 2023-07-31 DIAGNOSIS — M25512 Pain in left shoulder: Secondary | ICD-10-CM | POA: Diagnosis not present

## 2023-08-01 DIAGNOSIS — R262 Difficulty in walking, not elsewhere classified: Secondary | ICD-10-CM | POA: Diagnosis not present

## 2023-08-01 DIAGNOSIS — M6281 Muscle weakness (generalized): Secondary | ICD-10-CM | POA: Diagnosis not present

## 2023-08-04 DIAGNOSIS — G309 Alzheimer's disease, unspecified: Secondary | ICD-10-CM | POA: Diagnosis not present

## 2023-08-04 DIAGNOSIS — R262 Difficulty in walking, not elsewhere classified: Secondary | ICD-10-CM | POA: Diagnosis not present

## 2023-08-04 DIAGNOSIS — M6281 Muscle weakness (generalized): Secondary | ICD-10-CM | POA: Diagnosis not present

## 2023-08-04 DIAGNOSIS — F0284 Dementia in other diseases classified elsewhere, unspecified severity, with anxiety: Secondary | ICD-10-CM | POA: Diagnosis not present

## 2023-08-04 DIAGNOSIS — F0283 Dementia in other diseases classified elsewhere, unspecified severity, with mood disturbance: Secondary | ICD-10-CM | POA: Diagnosis not present

## 2023-08-04 DIAGNOSIS — F32A Depression, unspecified: Secondary | ICD-10-CM | POA: Diagnosis not present

## 2023-08-06 DIAGNOSIS — M6281 Muscle weakness (generalized): Secondary | ICD-10-CM | POA: Diagnosis not present

## 2023-08-06 DIAGNOSIS — M5459 Other low back pain: Secondary | ICD-10-CM | POA: Diagnosis not present

## 2023-08-06 DIAGNOSIS — R262 Difficulty in walking, not elsewhere classified: Secondary | ICD-10-CM | POA: Diagnosis not present

## 2023-08-14 DIAGNOSIS — R262 Difficulty in walking, not elsewhere classified: Secondary | ICD-10-CM | POA: Diagnosis not present

## 2023-08-14 DIAGNOSIS — M6281 Muscle weakness (generalized): Secondary | ICD-10-CM | POA: Diagnosis not present

## 2023-08-14 DIAGNOSIS — M5459 Other low back pain: Secondary | ICD-10-CM | POA: Diagnosis not present

## 2023-08-22 DIAGNOSIS — M6281 Muscle weakness (generalized): Secondary | ICD-10-CM | POA: Diagnosis not present

## 2023-08-22 DIAGNOSIS — R262 Difficulty in walking, not elsewhere classified: Secondary | ICD-10-CM | POA: Diagnosis not present

## 2023-08-25 DIAGNOSIS — E119 Type 2 diabetes mellitus without complications: Secondary | ICD-10-CM | POA: Diagnosis not present

## 2023-08-25 DIAGNOSIS — E7849 Other hyperlipidemia: Secondary | ICD-10-CM | POA: Diagnosis not present

## 2023-08-25 DIAGNOSIS — E785 Hyperlipidemia, unspecified: Secondary | ICD-10-CM | POA: Diagnosis not present

## 2023-08-25 DIAGNOSIS — E038 Other specified hypothyroidism: Secondary | ICD-10-CM | POA: Diagnosis not present

## 2023-08-25 DIAGNOSIS — E559 Vitamin D deficiency, unspecified: Secondary | ICD-10-CM | POA: Diagnosis not present

## 2023-08-25 DIAGNOSIS — I10 Essential (primary) hypertension: Secondary | ICD-10-CM | POA: Diagnosis not present

## 2023-08-25 DIAGNOSIS — D519 Vitamin B12 deficiency anemia, unspecified: Secondary | ICD-10-CM | POA: Diagnosis not present

## 2023-08-25 DIAGNOSIS — E782 Mixed hyperlipidemia: Secondary | ICD-10-CM | POA: Diagnosis not present

## 2023-08-25 DIAGNOSIS — F419 Anxiety disorder, unspecified: Secondary | ICD-10-CM | POA: Diagnosis not present

## 2023-08-26 DIAGNOSIS — R262 Difficulty in walking, not elsewhere classified: Secondary | ICD-10-CM | POA: Diagnosis not present

## 2023-08-26 DIAGNOSIS — M5459 Other low back pain: Secondary | ICD-10-CM | POA: Diagnosis not present

## 2023-08-26 DIAGNOSIS — M6281 Muscle weakness (generalized): Secondary | ICD-10-CM | POA: Diagnosis not present

## 2023-08-28 DIAGNOSIS — M6281 Muscle weakness (generalized): Secondary | ICD-10-CM | POA: Diagnosis not present

## 2023-08-28 DIAGNOSIS — R262 Difficulty in walking, not elsewhere classified: Secondary | ICD-10-CM | POA: Diagnosis not present

## 2023-08-31 DIAGNOSIS — K219 Gastro-esophageal reflux disease without esophagitis: Secondary | ICD-10-CM | POA: Diagnosis not present

## 2023-08-31 DIAGNOSIS — E782 Mixed hyperlipidemia: Secondary | ICD-10-CM | POA: Diagnosis not present

## 2023-08-31 DIAGNOSIS — I10 Essential (primary) hypertension: Secondary | ICD-10-CM | POA: Diagnosis not present

## 2023-08-31 DIAGNOSIS — I2510 Atherosclerotic heart disease of native coronary artery without angina pectoris: Secondary | ICD-10-CM | POA: Diagnosis not present

## 2023-08-31 DIAGNOSIS — Z0000 Encounter for general adult medical examination without abnormal findings: Secondary | ICD-10-CM | POA: Diagnosis not present

## 2023-08-31 DIAGNOSIS — I739 Peripheral vascular disease, unspecified: Secondary | ICD-10-CM | POA: Diagnosis not present

## 2023-09-02 DIAGNOSIS — R262 Difficulty in walking, not elsewhere classified: Secondary | ICD-10-CM | POA: Diagnosis not present

## 2023-09-02 DIAGNOSIS — M6281 Muscle weakness (generalized): Secondary | ICD-10-CM | POA: Diagnosis not present

## 2023-09-04 DIAGNOSIS — R262 Difficulty in walking, not elsewhere classified: Secondary | ICD-10-CM | POA: Diagnosis not present

## 2023-09-04 DIAGNOSIS — E785 Hyperlipidemia, unspecified: Secondary | ICD-10-CM | POA: Diagnosis not present

## 2023-09-04 DIAGNOSIS — F0283 Dementia in other diseases classified elsewhere, unspecified severity, with mood disturbance: Secondary | ICD-10-CM | POA: Diagnosis not present

## 2023-09-04 DIAGNOSIS — K219 Gastro-esophageal reflux disease without esophagitis: Secondary | ICD-10-CM | POA: Diagnosis not present

## 2023-09-04 DIAGNOSIS — G4700 Insomnia, unspecified: Secondary | ICD-10-CM | POA: Diagnosis not present

## 2023-09-04 DIAGNOSIS — M25512 Pain in left shoulder: Secondary | ICD-10-CM | POA: Diagnosis not present

## 2023-09-04 DIAGNOSIS — E559 Vitamin D deficiency, unspecified: Secondary | ICD-10-CM | POA: Diagnosis not present

## 2023-09-04 DIAGNOSIS — M62838 Other muscle spasm: Secondary | ICD-10-CM | POA: Diagnosis not present

## 2023-09-04 DIAGNOSIS — F419 Anxiety disorder, unspecified: Secondary | ICD-10-CM | POA: Diagnosis not present

## 2023-09-04 DIAGNOSIS — G309 Alzheimer's disease, unspecified: Secondary | ICD-10-CM | POA: Diagnosis not present

## 2023-09-04 DIAGNOSIS — M1990 Unspecified osteoarthritis, unspecified site: Secondary | ICD-10-CM | POA: Diagnosis not present

## 2023-09-04 DIAGNOSIS — F0284 Dementia in other diseases classified elsewhere, unspecified severity, with anxiety: Secondary | ICD-10-CM | POA: Diagnosis not present

## 2023-09-04 DIAGNOSIS — I959 Hypotension, unspecified: Secondary | ICD-10-CM | POA: Diagnosis not present

## 2023-09-04 DIAGNOSIS — M6281 Muscle weakness (generalized): Secondary | ICD-10-CM | POA: Diagnosis not present

## 2023-09-04 DIAGNOSIS — F32A Depression, unspecified: Secondary | ICD-10-CM | POA: Diagnosis not present

## 2023-09-08 DIAGNOSIS — M5459 Other low back pain: Secondary | ICD-10-CM | POA: Diagnosis not present

## 2023-09-08 DIAGNOSIS — M6281 Muscle weakness (generalized): Secondary | ICD-10-CM | POA: Diagnosis not present

## 2023-09-08 DIAGNOSIS — R262 Difficulty in walking, not elsewhere classified: Secondary | ICD-10-CM | POA: Diagnosis not present

## 2023-09-11 DIAGNOSIS — M5459 Other low back pain: Secondary | ICD-10-CM | POA: Diagnosis not present

## 2023-09-11 DIAGNOSIS — F02B3 Dementia in other diseases classified elsewhere, moderate, with mood disturbance: Secondary | ICD-10-CM | POA: Diagnosis not present

## 2023-09-11 DIAGNOSIS — M6281 Muscle weakness (generalized): Secondary | ICD-10-CM | POA: Diagnosis not present

## 2023-09-11 DIAGNOSIS — G309 Alzheimer's disease, unspecified: Secondary | ICD-10-CM | POA: Diagnosis not present

## 2023-09-11 DIAGNOSIS — R262 Difficulty in walking, not elsewhere classified: Secondary | ICD-10-CM | POA: Diagnosis not present

## 2023-09-11 DIAGNOSIS — F02B4 Dementia in other diseases classified elsewhere, moderate, with anxiety: Secondary | ICD-10-CM | POA: Diagnosis not present

## 2023-09-11 DIAGNOSIS — F32A Depression, unspecified: Secondary | ICD-10-CM | POA: Diagnosis not present

## 2023-09-12 DIAGNOSIS — R601 Generalized edema: Secondary | ICD-10-CM | POA: Diagnosis not present

## 2023-09-15 DIAGNOSIS — E038 Other specified hypothyroidism: Secondary | ICD-10-CM | POA: Diagnosis not present

## 2023-09-15 DIAGNOSIS — E782 Mixed hyperlipidemia: Secondary | ICD-10-CM | POA: Diagnosis not present

## 2023-09-15 DIAGNOSIS — D519 Vitamin B12 deficiency anemia, unspecified: Secondary | ICD-10-CM | POA: Diagnosis not present

## 2023-09-15 DIAGNOSIS — R7309 Other abnormal glucose: Secondary | ICD-10-CM | POA: Diagnosis not present

## 2023-09-15 DIAGNOSIS — Z79899 Other long term (current) drug therapy: Secondary | ICD-10-CM | POA: Diagnosis not present

## 2023-09-16 DIAGNOSIS — M5459 Other low back pain: Secondary | ICD-10-CM | POA: Diagnosis not present

## 2023-09-16 DIAGNOSIS — M6281 Muscle weakness (generalized): Secondary | ICD-10-CM | POA: Diagnosis not present

## 2023-09-16 DIAGNOSIS — R262 Difficulty in walking, not elsewhere classified: Secondary | ICD-10-CM | POA: Diagnosis not present

## 2023-09-18 DIAGNOSIS — R0982 Postnasal drip: Secondary | ICD-10-CM | POA: Diagnosis not present

## 2023-09-18 DIAGNOSIS — E559 Vitamin D deficiency, unspecified: Secondary | ICD-10-CM | POA: Diagnosis not present

## 2023-09-18 DIAGNOSIS — M25512 Pain in left shoulder: Secondary | ICD-10-CM | POA: Diagnosis not present

## 2023-09-18 DIAGNOSIS — I959 Hypotension, unspecified: Secondary | ICD-10-CM | POA: Diagnosis not present

## 2023-09-18 DIAGNOSIS — E876 Hypokalemia: Secondary | ICD-10-CM | POA: Diagnosis not present

## 2023-09-18 DIAGNOSIS — M1990 Unspecified osteoarthritis, unspecified site: Secondary | ICD-10-CM | POA: Diagnosis not present

## 2023-09-18 DIAGNOSIS — G4700 Insomnia, unspecified: Secondary | ICD-10-CM | POA: Diagnosis not present

## 2023-09-18 DIAGNOSIS — E785 Hyperlipidemia, unspecified: Secondary | ICD-10-CM | POA: Diagnosis not present

## 2023-09-18 DIAGNOSIS — M62838 Other muscle spasm: Secondary | ICD-10-CM | POA: Diagnosis not present

## 2023-09-18 DIAGNOSIS — K219 Gastro-esophageal reflux disease without esophagitis: Secondary | ICD-10-CM | POA: Diagnosis not present

## 2023-09-18 DIAGNOSIS — F419 Anxiety disorder, unspecified: Secondary | ICD-10-CM | POA: Diagnosis not present

## 2023-09-18 DIAGNOSIS — J029 Acute pharyngitis, unspecified: Secondary | ICD-10-CM | POA: Diagnosis not present

## 2023-09-19 DIAGNOSIS — J029 Acute pharyngitis, unspecified: Secondary | ICD-10-CM | POA: Diagnosis not present

## 2023-09-19 DIAGNOSIS — R0982 Postnasal drip: Secondary | ICD-10-CM | POA: Diagnosis not present

## 2023-09-24 DIAGNOSIS — Z20828 Contact with and (suspected) exposure to other viral communicable diseases: Secondary | ICD-10-CM | POA: Diagnosis not present

## 2023-09-24 DIAGNOSIS — R6883 Chills (without fever): Secondary | ICD-10-CM | POA: Diagnosis not present

## 2023-09-29 DIAGNOSIS — E782 Mixed hyperlipidemia: Secondary | ICD-10-CM | POA: Diagnosis not present

## 2023-09-29 DIAGNOSIS — Z79899 Other long term (current) drug therapy: Secondary | ICD-10-CM | POA: Diagnosis not present

## 2023-09-29 DIAGNOSIS — R7309 Other abnormal glucose: Secondary | ICD-10-CM | POA: Diagnosis not present

## 2023-09-29 DIAGNOSIS — D519 Vitamin B12 deficiency anemia, unspecified: Secondary | ICD-10-CM | POA: Diagnosis not present

## 2023-09-30 DIAGNOSIS — R262 Difficulty in walking, not elsewhere classified: Secondary | ICD-10-CM | POA: Diagnosis not present

## 2023-09-30 DIAGNOSIS — M5459 Other low back pain: Secondary | ICD-10-CM | POA: Diagnosis not present

## 2023-09-30 DIAGNOSIS — M6281 Muscle weakness (generalized): Secondary | ICD-10-CM | POA: Diagnosis not present

## 2023-10-02 DIAGNOSIS — E785 Hyperlipidemia, unspecified: Secondary | ICD-10-CM | POA: Diagnosis not present

## 2023-10-02 DIAGNOSIS — F419 Anxiety disorder, unspecified: Secondary | ICD-10-CM | POA: Diagnosis not present

## 2023-10-02 DIAGNOSIS — E559 Vitamin D deficiency, unspecified: Secondary | ICD-10-CM | POA: Diagnosis not present

## 2023-10-02 DIAGNOSIS — G309 Alzheimer's disease, unspecified: Secondary | ICD-10-CM | POA: Diagnosis not present

## 2023-10-02 DIAGNOSIS — M25512 Pain in left shoulder: Secondary | ICD-10-CM | POA: Diagnosis not present

## 2023-10-02 DIAGNOSIS — F02B4 Dementia in other diseases classified elsewhere, moderate, with anxiety: Secondary | ICD-10-CM | POA: Diagnosis not present

## 2023-10-02 DIAGNOSIS — F411 Generalized anxiety disorder: Secondary | ICD-10-CM | POA: Diagnosis not present

## 2023-10-02 DIAGNOSIS — G4700 Insomnia, unspecified: Secondary | ICD-10-CM | POA: Diagnosis not present

## 2023-10-02 DIAGNOSIS — K219 Gastro-esophageal reflux disease without esophagitis: Secondary | ICD-10-CM | POA: Diagnosis not present

## 2023-10-02 DIAGNOSIS — J069 Acute upper respiratory infection, unspecified: Secondary | ICD-10-CM | POA: Diagnosis not present

## 2023-10-02 DIAGNOSIS — M1990 Unspecified osteoarthritis, unspecified site: Secondary | ICD-10-CM | POA: Diagnosis not present

## 2023-10-02 DIAGNOSIS — F32A Depression, unspecified: Secondary | ICD-10-CM | POA: Diagnosis not present

## 2023-10-02 DIAGNOSIS — F02B3 Dementia in other diseases classified elsewhere, moderate, with mood disturbance: Secondary | ICD-10-CM | POA: Diagnosis not present

## 2023-10-07 DIAGNOSIS — M6281 Muscle weakness (generalized): Secondary | ICD-10-CM | POA: Diagnosis not present

## 2023-10-07 DIAGNOSIS — M5459 Other low back pain: Secondary | ICD-10-CM | POA: Diagnosis not present

## 2023-10-07 DIAGNOSIS — R262 Difficulty in walking, not elsewhere classified: Secondary | ICD-10-CM | POA: Diagnosis not present

## 2023-10-09 DIAGNOSIS — M5459 Other low back pain: Secondary | ICD-10-CM | POA: Diagnosis not present

## 2023-10-09 DIAGNOSIS — M6281 Muscle weakness (generalized): Secondary | ICD-10-CM | POA: Diagnosis not present

## 2023-10-09 DIAGNOSIS — R262 Difficulty in walking, not elsewhere classified: Secondary | ICD-10-CM | POA: Diagnosis not present

## 2023-10-13 DIAGNOSIS — D519 Vitamin B12 deficiency anemia, unspecified: Secondary | ICD-10-CM | POA: Diagnosis not present

## 2023-10-13 DIAGNOSIS — Z79899 Other long term (current) drug therapy: Secondary | ICD-10-CM | POA: Diagnosis not present

## 2023-10-13 DIAGNOSIS — E782 Mixed hyperlipidemia: Secondary | ICD-10-CM | POA: Diagnosis not present

## 2023-10-13 DIAGNOSIS — R7309 Other abnormal glucose: Secondary | ICD-10-CM | POA: Diagnosis not present

## 2023-10-14 DIAGNOSIS — M6281 Muscle weakness (generalized): Secondary | ICD-10-CM | POA: Diagnosis not present

## 2023-10-14 DIAGNOSIS — M5459 Other low back pain: Secondary | ICD-10-CM | POA: Diagnosis not present

## 2023-10-14 DIAGNOSIS — R262 Difficulty in walking, not elsewhere classified: Secondary | ICD-10-CM | POA: Diagnosis not present

## 2023-10-16 DIAGNOSIS — M5459 Other low back pain: Secondary | ICD-10-CM | POA: Diagnosis not present

## 2023-10-16 DIAGNOSIS — R262 Difficulty in walking, not elsewhere classified: Secondary | ICD-10-CM | POA: Diagnosis not present

## 2023-10-16 DIAGNOSIS — M6281 Muscle weakness (generalized): Secondary | ICD-10-CM | POA: Diagnosis not present

## 2023-10-21 DIAGNOSIS — M6281 Muscle weakness (generalized): Secondary | ICD-10-CM | POA: Diagnosis not present

## 2023-10-21 DIAGNOSIS — M5459 Other low back pain: Secondary | ICD-10-CM | POA: Diagnosis not present

## 2023-10-21 DIAGNOSIS — I7091 Generalized atherosclerosis: Secondary | ICD-10-CM | POA: Diagnosis not present

## 2023-10-21 DIAGNOSIS — R262 Difficulty in walking, not elsewhere classified: Secondary | ICD-10-CM | POA: Diagnosis not present

## 2023-10-22 DIAGNOSIS — E785 Hyperlipidemia, unspecified: Secondary | ICD-10-CM | POA: Diagnosis not present

## 2023-10-22 DIAGNOSIS — E7849 Other hyperlipidemia: Secondary | ICD-10-CM | POA: Diagnosis not present

## 2023-10-22 DIAGNOSIS — E119 Type 2 diabetes mellitus without complications: Secondary | ICD-10-CM | POA: Diagnosis not present

## 2023-10-22 DIAGNOSIS — E559 Vitamin D deficiency, unspecified: Secondary | ICD-10-CM | POA: Diagnosis not present

## 2023-10-22 DIAGNOSIS — I10 Essential (primary) hypertension: Secondary | ICD-10-CM | POA: Diagnosis not present

## 2023-10-22 DIAGNOSIS — E782 Mixed hyperlipidemia: Secondary | ICD-10-CM | POA: Diagnosis not present

## 2023-10-22 DIAGNOSIS — D519 Vitamin B12 deficiency anemia, unspecified: Secondary | ICD-10-CM | POA: Diagnosis not present

## 2023-10-22 DIAGNOSIS — E038 Other specified hypothyroidism: Secondary | ICD-10-CM | POA: Diagnosis not present

## 2023-10-23 DIAGNOSIS — R262 Difficulty in walking, not elsewhere classified: Secondary | ICD-10-CM | POA: Diagnosis not present

## 2023-10-23 DIAGNOSIS — M5459 Other low back pain: Secondary | ICD-10-CM | POA: Diagnosis not present

## 2023-10-23 DIAGNOSIS — M6281 Muscle weakness (generalized): Secondary | ICD-10-CM | POA: Diagnosis not present

## 2023-10-28 DIAGNOSIS — M5459 Other low back pain: Secondary | ICD-10-CM | POA: Diagnosis not present

## 2023-10-28 DIAGNOSIS — M6281 Muscle weakness (generalized): Secondary | ICD-10-CM | POA: Diagnosis not present

## 2023-10-28 DIAGNOSIS — R262 Difficulty in walking, not elsewhere classified: Secondary | ICD-10-CM | POA: Diagnosis not present

## 2023-10-30 DIAGNOSIS — E038 Other specified hypothyroidism: Secondary | ICD-10-CM | POA: Diagnosis not present

## 2023-10-30 DIAGNOSIS — E782 Mixed hyperlipidemia: Secondary | ICD-10-CM | POA: Diagnosis not present

## 2023-10-30 DIAGNOSIS — M1990 Unspecified osteoarthritis, unspecified site: Secondary | ICD-10-CM | POA: Diagnosis not present

## 2023-10-30 DIAGNOSIS — K219 Gastro-esophageal reflux disease without esophagitis: Secondary | ICD-10-CM | POA: Diagnosis not present

## 2023-10-30 DIAGNOSIS — I10 Essential (primary) hypertension: Secondary | ICD-10-CM | POA: Diagnosis not present

## 2023-10-30 DIAGNOSIS — G4700 Insomnia, unspecified: Secondary | ICD-10-CM | POA: Diagnosis not present

## 2023-10-30 DIAGNOSIS — E119 Type 2 diabetes mellitus without complications: Secondary | ICD-10-CM | POA: Diagnosis not present

## 2023-10-30 DIAGNOSIS — D519 Vitamin B12 deficiency anemia, unspecified: Secondary | ICD-10-CM | POA: Diagnosis not present

## 2023-10-30 DIAGNOSIS — E559 Vitamin D deficiency, unspecified: Secondary | ICD-10-CM | POA: Diagnosis not present

## 2023-10-30 DIAGNOSIS — F32A Depression, unspecified: Secondary | ICD-10-CM | POA: Diagnosis not present

## 2023-10-30 DIAGNOSIS — E7849 Other hyperlipidemia: Secondary | ICD-10-CM | POA: Diagnosis not present

## 2023-10-30 DIAGNOSIS — M25512 Pain in left shoulder: Secondary | ICD-10-CM | POA: Diagnosis not present

## 2023-10-30 DIAGNOSIS — F419 Anxiety disorder, unspecified: Secondary | ICD-10-CM | POA: Diagnosis not present

## 2023-10-30 DIAGNOSIS — E785 Hyperlipidemia, unspecified: Secondary | ICD-10-CM | POA: Diagnosis not present

## 2023-11-04 DIAGNOSIS — M6281 Muscle weakness (generalized): Secondary | ICD-10-CM | POA: Diagnosis not present

## 2023-11-04 DIAGNOSIS — R262 Difficulty in walking, not elsewhere classified: Secondary | ICD-10-CM | POA: Diagnosis not present

## 2023-11-04 DIAGNOSIS — M5459 Other low back pain: Secondary | ICD-10-CM | POA: Diagnosis not present

## 2023-11-11 DIAGNOSIS — H40153 Residual stage of open-angle glaucoma, bilateral: Secondary | ICD-10-CM | POA: Diagnosis not present

## 2023-11-11 DIAGNOSIS — R262 Difficulty in walking, not elsewhere classified: Secondary | ICD-10-CM | POA: Diagnosis not present

## 2023-11-11 DIAGNOSIS — M5459 Other low back pain: Secondary | ICD-10-CM | POA: Diagnosis not present

## 2023-11-11 DIAGNOSIS — M6281 Muscle weakness (generalized): Secondary | ICD-10-CM | POA: Diagnosis not present

## 2023-11-18 DIAGNOSIS — G309 Alzheimer's disease, unspecified: Secondary | ICD-10-CM | POA: Diagnosis not present

## 2023-11-18 DIAGNOSIS — F02B4 Dementia in other diseases classified elsewhere, moderate, with anxiety: Secondary | ICD-10-CM | POA: Diagnosis not present

## 2023-11-18 DIAGNOSIS — F411 Generalized anxiety disorder: Secondary | ICD-10-CM | POA: Diagnosis not present

## 2023-11-18 DIAGNOSIS — F02B3 Dementia in other diseases classified elsewhere, moderate, with mood disturbance: Secondary | ICD-10-CM | POA: Diagnosis not present

## 2023-12-01 DIAGNOSIS — E7849 Other hyperlipidemia: Secondary | ICD-10-CM | POA: Diagnosis not present

## 2023-12-01 DIAGNOSIS — E785 Hyperlipidemia, unspecified: Secondary | ICD-10-CM | POA: Diagnosis not present

## 2023-12-01 DIAGNOSIS — D519 Vitamin B12 deficiency anemia, unspecified: Secondary | ICD-10-CM | POA: Diagnosis not present

## 2023-12-01 DIAGNOSIS — I10 Essential (primary) hypertension: Secondary | ICD-10-CM | POA: Diagnosis not present

## 2023-12-01 DIAGNOSIS — Z961 Presence of intraocular lens: Secondary | ICD-10-CM | POA: Diagnosis not present

## 2023-12-01 DIAGNOSIS — H401131 Primary open-angle glaucoma, bilateral, mild stage: Secondary | ICD-10-CM | POA: Diagnosis not present

## 2023-12-01 DIAGNOSIS — E119 Type 2 diabetes mellitus without complications: Secondary | ICD-10-CM | POA: Diagnosis not present

## 2023-12-01 DIAGNOSIS — E559 Vitamin D deficiency, unspecified: Secondary | ICD-10-CM | POA: Diagnosis not present

## 2023-12-01 DIAGNOSIS — E782 Mixed hyperlipidemia: Secondary | ICD-10-CM | POA: Diagnosis not present

## 2023-12-01 DIAGNOSIS — H26491 Other secondary cataract, right eye: Secondary | ICD-10-CM | POA: Diagnosis not present

## 2023-12-01 DIAGNOSIS — F419 Anxiety disorder, unspecified: Secondary | ICD-10-CM | POA: Diagnosis not present

## 2023-12-01 DIAGNOSIS — E038 Other specified hypothyroidism: Secondary | ICD-10-CM | POA: Diagnosis not present

## 2023-12-08 DIAGNOSIS — E782 Mixed hyperlipidemia: Secondary | ICD-10-CM | POA: Diagnosis not present

## 2023-12-08 DIAGNOSIS — R7309 Other abnormal glucose: Secondary | ICD-10-CM | POA: Diagnosis not present

## 2023-12-08 DIAGNOSIS — Z79899 Other long term (current) drug therapy: Secondary | ICD-10-CM | POA: Diagnosis not present

## 2023-12-08 DIAGNOSIS — E559 Vitamin D deficiency, unspecified: Secondary | ICD-10-CM | POA: Diagnosis not present

## 2023-12-08 DIAGNOSIS — E038 Other specified hypothyroidism: Secondary | ICD-10-CM | POA: Diagnosis not present

## 2023-12-08 DIAGNOSIS — D519 Vitamin B12 deficiency anemia, unspecified: Secondary | ICD-10-CM | POA: Diagnosis not present

## 2023-12-18 DIAGNOSIS — G4700 Insomnia, unspecified: Secondary | ICD-10-CM | POA: Diagnosis not present

## 2023-12-18 DIAGNOSIS — M25512 Pain in left shoulder: Secondary | ICD-10-CM | POA: Diagnosis not present

## 2023-12-18 DIAGNOSIS — E559 Vitamin D deficiency, unspecified: Secondary | ICD-10-CM | POA: Diagnosis not present

## 2023-12-18 DIAGNOSIS — G309 Alzheimer's disease, unspecified: Secondary | ICD-10-CM | POA: Diagnosis not present

## 2023-12-18 DIAGNOSIS — E785 Hyperlipidemia, unspecified: Secondary | ICD-10-CM | POA: Diagnosis not present

## 2023-12-18 DIAGNOSIS — F02B4 Dementia in other diseases classified elsewhere, moderate, with anxiety: Secondary | ICD-10-CM | POA: Diagnosis not present

## 2023-12-18 DIAGNOSIS — F02B3 Dementia in other diseases classified elsewhere, moderate, with mood disturbance: Secondary | ICD-10-CM | POA: Diagnosis not present

## 2023-12-18 DIAGNOSIS — M1990 Unspecified osteoarthritis, unspecified site: Secondary | ICD-10-CM | POA: Diagnosis not present

## 2023-12-18 DIAGNOSIS — F411 Generalized anxiety disorder: Secondary | ICD-10-CM | POA: Diagnosis not present

## 2023-12-29 DIAGNOSIS — H26492 Other secondary cataract, left eye: Secondary | ICD-10-CM | POA: Diagnosis not present

## 2024-01-08 DIAGNOSIS — F32A Depression, unspecified: Secondary | ICD-10-CM | POA: Diagnosis not present

## 2024-01-08 DIAGNOSIS — M25512 Pain in left shoulder: Secondary | ICD-10-CM | POA: Diagnosis not present

## 2024-01-08 DIAGNOSIS — G4700 Insomnia, unspecified: Secondary | ICD-10-CM | POA: Diagnosis not present

## 2024-01-08 DIAGNOSIS — E785 Hyperlipidemia, unspecified: Secondary | ICD-10-CM | POA: Diagnosis not present

## 2024-01-08 DIAGNOSIS — J069 Acute upper respiratory infection, unspecified: Secondary | ICD-10-CM | POA: Diagnosis not present

## 2024-01-08 DIAGNOSIS — R0981 Nasal congestion: Secondary | ICD-10-CM | POA: Diagnosis not present

## 2024-01-08 DIAGNOSIS — E559 Vitamin D deficiency, unspecified: Secondary | ICD-10-CM | POA: Diagnosis not present

## 2024-01-08 DIAGNOSIS — F419 Anxiety disorder, unspecified: Secondary | ICD-10-CM | POA: Diagnosis not present

## 2024-01-15 DIAGNOSIS — F419 Anxiety disorder, unspecified: Secondary | ICD-10-CM | POA: Diagnosis not present

## 2024-01-15 DIAGNOSIS — F32A Depression, unspecified: Secondary | ICD-10-CM | POA: Diagnosis not present

## 2024-01-15 DIAGNOSIS — M1990 Unspecified osteoarthritis, unspecified site: Secondary | ICD-10-CM | POA: Diagnosis not present

## 2024-01-15 DIAGNOSIS — E785 Hyperlipidemia, unspecified: Secondary | ICD-10-CM | POA: Diagnosis not present

## 2024-01-15 DIAGNOSIS — K219 Gastro-esophageal reflux disease without esophagitis: Secondary | ICD-10-CM | POA: Diagnosis not present

## 2024-01-15 DIAGNOSIS — M25512 Pain in left shoulder: Secondary | ICD-10-CM | POA: Diagnosis not present

## 2024-01-15 DIAGNOSIS — E559 Vitamin D deficiency, unspecified: Secondary | ICD-10-CM | POA: Diagnosis not present

## 2024-01-15 DIAGNOSIS — G4700 Insomnia, unspecified: Secondary | ICD-10-CM | POA: Diagnosis not present

## 2024-01-15 DIAGNOSIS — J329 Chronic sinusitis, unspecified: Secondary | ICD-10-CM | POA: Diagnosis not present

## 2024-01-16 DIAGNOSIS — F02B3 Dementia in other diseases classified elsewhere, moderate, with mood disturbance: Secondary | ICD-10-CM | POA: Diagnosis not present

## 2024-01-16 DIAGNOSIS — G309 Alzheimer's disease, unspecified: Secondary | ICD-10-CM | POA: Diagnosis not present

## 2024-01-16 DIAGNOSIS — F02B4 Dementia in other diseases classified elsewhere, moderate, with anxiety: Secondary | ICD-10-CM | POA: Diagnosis not present

## 2024-01-16 DIAGNOSIS — F411 Generalized anxiety disorder: Secondary | ICD-10-CM | POA: Diagnosis not present

## 2024-01-21 DIAGNOSIS — E559 Vitamin D deficiency, unspecified: Secondary | ICD-10-CM | POA: Diagnosis not present

## 2024-01-21 DIAGNOSIS — F419 Anxiety disorder, unspecified: Secondary | ICD-10-CM | POA: Diagnosis not present

## 2024-01-21 DIAGNOSIS — I10 Essential (primary) hypertension: Secondary | ICD-10-CM | POA: Diagnosis not present

## 2024-01-21 DIAGNOSIS — E119 Type 2 diabetes mellitus without complications: Secondary | ICD-10-CM | POA: Diagnosis not present

## 2024-01-21 DIAGNOSIS — D519 Vitamin B12 deficiency anemia, unspecified: Secondary | ICD-10-CM | POA: Diagnosis not present

## 2024-01-21 DIAGNOSIS — E785 Hyperlipidemia, unspecified: Secondary | ICD-10-CM | POA: Diagnosis not present

## 2024-01-21 DIAGNOSIS — E7849 Other hyperlipidemia: Secondary | ICD-10-CM | POA: Diagnosis not present

## 2024-01-21 DIAGNOSIS — E038 Other specified hypothyroidism: Secondary | ICD-10-CM | POA: Diagnosis not present

## 2024-01-21 DIAGNOSIS — E782 Mixed hyperlipidemia: Secondary | ICD-10-CM | POA: Diagnosis not present

## 2024-01-26 DIAGNOSIS — F419 Anxiety disorder, unspecified: Secondary | ICD-10-CM | POA: Diagnosis not present

## 2024-01-26 DIAGNOSIS — E785 Hyperlipidemia, unspecified: Secondary | ICD-10-CM | POA: Diagnosis not present

## 2024-01-26 DIAGNOSIS — E559 Vitamin D deficiency, unspecified: Secondary | ICD-10-CM | POA: Diagnosis not present

## 2024-01-26 DIAGNOSIS — E7849 Other hyperlipidemia: Secondary | ICD-10-CM | POA: Diagnosis not present

## 2024-01-26 DIAGNOSIS — I10 Essential (primary) hypertension: Secondary | ICD-10-CM | POA: Diagnosis not present

## 2024-01-26 DIAGNOSIS — E119 Type 2 diabetes mellitus without complications: Secondary | ICD-10-CM | POA: Diagnosis not present

## 2024-01-26 DIAGNOSIS — D519 Vitamin B12 deficiency anemia, unspecified: Secondary | ICD-10-CM | POA: Diagnosis not present

## 2024-01-26 DIAGNOSIS — E782 Mixed hyperlipidemia: Secondary | ICD-10-CM | POA: Diagnosis not present

## 2024-01-26 DIAGNOSIS — E038 Other specified hypothyroidism: Secondary | ICD-10-CM | POA: Diagnosis not present

## 2024-02-13 DIAGNOSIS — F02B4 Dementia in other diseases classified elsewhere, moderate, with anxiety: Secondary | ICD-10-CM | POA: Diagnosis not present

## 2024-02-13 DIAGNOSIS — G309 Alzheimer's disease, unspecified: Secondary | ICD-10-CM | POA: Diagnosis not present

## 2024-02-13 DIAGNOSIS — F02B3 Dementia in other diseases classified elsewhere, moderate, with mood disturbance: Secondary | ICD-10-CM | POA: Diagnosis not present

## 2024-02-13 DIAGNOSIS — F411 Generalized anxiety disorder: Secondary | ICD-10-CM | POA: Diagnosis not present

## 2024-02-26 DIAGNOSIS — E785 Hyperlipidemia, unspecified: Secondary | ICD-10-CM | POA: Diagnosis not present

## 2024-02-26 DIAGNOSIS — E038 Other specified hypothyroidism: Secondary | ICD-10-CM | POA: Diagnosis not present

## 2024-02-26 DIAGNOSIS — M25512 Pain in left shoulder: Secondary | ICD-10-CM | POA: Diagnosis not present

## 2024-02-26 DIAGNOSIS — M1990 Unspecified osteoarthritis, unspecified site: Secondary | ICD-10-CM | POA: Diagnosis not present

## 2024-02-26 DIAGNOSIS — E559 Vitamin D deficiency, unspecified: Secondary | ICD-10-CM | POA: Diagnosis not present

## 2024-02-26 DIAGNOSIS — G4700 Insomnia, unspecified: Secondary | ICD-10-CM | POA: Diagnosis not present

## 2024-02-26 DIAGNOSIS — F32A Depression, unspecified: Secondary | ICD-10-CM | POA: Diagnosis not present

## 2024-02-26 DIAGNOSIS — F419 Anxiety disorder, unspecified: Secondary | ICD-10-CM | POA: Diagnosis not present

## 2024-02-26 DIAGNOSIS — K219 Gastro-esophageal reflux disease without esophagitis: Secondary | ICD-10-CM | POA: Diagnosis not present

## 2024-03-01 DIAGNOSIS — E782 Mixed hyperlipidemia: Secondary | ICD-10-CM | POA: Diagnosis not present

## 2024-03-01 DIAGNOSIS — Z79899 Other long term (current) drug therapy: Secondary | ICD-10-CM | POA: Diagnosis not present

## 2024-03-01 DIAGNOSIS — E038 Other specified hypothyroidism: Secondary | ICD-10-CM | POA: Diagnosis not present

## 2024-03-01 DIAGNOSIS — R7309 Other abnormal glucose: Secondary | ICD-10-CM | POA: Diagnosis not present

## 2024-03-01 DIAGNOSIS — D519 Vitamin B12 deficiency anemia, unspecified: Secondary | ICD-10-CM | POA: Diagnosis not present
